# Patient Record
Sex: Male | Born: 1980 | Race: Black or African American | Hispanic: No | Marital: Married | Smoking: Never smoker
Health system: Southern US, Community
[De-identification: ages and names within clinical notes are randomized; demographics above are authoritative.]

## PROBLEM LIST (undated history)

## (undated) DIAGNOSIS — T7840XA Allergy, unspecified, initial encounter: Secondary | ICD-10-CM

## (undated) HISTORY — DX: Allergy, unspecified, initial encounter: T78.40XA

## (undated) HISTORY — PX: NECK SURGERY: SHX720

---

## 2006-09-23 ENCOUNTER — Emergency Department (HOSPITAL_COMMUNITY): Admission: EM | Admit: 2006-09-23 | Discharge: 2006-09-23 | Payer: Self-pay | Admitting: Emergency Medicine

## 2006-10-22 ENCOUNTER — Emergency Department (HOSPITAL_COMMUNITY): Admission: EM | Admit: 2006-10-22 | Discharge: 2006-10-22 | Payer: Self-pay | Admitting: Emergency Medicine

## 2008-01-15 ENCOUNTER — Emergency Department (HOSPITAL_COMMUNITY): Admission: EM | Admit: 2008-01-15 | Discharge: 2008-01-15 | Payer: Self-pay | Admitting: Emergency Medicine

## 2009-09-27 ENCOUNTER — Emergency Department (HOSPITAL_COMMUNITY)
Admission: EM | Admit: 2009-09-27 | Discharge: 2009-09-27 | Payer: Self-pay | Source: Home / Self Care | Admitting: Emergency Medicine

## 2010-10-19 ENCOUNTER — Emergency Department (HOSPITAL_COMMUNITY)
Admission: EM | Admit: 2010-10-19 | Discharge: 2010-10-19 | Payer: Self-pay | Source: Home / Self Care | Admitting: Emergency Medicine

## 2010-10-19 LAB — CBC
HCT: 42 % (ref 39.0–52.0)
Hemoglobin: 15.6 g/dL (ref 13.0–17.0)
Platelets: 170 10*3/uL (ref 150–400)
WBC: 10.3 10*3/uL (ref 4.0–10.5)

## 2010-10-19 LAB — BASIC METABOLIC PANEL
BUN: 10 mg/dL (ref 6–23)
CO2: 24 mEq/L (ref 19–32)
Calcium: 8.8 mg/dL (ref 8.4–10.5)
Creatinine, Ser: 0.93 mg/dL (ref 0.4–1.5)

## 2010-10-19 LAB — DIFFERENTIAL
Basophils Absolute: 0 10*3/uL (ref 0.0–0.1)
Eosinophils Relative: 0 % (ref 0–5)
Lymphocytes Relative: 8 % — ABNORMAL LOW (ref 12–46)
Monocytes Relative: 7 % (ref 3–12)

## 2010-12-10 LAB — CBC
MCHC: 34 g/dL (ref 30.0–36.0)
MCV: 88.5 fL (ref 78.0–100.0)
Platelets: 163 10*3/uL (ref 150–400)
WBC: 9.8 10*3/uL (ref 4.0–10.5)

## 2010-12-10 LAB — BASIC METABOLIC PANEL
BUN: 13 mg/dL (ref 6–23)
CO2: 22 mEq/L (ref 19–32)
Chloride: 102 mEq/L (ref 96–112)
Creatinine, Ser: 0.92 mg/dL (ref 0.4–1.5)
GFR calc Af Amer: >60 mL/min (ref >60)
Glucose, Bld: 128 mg/dL — ABNORMAL HIGH (ref 70–99)
Potassium: 3.9 mEq/L (ref 3.5–5.1)

## 2010-12-10 LAB — DIFFERENTIAL
Basophils Absolute: 0 10*3/uL (ref 0.0–0.1)
Basophils Relative: 0 % (ref 0–1)
Eosinophils Absolute: 0 10*3/uL (ref 0.0–0.7)
Eosinophils Relative: 0 % (ref 0–5)
Lymphs Abs: 0.4 10*3/uL — ABNORMAL LOW (ref 0.7–4.0)
Neutro Abs: 8.9 10*3/uL — ABNORMAL HIGH (ref 1.7–7.7)

## 2012-07-28 ENCOUNTER — Encounter (HOSPITAL_COMMUNITY): Payer: Self-pay | Admitting: Emergency Medicine

## 2012-07-28 DIAGNOSIS — M5380 Other specified dorsopathies, site unspecified: Secondary | ICD-10-CM | POA: Insufficient documentation

## 2012-07-28 NOTE — ED Notes (Signed)
PT. REPORTS UPPER / MID BACK PAIN ONSET THIS SAT. , STATES INJURED HIS BACK WHILE LIFTING FURNITURES .

## 2012-07-29 ENCOUNTER — Emergency Department (HOSPITAL_COMMUNITY)
Admission: EM | Admit: 2012-07-29 | Discharge: 2012-07-29 | Disposition: A | Discharge status: Home / Self Care | Payer: BC Managed Care – PPO | Attending: Emergency Medicine | Admitting: Emergency Medicine

## 2012-07-29 DIAGNOSIS — M62830 Muscle spasm of back: Secondary | ICD-10-CM

## 2012-07-29 MED ORDER — KETOROLAC TROMETHAMINE 30 MG/ML IJ SOLN
30.0000 mg | Freq: Once | INTRAMUSCULAR | Status: AC
  Administered 2012-07-29: 30 mg via INTRAMUSCULAR
  Filled 2012-07-29: qty 1

## 2012-07-29 MED ORDER — DIAZEPAM 5 MG PO TABS: 5.0000 mg | ORAL_TABLET | Freq: Four times a day (QID) | ORAL | Status: DC | PRN

## 2012-07-29 MED ORDER — IBUPROFEN 600 MG PO TABS: 600.0000 mg | ORAL_TABLET | Freq: Four times a day (QID) | ORAL | Status: DC | PRN

## 2012-07-29 MED ORDER — DIAZEPAM 5 MG PO TABS
5.0000 mg | ORAL_TABLET | Freq: Once | ORAL | Status: AC
  Administered 2012-07-29: 5 mg via ORAL
  Filled 2012-07-29: qty 1

## 2012-07-29 NOTE — ED Notes (Signed)
The patient is AOx4 and comfortable with his discharge instructions. 

## 2012-07-29 NOTE — ED Provider Notes (Signed)
Medical screening examination/treatment/procedure(s) were performed by non-physician practitioner and as supervising physician I was immediately available for consultation/collaboration.    Vida Roller, MD 07/29/12 [phone]

## 2012-07-29 NOTE — ED Provider Notes (Signed)
History     CSN: [id number]  Arrival date & time 07/28/12  2326   First MD Initiated Contact with Patient 07/29/12 [phone]      Chief Complaint  Patient presents with  . Back Pain    (Consider location/radiation/quality/duration/timing/severity/associated sxs/prior treatment) HPI Comments: Patient states, that after moving furniture on Saturday, he developed upper back pain across his shoulder blades and mid back.  Has been persistent.  He is been taking over-the-counter ibuprofen, with no relief.  Denies any previous injury of back pain, shortness of breath, nausea, dizziness, cardiac arrhythmias  Patient is a 31 y.o. male presenting with back pain. The history is provided by the patient.  Back Pain  This is a new problem. The current episode started 2 days ago. The problem occurs constantly. The problem has not changed since onset.The pain is associated with lifting heavy objects. The pain is present in the thoracic spine. The pain does not radiate. The pain is at a severity of 8/10. The pain is moderate. The symptoms are aggravated by certain positions. Pertinent negatives include no chest pain, no fever, no numbness and no weakness.    History reviewed. No pertinent past medical history.  History reviewed. No pertinent past surgical history.  No family history on file.  History  Substance Use Topics  . Smoking status: Never Smoker   . Smokeless tobacco: Not on file  . Alcohol Use: No      Review of Systems  Constitutional: Negative for fever and chills.  HENT: Negative for congestion.   Respiratory: Negative for shortness of breath.   Cardiovascular: Negative for chest pain.  Gastrointestinal: Negative for nausea.  Musculoskeletal: Positive for back pain.  Skin: Negative for rash and wound.  Neurological: Negative for dizziness, weakness and numbness.    Allergies  Review of patient's allergies indicates no known allergies.  Home Medications   Current Outpatient  Rx  Name  Route  Sig  Dispense  Refill  . ADVIL PO   Oral   Take 2 tablets by mouth every 6 (six) hours as needed. For back pain         . DIAZEPAM 5 MG PO TABS   Oral   Take 1 tablet (5 mg total) by mouth every 6 (six) hours as needed for anxiety.   20 tablet   0   . IBUPROFEN 600 MG PO TABS   Oral   Take 1 tablet (600 mg total) by mouth every 6 (six) hours as needed for pain.   30 tablet   0     BP 117/70  Pulse 64  Temp 97.8 F (36.6 C) (Oral)  Resp 16  SpO2 99%  Physical Exam  Constitutional: He is oriented to person, place, and time. He appears well-developed and well-nourished.  Eyes: Pupils are equal, round, and reactive to light.  Cardiovascular: Normal rate.   Pulmonary/Chest: Effort normal.  Musculoskeletal: Normal range of motion. He exhibits tenderness. He exhibits no edema.  Neurological: He is alert and oriented to person, place, and time.  Skin: Skin is warm. No rash noted. No erythema.    ED Course  Procedures (including critical care time)  Labs Reviewed - No data to display No results found.   1. Muscle spasm of back       MDM   Patient had a severe.  Muscle spasm.  We'll treat with Valium, and anti-inflammatories        Arman Filter, NP 07/29/12 [phone]

## 2012-09-10 ENCOUNTER — Encounter (HOSPITAL_COMMUNITY): Payer: Self-pay | Admitting: *Deleted

## 2012-09-10 ENCOUNTER — Emergency Department (HOSPITAL_COMMUNITY)
Admission: EM | Admit: 2012-09-10 | Discharge: 2012-09-10 | Disposition: A | Discharge status: Home / Self Care | Payer: BC Managed Care – PPO | Attending: Emergency Medicine | Admitting: Emergency Medicine

## 2012-09-10 DIAGNOSIS — B9789 Other viral agents as the cause of diseases classified elsewhere: Secondary | ICD-10-CM | POA: Insufficient documentation

## 2012-09-10 DIAGNOSIS — R05 Cough: Secondary | ICD-10-CM | POA: Insufficient documentation

## 2012-09-10 DIAGNOSIS — R6883 Chills (without fever): Secondary | ICD-10-CM | POA: Insufficient documentation

## 2012-09-10 DIAGNOSIS — R51 Headache: Secondary | ICD-10-CM | POA: Insufficient documentation

## 2012-09-10 DIAGNOSIS — B349 Viral infection, unspecified: Secondary | ICD-10-CM

## 2012-09-10 DIAGNOSIS — R52 Pain, unspecified: Secondary | ICD-10-CM | POA: Insufficient documentation

## 2012-09-10 DIAGNOSIS — R059 Cough, unspecified: Secondary | ICD-10-CM | POA: Insufficient documentation

## 2012-09-10 DIAGNOSIS — IMO0001 Reserved for inherently not codable concepts without codable children: Secondary | ICD-10-CM | POA: Insufficient documentation

## 2012-09-10 DIAGNOSIS — J029 Acute pharyngitis, unspecified: Secondary | ICD-10-CM | POA: Insufficient documentation

## 2012-09-10 MED ORDER — ONDANSETRON HCL 4 MG PO TABS: 4.0000 mg | ORAL_TABLET | Freq: Four times a day (QID) | ORAL | Status: DC

## 2012-09-10 MED ORDER — SODIUM CHLORIDE 0.9 % IV BOLUS (SEPSIS)
1000.0000 mL | Freq: Once | INTRAVENOUS | Status: AC
  Administered 2012-09-10: 1000 mL via INTRAVENOUS

## 2012-09-10 MED ORDER — KETOROLAC TROMETHAMINE 30 MG/ML IJ SOLN
30.0000 mg | Freq: Once | INTRAMUSCULAR | Status: AC
  Administered 2012-09-10: 30 mg via INTRAVENOUS
  Filled 2012-09-10: qty 1

## 2012-09-10 MED ORDER — ONDANSETRON HCL 4 MG/2ML IJ SOLN
4.0000 mg | Freq: Once | INTRAMUSCULAR | Status: AC
  Administered 2012-09-10: 4 mg via INTRAVENOUS
  Filled 2012-09-10: qty 2

## 2012-09-10 MED ORDER — ACETAMINOPHEN 325 MG PO TABS
650.0000 mg | ORAL_TABLET | Freq: Once | ORAL | Status: AC
  Administered 2012-09-10: 650 mg via ORAL
  Filled 2012-09-10: qty 2

## 2012-09-10 MED ORDER — MORPHINE SULFATE 4 MG/ML IJ SOLN
2.0000 mg | Freq: Once | INTRAMUSCULAR | Status: AC
  Administered 2012-09-10: 2 mg via INTRAVENOUS
  Filled 2012-09-10: qty 1

## 2012-09-10 NOTE — ED Provider Notes (Signed)
History     CSN: [id number]  Arrival date & time 09/10/12  0108   First MD Initiated Contact with Patient 09/10/12 0216      Chief Complaint  Patient presents with  . Emesis  . Generalized Body Aches    (Consider location/radiation/quality/duration/timing/severity/associated sxs/prior treatment) HPI Zachary Fischer is a 31 y.o. male who presents to the Emergency Department complaining of chills, cough, headache, sore throat, nausea and one episide of vomiting that began tonight. He also reports body aches and pains. Denies fever, shortness of breath. Has taken no medicines.   History reviewed. No pertinent past medical history.  History reviewed. No pertinent past surgical history.  History reviewed. No pertinent family history.  History  Substance Use Topics  . Smoking status: Never Smoker   . Smokeless tobacco: Not on file  . Alcohol Use: No      Review of Systems  Constitutional: Positive for chills. Negative for fever.       10 Systems reviewed and are negative for acute change except as noted in the HPI.  HENT: Negative for congestion.   Eyes: Negative for discharge and redness.  Respiratory: Negative for cough and shortness of breath.   Cardiovascular: Negative for chest pain.  Gastrointestinal: Positive for nausea and vomiting. Negative for abdominal pain.  Musculoskeletal: Positive for myalgias. Negative for back pain.  Skin: Negative for rash.  Neurological: Positive for headaches. Negative for syncope and numbness.  Psychiatric/Behavioral:       No behavior change.    Allergies  Review of patient's allergies indicates no known allergies.  Home Medications   Current Outpatient Rx  Name  Route  Sig  Dispense  Refill  . DIAZEPAM 5 MG PO TABS   Oral   Take 1 tablet (5 mg total) by mouth every 6 (six) hours as needed for anxiety.   20 tablet   0   . ADVIL PO   Oral   Take 2 tablets by mouth every 6 (six) hours as needed. For back pain          . IBUPROFEN 600 MG PO TABS   Oral   Take 1 tablet (600 mg total) by mouth every 6 (six) hours as needed for pain.   30 tablet   0     BP 90/40  Pulse 102  Temp 100.3 F (37.9 C) (Oral)  Resp 20  Ht 5\' 8"  (1.727 m)  Wt 150 lb (68.04 kg)  BMI 22.81 kg/m2  SpO2 99%  Physical Exam  Nursing note and vitals reviewed. Constitutional: He appears well-developed and well-nourished.       Awake, alert, nontoxic appearance.  HENT:  Head: Atraumatic.  Eyes: Right eye exhibits no discharge. Left eye exhibits no discharge.  Neck: Neck supple.  Cardiovascular: Normal heart sounds and intact distal pulses.   Pulmonary/Chest: Effort normal and breath sounds normal. He exhibits no tenderness.  Abdominal: Soft. Bowel sounds are normal. There is no tenderness. There is no rebound.  Musculoskeletal: He exhibits no tenderness.       Baseline ROM, no obvious new focal weakness.  Neurological:       Mental status and motor strength appears baseline for patient and situation.  Skin: No rash noted.  Psychiatric: He has a normal mood and affect.    ED Course  Procedures (including critical care time)    1. Viral illness       MDM  Patient presents with myalgias, headache, nausea, vomiting, chills. Given IVF,  antiinflammatory, antiemetic, analgesic with improvement. Able to take PO fluids and a snack. Pt feels improved after observation and/or treatment in ED.Pt stable in ED with no significant deterioration in condition.The patient appears reasonably screened and/or stabilized for discharge and I doubt any other medical condition or other [facility] requiring further screening, evaluation, or treatment in the ED at this time prior to discharge.  MDM Reviewed: nursing note and vitals           Nicoletta Dress. Colon Branch, MD 09/11/12 2337

## 2012-09-10 NOTE — ED Notes (Signed)
Gave patient ginger ale to drink as requested and ordered by MD.

## 2012-09-10 NOTE — ED Notes (Signed)
Pt reporting generalized body aches, cold chills, and vomiting beginning last night.  Symptoms progressing though day.  Pt has been treating at home with Nyquil and Ibuprofen.

## 2013-05-29 ENCOUNTER — Encounter: Payer: Self-pay | Admitting: Family Medicine

## 2013-05-29 ENCOUNTER — Ambulatory Visit (INDEPENDENT_AMBULATORY_CARE_PROVIDER_SITE_OTHER): Payer: BC Managed Care – PPO | Admitting: Family Medicine

## 2013-05-29 VITALS — BP 100/70 | HR 68 | Temp 97.0°F | Resp 18 | Ht 66.0 in | Wt 162.0 lb

## 2013-05-29 DIAGNOSIS — Z0000 Encounter for general adult medical examination without abnormal findings: Secondary | ICD-10-CM

## 2013-05-29 LAB — COMPREHENSIVE METABOLIC PANEL
ALT: 30 U/L (ref 0–53)
AST: 36 U/L (ref 0–37)
Albumin: 4.6 g/dL (ref 3.5–5.2)
Alkaline Phosphatase: 65 U/L (ref 39–117)
CO2: 26 mEq/L (ref 19–32)
Creat: 1 mg/dL (ref 0.50–1.35)
Glucose, Bld: 77 mg/dL (ref 70–99)
Total Bilirubin: 0.5 mg/dL (ref 0.3–1.2)
Total Protein: 7.1 g/dL (ref 6.0–8.3)

## 2013-05-29 LAB — CBC WITH DIFFERENTIAL/PLATELET
Basophils Absolute: 0 10*3/uL (ref 0.0–0.1)
Basophils Relative: 0 % (ref 0–1)
Eosinophils Relative: 3 % (ref 0–5)
Lymphs Abs: 1.3 10*3/uL (ref 0.7–4.0)
MCHC: 35.5 g/dL (ref 30.0–36.0)
MCV: 86 fL (ref 78.0–100.0)
RBC: 4.72 MIL/uL (ref 4.22–5.81)
RDW: 14 % (ref 11.5–15.5)
WBC: 5.1 10*3/uL (ref 4.0–10.5)

## 2013-05-29 LAB — LIPID PANEL
LDL Cholesterol: 95 mg/dL (ref 0–99)
Total CHOL/HDL Ratio: 3.4 Ratio
Triglycerides: 49 mg/dL (ref <150)
VLDL: 10 mg/dL (ref 0–40)

## 2013-05-29 NOTE — Progress Notes (Signed)
  Subjective:    Patient ID: Zachary Fischer, male    DOB: Jun 18, 1981, 32 y.o.   MRN: [id number]  HPI  Pt here to establish care and for complete for physical exam. No previous PCP. TDAP 2009 from [employer]  Works as Financial planner [employer] History reviewed Due for fasting labs Occasionally gets bilat knee pain, no injury, overuse in [employer], no swelling, uses OTC meds if needed  Review of Systems   GEN- denies fatigue, fever, weight loss,weakness, recent illness HEENT- denies eye drainage, change in vision, nasal discharge, CVS- denies chest pain, palpitations RESP- denies SOB, cough, wheeze ABD- denies N/V, change in stools, abd pain GU- denies dysuria, hematuria, dribbling, incontinence MSK- denies joint pain, muscle aches, injury Neuro- denies headache, dizziness, syncope, seizure activity      Objective:   Physical Exam  GEN- NAD, alert and oriented x3 HEENT- PERRL, EOMI, non injected sclera, pink conjunctiva, MMM, oropharynx clear, TM clear bilat, no effusion Neck- Supple, no LAD CVS- RRR, no murmur RESP-CTAB ABD-NABS,soft,NT,ND EXT- No edema MSK- FROM upper and lower ext, mild crepitus bilat knees, no effusion NEURO-CNII-XII in tact, no focal deficits Pulses- Radial, DP- 2+ Psych- normal affect and mood      Assessment & Plan:   CPE- Physical done, Fasting labs, Immunizations UTD,RTC as needed

## 2013-05-29 NOTE — Patient Instructions (Signed)
I recommend eye visit once a year I recommend dental visit every 6 months Goal is to  Exercise 30 minutes 5 days a week We will send a letter with lab results  F/U 1 year or as needed  

## 2014-06-11 ENCOUNTER — Other Ambulatory Visit (INDEPENDENT_AMBULATORY_CARE_PROVIDER_SITE_OTHER): Payer: BC Managed Care – PPO

## 2014-06-11 DIAGNOSIS — Z111 Encounter for screening for respiratory tuberculosis: Secondary | ICD-10-CM

## 2014-06-11 DIAGNOSIS — Z0000 Encounter for general adult medical examination without abnormal findings: Secondary | ICD-10-CM

## 2014-06-11 LAB — CBC WITH DIFFERENTIAL/PLATELET
BASOS ABS: 0 10*3/uL (ref 0.0–0.1)
BASOS PCT: 0 % (ref 0–1)
EOS ABS: 0.1 10*3/uL (ref 0.0–0.7)
EOS PCT: 3 % (ref 0–5)
HCT: 41.5 % (ref 39.0–52.0)
Hemoglobin: 14.5 g/dL (ref 13.0–17.0)
LYMPHS PCT: 27 % (ref 12–46)
Lymphs Abs: 1.2 10*3/uL (ref 0.7–4.0)
MCH: 29.8 pg (ref 26.0–34.0)
MCHC: 34.9 g/dL (ref 30.0–36.0)
MCV: 85.4 fL (ref 78.0–100.0)
MONO ABS: 0.5 10*3/uL (ref 0.1–1.0)
Monocytes Relative: 11 % (ref 3–12)
NEUTROS PCT: 59 % (ref 43–77)
Neutro Abs: 2.7 10*3/uL (ref 1.7–7.7)
Platelets: 166 10*3/uL (ref 150–400)
RBC: 4.86 MIL/uL (ref 4.22–5.81)
RDW: 13.8 % (ref 11.5–15.5)
WBC: 4.5 10*3/uL (ref 4.0–10.5)

## 2014-06-11 LAB — COMPLETE METABOLIC PANEL WITH GFR
ALT: 21 U/L (ref 0–53)
AST: 18 U/L (ref 0–37)
Albumin: 4.3 g/dL (ref 3.5–5.2)
Alkaline Phosphatase: 75 U/L (ref 39–117)
BILIRUBIN TOTAL: 0.4 mg/dL (ref 0.2–1.2)
BUN: 12 mg/dL (ref 6–23)
CALCIUM: 9.1 mg/dL (ref 8.4–10.5)
CHLORIDE: 102 meq/L (ref 96–112)
CO2: 28 meq/L (ref 19–32)
CREATININE: 1.13 mg/dL (ref 0.50–1.35)
GFR, Est Non African American: 86 mL/min
Glucose, Bld: 81 mg/dL (ref 70–99)
Potassium: 4.2 mEq/L (ref 3.5–5.3)
Sodium: 140 mEq/L (ref 135–145)
TOTAL PROTEIN: 6.7 g/dL (ref 6.0–8.3)

## 2014-06-11 LAB — LIPID PANEL
CHOL/HDL RATIO: 3.3 ratio
Cholesterol: 136 mg/dL (ref 0–200)
HDL: 41 mg/dL (ref >39)
LDL CALC: 81 mg/dL (ref 0–99)
TRIGLYCERIDES: 71 mg/dL (ref <150)
VLDL: 14 mg/dL (ref 0–40)

## 2014-06-11 NOTE — Progress Notes (Signed)
Patient in office to have labs obtained and for PPD testing.   PPD administered and patient tolerated procedure well.

## 2014-06-14 ENCOUNTER — Ambulatory Visit (INDEPENDENT_AMBULATORY_CARE_PROVIDER_SITE_OTHER): Payer: BC Managed Care – PPO | Admitting: Family Medicine

## 2014-06-14 ENCOUNTER — Encounter: Payer: Self-pay | Admitting: Family Medicine

## 2014-06-14 VITALS — BP 102/64 | HR 64 | Temp 98.0°F | Resp 12 | Ht 68.0 in | Wt 170.0 lb

## 2014-06-14 DIAGNOSIS — M25559 Pain in unspecified hip: Secondary | ICD-10-CM

## 2014-06-14 DIAGNOSIS — M25551 Pain in right hip: Secondary | ICD-10-CM

## 2014-06-14 DIAGNOSIS — M79609 Pain in unspecified limb: Secondary | ICD-10-CM

## 2014-06-14 DIAGNOSIS — M79642 Pain in left hand: Secondary | ICD-10-CM

## 2014-06-14 DIAGNOSIS — Z0000 Encounter for general adult medical examination without abnormal findings: Secondary | ICD-10-CM

## 2014-06-14 DIAGNOSIS — M79641 Pain in right hand: Secondary | ICD-10-CM

## 2014-06-14 DIAGNOSIS — Z23 Encounter for immunization: Secondary | ICD-10-CM

## 2014-06-14 LAB — TB SKIN TEST
Induration: 0 mm
TB Skin Test: NEGATIVE

## 2014-06-14 LAB — SEDIMENTATION RATE: Sed Rate: 1 mm/hr (ref 0–16)

## 2014-06-14 LAB — RHEUMATOID FACTOR: Rhuematoid fact SerPl-aCnc: <10 IU/mL (ref <=14)

## 2014-06-14 MED ORDER — MELOXICAM 7.5 MG PO TABS: 7.5000 mg | ORAL_TABLET | Freq: Every day | ORAL | Status: DC

## 2014-06-14 NOTE — Patient Instructions (Signed)
Get the xrays done I will call with lab results Take the meloxicam once a day  F/U pending results

## 2014-06-15 LAB — ANA: Anti Nuclear Antibody(ANA): NEGATIVE

## 2014-06-15 NOTE — Assessment & Plan Note (Signed)
CPE done, flu shot given, TDAP UTD

## 2014-06-15 NOTE — Progress Notes (Signed)
Patient ID: Zachary Fischer, male   DOB: 09-14-1981, 33 y.o.   MRN: [id number]   Subjective:    Patient ID: Zachary Fischer, male    DOB: 19-Sep-1981, 33 y.o.   MRN: [id number]  Patient presents for CPE, R hand knuckle pain and Hip Pain  patient here for complete physical exam. 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 He also complains of right hip pain states when he sits [place] he has increased pain in his hip or any time he tries to turn it. He's not had any particular injury to his hip denies any back pain. He denies any hernia.  Fasting labs reviewed  No family history of Rheumatoid Arthritis  Review Of Systems:  GEN- denies fatigue, fever, weight loss,weakness, recent illness HEENT- denies eye drainage, change in vision, nasal discharge, CVS- denies chest pain, palpitations RESP- denies SOB, cough, wheeze ABD- denies N/V, change in stools, abd pain GU- denies dysuria, hematuria, dribbling, incontinence MSK- + joint pain, muscle aches, injury Neuro- denies headache, dizziness, syncope, seizure activity       Objective:    BP 102/64  Pulse 64  Temp(Src) 98 F (36.7 C) (Oral)  Resp 12  Ht _0  (1.727 m)  Wt 170 lb (77.111 kg)  BMI 25.85 kg/m2 GEN- NAD, alert and oriented x3 HEENT- PERRL, EOMI, non injected sclera, pink conjunctiva, MMM, oropharynx clear, TM clear bilat no effusion Neck- Supple, From CVS- RRR, no murmur RESP-CTAB ABD-NABS,soft,NT,ND MSK- Right hand- mild swelling of MIP with small nodule noted on 3rd and 4th digits, able to make fist bilat, no deformity noted of left hand, grasp normal  Spine NT, FROM spine, Bilat Hip, neg hip rock, Pain wit IR of RIght hip, no siginificant leg length discrepance EXT- No edema Pulses- Radial, DP- 2+        Assessment & Plan:      Problem List  Items Addressed This Visit   Routine general medical examination at a health care facility - Primary     CPE done, flu shot given, TDAP UTD     Other Visit Diagnoses   Hip pain, acute, right        xray of hip, NSAIDS    Relevant Orders       DG Hip Complete Right    Bilateral hand pain        nodules noted at MIP, obtain ESR, RF, ANA, Xray of hands, may need rheumatology     Relevant Orders       DG Hand Complete Right       DG Hand Complete Left       Rheumatoid factor (Completed)       ANA (Completed)       Sedimentation Rate (Completed)    Need for prophylactic vaccination and inoculation against influenza           Note: This dictation was prepared with Dragon dictation along with smaller phrase technology. Any transcriptional errors that result from this process are unintentional.

## 2014-06-16 ENCOUNTER — Ambulatory Visit
Admission: RE | Admit: 2014-06-16 | Discharge: 2014-06-16 | Disposition: A | Discharge status: Home / Self Care | Payer: BC Managed Care – PPO | Source: Ambulatory Visit | Attending: Family Medicine | Admitting: Family Medicine

## 2014-06-16 DIAGNOSIS — M79642 Pain in left hand: Principal | ICD-10-CM

## 2014-06-16 DIAGNOSIS — M79641 Pain in right hand: Secondary | ICD-10-CM

## 2014-06-16 DIAGNOSIS — M25551 Pain in right hip: Secondary | ICD-10-CM

## 2015-09-07 ENCOUNTER — Encounter: Payer: BC Managed Care – PPO | Admitting: Family Medicine

## 2015-12-26 ENCOUNTER — Emergency Department (HOSPITAL_COMMUNITY)
Admission: EM | Admit: 2015-12-26 | Discharge: 2015-12-26 | Disposition: A | Discharge status: Home / Self Care | Payer: BC Managed Care – PPO | Attending: Emergency Medicine | Admitting: Emergency Medicine

## 2015-12-26 ENCOUNTER — Encounter (HOSPITAL_COMMUNITY): Payer: Self-pay | Admitting: Emergency Medicine

## 2015-12-26 DIAGNOSIS — D170 Benign lipomatous neoplasm of skin and subcutaneous tissue of head, face and neck: Secondary | ICD-10-CM | POA: Diagnosis not present

## 2015-12-26 DIAGNOSIS — D179 Benign lipomatous neoplasm, unspecified: Secondary | ICD-10-CM

## 2015-12-26 DIAGNOSIS — H9201 Otalgia, right ear: Secondary | ICD-10-CM

## 2015-12-26 MED ORDER — LIDOCAINE HCL (PF) 2 % IJ SOLN
2.0000 mL | Freq: Once | INTRAMUSCULAR | Status: AC
  Administered 2015-12-26: 2 mL

## 2015-12-26 MED ORDER — LIDOCAINE HCL (PF) 2 % IJ SOLN
INTRAMUSCULAR | Status: AC
  Administered 2015-12-26: 2 mL
  Filled 2015-12-26: qty 10

## 2015-12-26 NOTE — ED Notes (Signed)
Pt c/o left ear pain for a couple of weeks and a knot to the back of the neck for a while.

## 2015-12-26 NOTE — Discharge Instructions (Signed)
Lipoma A lipoma is a noncancerous (benign) tumor that is made up of fat cells. This is a very common type of soft-tissue growth. Lipomas are usually found under the skin (subcutaneous). They may occur in any tissue of the body that contains fat. Common areas for lipomas to appear include the back, shoulders, buttocks, and thighs. Lipomas grow slowly, and they are usually painless. Most lipomas do not cause problems and do not require treatment. CAUSES The cause of this condition is not known. RISK FACTORS This condition is more likely to develop in:  People who are 96-43 years old.  People who have a family history of lipomas. SYMPTOMS A lipoma usually appears as a small, round bump under the skin. It may feel soft or rubbery, but the firmness can vary. Most lipomas are not painful. However, a lipoma may become painful if it is located in an area where it pushes on nerves. DIAGNOSIS A lipoma can usually be diagnosed with a physical exam. You may also have tests to confirm the diagnosis and to rule out other conditions. Tests may include:  Imaging tests, such as a CT scan or MRI.  Removal of a tissue sample to be looked at under a microscope (biopsy). TREATMENT Treatment is not needed for small lipomas that are not causing problems. If a lipoma continues to get bigger or it causes problems, removal is often the best option. Lipomas can also be removed to improve appearance. Removal of a lipoma is usually done with a surgery in which the fatty cells and the surrounding capsule are removed. Most often, a medicine that numbs the area (local anesthetic) is used for this procedure. HOME CARE INSTRUCTIONS  Keep all follow-up visits as directed by your health care provider. This is important. SEEK MEDICAL CARE IF:  Your lipoma becomes larger or hard.  Your lipoma becomes painful, red, or increasingly swollen. These could be signs of infection or a more serious condition.   This information is  not intended to replace advice given to you by your health care provider. Make sure you discuss any questions you have with your health care provider.   Document Released: 08/31/2002 Document Revised: 01/25/2015 Document Reviewed: 09/06/2014 Elsevier Interactive Patient Education 2016 [place] An earache, also called otalgia, can be caused by many things. Pain from an earache can be sharp, dull, or burning. The pain may be temporary or constant. Earaches can be caused by problems with the ear, such as infection in either the middle ear or the ear canal, injury, impacted ear wax, middle ear pressure, or a foreign body in the ear. Ear pain can also result from problems in other areas. This is called referred pain. For example, pain can come from a sore throat, a tooth infection, or problems with the jaw or the joint between the jaw and the skull (temporomandibular joint, or TMJ). The cause of an earache is not always easy to identify. Watchful waiting may be appropriate for some earaches until a clear cause of the pain can be found. HOME CARE INSTRUCTIONS Watch your condition for any changes. The following actions may help to lessen any discomfort that you are feeling:  Take medicines only as directed by your health care provider. This includes ear drops.  Apply ice to your outer ear to help reduce pain.  Put ice in a plastic bag.  Place a towel between your skin and the bag.  Leave the ice on for 20 minutes, 2-3 times per day.  Do not put anything in your ear other than medicine that is prescribed by your health care provider.  Try resting in an upright position instead of lying down. This may help to reduce pressure in the middle ear and relieve pain.  Chew gum if it helps to relieve your ear pain.  Control any allergies that you have.  Keep all follow-up visits as directed by your health care provider. This is important. SEEK MEDICAL CARE IF:  Your pain does not  improve within 2 days.  You have a fever.  You have new or worsening symptoms. SEEK IMMEDIATE MEDICAL CARE IF:  You have a severe headache.  You have a stiff neck.  You have difficulty swallowing.  You have redness or swelling behind your ear.  You have drainage from your ear.  You have hearing loss.  You feel dizzy.   This information is not intended to replace advice given to you by your health care provider. Make sure you discuss any questions you have with your health care provider.   Document Released: 04/27/2004 Document Revised: 10/01/2014 Document Reviewed: 04/11/2014 Elsevier Interactive Patient Education [employer].   As discussed continue using your sudafed in addition to menthol cough drops or altoid mints or any strong mint that can help with nasal and ear congestion.

## 2015-12-28 NOTE — ED Provider Notes (Signed)
CSN: [id number]     Arrival date & time 12/26/15  2105 History   First MD Initiated Contact with Patient 12/26/15 2229     Chief Complaint  Patient presents with  . Otalgia     (Consider location/radiation/quality/duration/timing/severity/associated sxs/prior Treatment) Patient is a 35 y.o. male presenting with ear pain. The history is provided by the patient.  Otalgia Location:  Right Quality:  Aching and throbbing Severity:  Moderate Onset quality:  Gradual Duration:  2 weeks Timing:  Intermittent Progression:  Unchanged Chronicity:  New Context: not direct blow, not elevation change, not foreign body in ear and not loud noise   Relieved by:  None tried Worsened by:  Nothing tried Ineffective treatments:  None tried Associated symptoms: no abdominal pain, no congestion, no cough, no ear discharge, no fever, no headaches, no hearing loss, no neck pain, no rash, no rhinorrhea, no sore throat and no tinnitus    Additionally has a non tender "knot" on his posterior neck which has slowly become larger over the past 6 months.  There has been no drainage from this site. He has had no interventions, has found no alleviators for this problem.  He has not seen his pcp for this problem.  Past Medical History  Diagnosis Date  . Allergy    History reviewed. No pertinent past surgical history. Family History  Problem Relation Age of Onset  . Cancer Mother     stage 4 breast cancer  . Diabetes Mother   . Stroke Maternal Grandmother   . Heart disease Maternal Grandmother   . Kidney disease Maternal Grandmother   . Hypertension Maternal Grandmother   . Diabetes Maternal Grandmother   . Cancer Maternal Grandfather     colon  . Hypertension Paternal Grandmother   . Hypertension Paternal Grandfather   . Cancer Paternal Grandfather     colon cancer  . Healthy Brother   . Healthy Sister    Social History  Substance Use Topics  . Smoking status: Never Smoker   . Smokeless tobacco:  None  . Alcohol Use: No    Review of Systems  Constitutional: Negative for fever and chills.  HENT: Positive for ear pain. Negative for congestion, ear discharge, hearing loss, rhinorrhea, sinus pressure, sore throat, tinnitus, trouble swallowing and voice change.   Eyes: Negative for discharge.  Respiratory: Negative for cough, shortness of breath, wheezing and stridor.   Cardiovascular: Negative for chest pain.  Gastrointestinal: Negative for abdominal pain.  Genitourinary: Negative.   Musculoskeletal: Negative for neck pain.  Skin: Negative for rash.  Neurological: Negative for headaches.      Allergies  Review of patient's allergies indicates no known allergies.  Home Medications   Prior to Admission medications   Not on File   BP 120/79 mmHg  Pulse 65  Temp(Src) 98 F (36.7 C) (Oral)  Resp 20  Ht 5\' 8"  (1.727 m)  Wt 72.576 kg  BMI 24.33 kg/m2  SpO2 98% Physical Exam  Constitutional: He appears well-developed and well-nourished.  HENT:  Head: Normocephalic and atraumatic.  Right Ear: Tympanic membrane, external ear and ear canal normal. No drainage. No mastoid tenderness. Tympanic membrane is not injected, not erythematous, not retracted and not bulging. No middle ear effusion. No decreased hearing is noted.  Left Ear: Tympanic membrane, external ear and ear canal normal. No drainage. No mastoid tenderness. Tympanic membrane is not injected, not erythematous, not retracted and not bulging.  No middle ear effusion. No decreased hearing is noted.  Nose: Nose normal.  Mouth/Throat: Uvula is midline and oropharynx is clear and moist.  Normal ear exam.  Teeth healthy appearance without decay.  Eyes: Conjunctivae are normal.  Neck: Normal range of motion.  Cardiovascular: Normal rate, regular rhythm, normal heart sounds and intact distal pulses.   Pulmonary/Chest: Effort normal and breath sounds normal. He has no wheezes.  Abdominal: Soft. Bowel sounds are normal. There  is no tenderness.  Musculoskeletal: Normal range of motion.  Neurological: He is alert.  Skin: Skin is warm and dry.  2 cm raised nodule posterior neck within the hairline in the cutaneous layer. Soft, mobile.  No erythema. No drainage.  Psychiatric: He has a normal mood and affect.  Nursing note and vitals reviewed.   ED Course  .Marland KitchenIncision and Drainage Date/Time: 12/26/2015 11:20 PM Performed by: Evalee Jefferson Authorized by: Evalee Jefferson Consent: Verbal consent obtained. Risks and benefits: risks, benefits and alternatives were discussed Consent given by: patient Patient identity confirmed: verbally with patient Time out: Immediately prior to procedure a "time out" was called to verify the correct patient, procedure, equipment, support staff and site/side marked as required. Type: cyst Body area: neck (midline posterior neck/scalp) Anesthesia: local infiltration Local anesthetic: lidocaine 2% without epinephrine Anesthetic total: 2 ml Needle gauge: 18 Comments: Needle aspiration performed with no drainage.   (including critical care time) Labs Review Labs Reviewed - No data to display  Imaging Review No results found. I have personally reviewed and evaluated these images and lab results as part of my medical decision-making.   EKG Interpretation None      MDM   Final diagnoses:  Otalgia of right ear  Lipoma    Right earache with normal exam.  Pt does not have any neurological sx, no mastoid tenderness and has normal ear exam. Referred to Dr. Benjamine Mola for further eval if sx persist.  Suspect lesion on neck is lipoma, not the sebaceous cyst it was first felt to be.  He was advised prn f/u if it causes sx, could consider surgical resection.  Prn f/u anticipated.   The patient appears reasonably screened and/or stabilized for discharge and I doubt any other medical condition or other [facility] requiring further screening, evaluation, or treatment in the ED at this time prior to  discharge.     Evalee Jefferson, PA-C 12/28/15 [id number]  Merrily Pew, MD 12/29/15 1016

## 2016-02-08 ENCOUNTER — Emergency Department (HOSPITAL_COMMUNITY)
Admission: EM | Admit: 2016-02-08 | Discharge: 2016-02-08 | Disposition: A | Discharge status: Home / Self Care | Payer: BC Managed Care – PPO | Attending: Emergency Medicine | Admitting: Emergency Medicine

## 2016-02-08 ENCOUNTER — Emergency Department (HOSPITAL_COMMUNITY): Payer: BC Managed Care – PPO

## 2016-02-08 ENCOUNTER — Encounter (HOSPITAL_COMMUNITY): Payer: Self-pay

## 2016-02-08 DIAGNOSIS — R000 Tachycardia, unspecified: Secondary | ICD-10-CM | POA: Insufficient documentation

## 2016-02-08 DIAGNOSIS — R519 Headache, unspecified: Secondary | ICD-10-CM

## 2016-02-08 DIAGNOSIS — J40 Bronchitis, not specified as acute or chronic: Secondary | ICD-10-CM | POA: Diagnosis not present

## 2016-02-08 DIAGNOSIS — J209 Acute bronchitis, unspecified: Secondary | ICD-10-CM

## 2016-02-08 DIAGNOSIS — J9801 Acute bronchospasm: Secondary | ICD-10-CM | POA: Diagnosis not present

## 2016-02-08 DIAGNOSIS — R51 Headache: Secondary | ICD-10-CM | POA: Insufficient documentation

## 2016-02-08 DIAGNOSIS — J029 Acute pharyngitis, unspecified: Secondary | ICD-10-CM | POA: Diagnosis present

## 2016-02-08 DIAGNOSIS — L299 Pruritus, unspecified: Secondary | ICD-10-CM | POA: Insufficient documentation

## 2016-02-08 MED ORDER — AZITHROMYCIN 250 MG PO TABS: 250.0000 mg | ORAL_TABLET | Freq: Every day | ORAL | Status: DC

## 2016-02-08 MED ORDER — PREDNISONE 10 MG PO TABS: 20.0000 mg | ORAL_TABLET | Freq: Two times a day (BID) | ORAL | Status: DC

## 2016-02-08 MED ORDER — IPRATROPIUM-ALBUTEROL 0.5-2.5 (3) MG/3ML IN SOLN
3.0000 mL | Freq: Once | RESPIRATORY_TRACT | Status: AC
  Administered 2016-02-08: 3 mL via RESPIRATORY_TRACT
  Filled 2016-02-08: qty 3

## 2016-02-08 MED ORDER — GUAIFENESIN-CODEINE 100-10 MG/5ML PO SYRP: 5.0000 mL | ORAL_SOLUTION | Freq: Three times a day (TID) | ORAL | Status: DC | PRN

## 2016-02-08 MED ORDER — HYDROCOD POLST-CPM POLST ER 10-8 MG/5ML PO SUER
5.0000 mL | Freq: Once | ORAL | Status: AC
  Administered 2016-02-08: 5 mL via ORAL
  Filled 2016-02-08: qty 5

## 2016-02-08 MED ORDER — ALBUTEROL SULFATE HFA 108 (90 BASE) MCG/ACT IN AERS
2.0000 | INHALATION_SPRAY | RESPIRATORY_TRACT | Status: DC | PRN
  Filled 2016-02-08: qty 6.7

## 2016-02-08 NOTE — ED Provider Notes (Signed)
CSN: [id number]     Arrival date & time 02/08/16  1623 History   First MD Initiated Contact with Patient 02/08/16 1637     Chief Complaint  Patient presents with  . Sore Throat  . Facial Pain     (Consider location/radiation/quality/duration/timing/severity/associated sxs/prior Treatment) Patient is a 35 y.o. male presenting with pharyngitis. The history is provided by the patient.  Sore Throat This is a new problem. The current episode started in the past 7 days. The problem occurs constantly. The problem has been gradually worsening. Associated symptoms include chills, congestion, coughing, myalgias and a sore throat. Pertinent negatives include no abdominal pain, fever, headaches, nausea, rash or vomiting.   Zachary Fischer is a 35 y.o. male who presents to the ED with sore throat, congestion and cough that started 4 days ago. Patient reports having allergies but states that this is worse. He reports sinus pressure and burning in nose and throat. Cough is productive with white sputum. He denies fever, n/v/d or abdominal pain. He has taken Zyrtec and Sudafed Sinus and Cold, Advil Sinus and other OTC allergy mediations without relief.   Past Medical History  Diagnosis Date  . Allergy    History reviewed. No pertinent past surgical history. Family History  Problem Relation Age of Onset  . Cancer Mother     stage 4 breast cancer  . Diabetes Mother   . Stroke Maternal Grandmother   . Heart disease Maternal Grandmother   . Kidney disease Maternal Grandmother   . Hypertension Maternal Grandmother   . Diabetes Maternal Grandmother   . Cancer Maternal Grandfather     colon  . Hypertension Paternal Grandmother   . Hypertension Paternal Grandfather   . Cancer Paternal Grandfather     colon cancer  . Healthy Brother   . Healthy Sister    Social History  Substance Use Topics  . Smoking status: Never Smoker   . Smokeless tobacco: None  . Alcohol Use: No    Review of Systems   Constitutional: Positive for chills. Negative for fever.  HENT: Positive for congestion, sinus pressure and sore throat.   Eyes: Positive for redness and itching.  Respiratory: Positive for cough and shortness of breath. Negative for wheezing.   Gastrointestinal: Negative for nausea, vomiting, abdominal pain and diarrhea.  Genitourinary: Negative for dysuria, frequency and decreased urine volume.  Musculoskeletal: Positive for myalgias.  Skin: Negative for rash.  Neurological: Negative for syncope, light-headedness and headaches.  Psychiatric/Behavioral: Negative for confusion. The patient is not nervous/anxious.       Allergies  Review of patient's allergies indicates no known allergies.  Home Medications   Prior to Admission medications   Medication Sig Start Date End Date Taking? Authorizing Provider  azithromycin (ZITHROMAX) 250 MG tablet Take 1 tablet (250 mg total) by mouth daily. Take first 2 tablets together, then 1 every day until finished. 02/08/16   Jan [place], NP  guaiFENesin-codeine (ROBITUSSIN AC) 100-10 MG/5ML syrup Take 5 mLs by mouth 3 (three) times daily as needed for cough. 02/08/16   Nilaya Bouie Bunnie Pion, NP  predniSONE (DELTASONE) 10 MG tablet Take 2 tablets (20 mg total) by mouth 2 (two) times daily with a meal. 02/08/16   Skai Lickteig Bunnie Pion, NP   BP 124/74 mmHg  Pulse 101  Temp(Src) 98.7 F (37.1 C) (Oral)  Resp 18  Ht 5\' 8"  (1.727 m)  Wt 83.915 kg  BMI 28.14 kg/m2  SpO2 99% Physical Exam  Constitutional: He is oriented to  person, place, and time. He appears well-developed and well-nourished.  HENT:  Head: Normocephalic and atraumatic.  Right Ear: Tympanic membrane normal.  Left Ear: Tympanic membrane normal.  Nose: Right sinus exhibits maxillary sinus tenderness. Left sinus exhibits maxillary sinus tenderness.  Mouth/Throat: Uvula is midline and mucous membranes are normal. Posterior oropharyngeal erythema present.  Eyes: EOM are normal. Pupils are equal, round,  and reactive to light.  Neck: Normal range of motion. Neck supple.  Cardiovascular: Regular rhythm.  Tachycardia present.   Pulmonary/Chest: Effort normal. No respiratory distress. He has decreased breath sounds. He has wheezes.  Abdominal: Soft. Bowel sounds are normal. There is no tenderness.  Musculoskeletal: Normal range of motion.  Lymphadenopathy:    He has no cervical adenopathy.  Neurological: He is alert and oriented to person, place, and time. No cranial nerve deficit.  Skin: Skin is warm and dry.  Psychiatric: He has a normal mood and affect. His behavior is normal.  Nursing note and vitals reviewed.   ED Course  Procedures (including critical care time) DuoNeb, Tussionex, CXR  5:50 pm Re examined after duoneb and patient's lungs are clear. Patient states he does feel better  Albuterol inhaler with instructions at d/c  Labs Review Labs Reviewed - No data to display  Imaging Review Dg Chest 2 View  02/08/2016  CLINICAL DATA:  Sore throat with chest congestion EXAM: CHEST  2 VIEW COMPARISON:  January 15, 2008 FINDINGS: Lungs are clear. Heart size and pulmonary vascularity are normal. No adenopathy. No bone lesions. IMPRESSION: No edema or consolidation. Electronically Signed   By: Lowella Grip III M.D.   On: 02/08/2016 17:07    MDM  35 y.o. male with cough, congestion, sore throat and sinus pressure stable for d/c with improvement after Duoneb. Stable for d/c without respiratory distress and O2 SAT 99% on R/A. Discussed with the patient clinical and x-ray findings and plan of care and all questioned fully answered. He will f/u with his PCP or return here if any problems arise.  Rx Robitussin AC, prednisone, Z-pak  Final diagnoses:  Bronchitis with bronchospasm  Sinus headache       Ashley Murrain, NP 02/08/16 [place], DO 02/09/16 2033

## 2016-02-08 NOTE — ED Notes (Signed)
Pt reports head and chest congestion and sore throat since Sunday.  Reports sinus congestion.

## 2018-01-13 DIAGNOSIS — M542 Cervicalgia: Secondary | ICD-10-CM | POA: Diagnosis not present

## 2018-01-13 DIAGNOSIS — M9901 Segmental and somatic dysfunction of cervical region: Secondary | ICD-10-CM | POA: Diagnosis not present

## 2018-01-13 DIAGNOSIS — M5382 Other specified dorsopathies, cervical region: Secondary | ICD-10-CM | POA: Diagnosis not present

## 2018-01-14 DIAGNOSIS — M542 Cervicalgia: Secondary | ICD-10-CM | POA: Diagnosis not present

## 2018-01-15 DIAGNOSIS — M542 Cervicalgia: Secondary | ICD-10-CM | POA: Diagnosis not present

## 2018-01-15 DIAGNOSIS — M5382 Other specified dorsopathies, cervical region: Secondary | ICD-10-CM | POA: Diagnosis not present

## 2018-01-15 DIAGNOSIS — M9901 Segmental and somatic dysfunction of cervical region: Secondary | ICD-10-CM | POA: Diagnosis not present

## 2018-01-20 ENCOUNTER — Emergency Department (HOSPITAL_COMMUNITY)
Admission: EM | Admit: 2018-01-20 | Discharge: 2018-01-21 | Disposition: A | Discharge status: Home / Self Care | Payer: 59 | Attending: Emergency Medicine | Admitting: Emergency Medicine

## 2018-01-20 ENCOUNTER — Encounter (HOSPITAL_COMMUNITY): Payer: Self-pay | Admitting: Emergency Medicine

## 2018-01-20 ENCOUNTER — Other Ambulatory Visit: Payer: Self-pay

## 2018-01-20 DIAGNOSIS — R112 Nausea with vomiting, unspecified: Secondary | ICD-10-CM

## 2018-01-20 DIAGNOSIS — K2970 Gastritis, unspecified, without bleeding: Secondary | ICD-10-CM | POA: Diagnosis not present

## 2018-01-20 DIAGNOSIS — R197 Diarrhea, unspecified: Secondary | ICD-10-CM | POA: Insufficient documentation

## 2018-01-20 DIAGNOSIS — D696 Thrombocytopenia, unspecified: Secondary | ICD-10-CM | POA: Insufficient documentation

## 2018-01-20 DIAGNOSIS — Z79899 Other long term (current) drug therapy: Secondary | ICD-10-CM | POA: Insufficient documentation

## 2018-01-20 MED ORDER — ONDANSETRON 8 MG PO TBDP: 8.0000 mg | ORAL_TABLET | Freq: Once | ORAL | Status: DC

## 2018-01-20 NOTE — ED Notes (Signed)
Pt lying in the floor voluntarily when this nurse went to waiting room. Notified pt he could not lie in the floor. Pt was able to stand up and get into wheelchair. NAD

## 2018-01-20 NOTE — ED Provider Notes (Signed)
[facility] EMERGENCY DEPARTMENT Provider Note   CSN: [id number] Arrival date & time: 01/20/18  2052     History   Chief Complaint Chief Complaint  Patient presents with  . Emesis    HPI Zachary Fischer is a 37 y.o. male.  The history is provided by the patient.  He is usually in good health, but comes in with onset this evening of nausea, vomiting, diarrhea.  There were associated chills and sweats but no fever.  He has had some crampy abdominal pain and headache.  He rates his abdominal pain at 8/10, and headache at 5/10.  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  He has not done anything to treat his symptoms.  Past Medical History:  Diagnosis Date  . Allergy     Patient Active Problem List   Diagnosis Date Noted  . Routine general medical examination at a health care facility 05/29/2013    History reviewed. No pertinent surgical history.      Home Medications    Prior to Admission medications   Medication Sig Start Date End Date Taking? Authorizing Provider  azithromycin (ZITHROMAX) 250 MG tablet Take 1 tablet (250 mg total) by mouth daily. Take first 2 tablets together, then 1 every day until finished. 02/08/16   Ashley Murrain, NP  guaiFENesin-codeine [facility]) 100-10 MG/5ML syrup Take 5 mLs by mouth 3 (three) times daily as needed for cough. 02/08/16   Ashley Murrain, NP  predniSONE (DELTASONE) 10 MG tablet Take 2 tablets (20 mg total) by mouth 2 (two) times daily with a meal. 02/08/16   Ashley Murrain, NP    Family History Family History  Problem Relation Age of Onset  . Cancer Mother        stage 4 breast cancer  . Diabetes Mother   . Stroke Maternal Grandmother   . Heart disease Maternal Grandmother   . Kidney disease Maternal Grandmother   . Hypertension Maternal Grandmother   . Diabetes Maternal Grandmother   . Cancer Maternal Grandfather        colon  . Hypertension Paternal Grandmother   .  Hypertension Paternal Grandfather   . Cancer Paternal Grandfather        colon cancer  . Healthy Brother   . Healthy Sister     Social History Social History   Tobacco Use  . Smoking status: Never Smoker  . Smokeless tobacco: Never Used  Substance Use Topics  . Alcohol use: No  . Drug use: No     Allergies   Patient has no known allergies.   Review of Systems Review of Systems  All other systems reviewed and are negative.    Physical Exam Updated Vital Signs BP 134/75   Pulse (!) 101   Temp 98.8 F (37.1 C)   Resp 18   Ht 5\' 7"  (1.702 m)   Wt 81.6 kg (180 lb)   SpO2 100%   BMI 28.19 kg/m   Physical Exam  Nursing note and vitals reviewed.  37 year old male, resting comfortably and in no acute distress. Vital signs are significant for borderline elevated heart rate. Oxygen saturation is 100%, which is normal. Head is normocephalic and atraumatic. PERRLA, EOMI. Oropharynx is clear. Neck is nontender and supple without adenopathy or JVD. Back is nontender and there is no CVA tenderness. Lungs are clear without rales, wheezes, or rhonchi. Chest is nontender. Heart has regular rate and  rhythm without murmur. Abdomen is soft, flat, with mild tenderness diffusely.  There is no rebound or guarding.  There are no masses or hepatosplenomegaly and peristalsis is hypoactive. Extremities have no cyanosis or edema, full range of motion is present. Skin is warm and dry without rash. Neurologic: Mental status is normal, cranial nerves are intact, there are no motor or sensory deficits.  ED Treatments / Results  Labs (all labs ordered are listed, but only abnormal results are displayed) Labs Reviewed  CBC WITH DIFFERENTIAL/PLATELET - Abnormal; Notable for the following components:      Result Value   Platelets 138 (*)    Lymphs Abs 0.3 (*)    All other components within normal limits  BASIC METABOLIC PANEL    Procedures Procedures  Medications Ordered in  ED Medications  ondansetron (ZOFRAN-ODT) disintegrating tablet 8 mg (has no administration in time range)     Initial Impression / Assessment and Plan / ED Course  I have reviewed the triage vital signs and the nursing notes.  Pertinent labs & imaging results that were available during my care of the patient were reviewed by me and considered in my medical decision making (see chart for details).  Nausea, vomiting, diarrhea and pattern most suggestive of gastroenteritis.  This is most likely viral, although food poisoning is a possibility.  No red flags to suggest more serious illness.  He will be given IV fluids and IV ondansetron.  Screening labs obtained.  Old records are reviewed, and he has no relevant past visits.   Laboratory work-up shows mild thrombocytopenia which is not felt to be clinically significant.  He feels much better after above-noted treatment.  He is discharged with a take-home pack of ondansetron.  Advised to use loperamide if he has recurrence of diarrhea.  Follow-up with PCP as needed.  Given work release for 24 hours.  Final Clinical Impressions(s) / ED Diagnoses   Final diagnoses:  Nausea vomiting and diarrhea  Thrombocytopenia [facility])    ED Discharge Orders        Ordered    ondansetron (ZOFRAN) 4 MG tablet  Every 8 hours PRN     58/30/94 0768       Delora Fuel, MD 08/81/10 [phone]

## 2018-01-20 NOTE — ED Triage Notes (Signed)
Pt c/o vomiting, abd pain, and headache x 2 hours.

## 2018-01-21 LAB — CBC WITH DIFFERENTIAL/PLATELET
BASOS PCT: 0 %
Basophils Absolute: 0 10*3/uL (ref 0.0–0.1)
Eosinophils Absolute: 0 10*3/uL (ref 0.0–0.7)
Eosinophils Relative: 0 %
HCT: 42.6 % (ref 39.0–52.0)
Hemoglobin: 14.6 g/dL (ref 13.0–17.0)
LYMPHS PCT: 5 %
Lymphs Abs: 0.3 10*3/uL — ABNORMAL LOW (ref 0.7–4.0)
MCH: 30.5 pg (ref 26.0–34.0)
MCHC: 34.3 g/dL (ref 30.0–36.0)
MCV: 88.9 fL (ref 78.0–100.0)
Monocytes Absolute: 0.4 10*3/uL (ref 0.1–1.0)
Monocytes Relative: 6 %
Neutro Abs: 5.7 10*3/uL (ref 1.7–7.7)
Neutrophils Relative %: 89 %
PLATELETS: 138 10*3/uL — AB (ref 150–400)
RBC: 4.79 MIL/uL (ref 4.22–5.81)
RDW: 13 % (ref 11.5–15.5)
WBC: 6.3 10*3/uL (ref 4.0–10.5)

## 2018-01-21 LAB — BASIC METABOLIC PANEL
Anion gap: 14 (ref 5–15)
BUN: 16 mg/dL (ref 6–20)
CALCIUM: 9.3 mg/dL (ref 8.9–10.3)
CO2: 22 mmol/L (ref 22–32)
Chloride: 102 mmol/L (ref 101–111)
Creatinine, Ser: 0.8 mg/dL (ref 0.61–1.24)
GFR calc Af Amer: >60 mL/min (ref >60)
Glucose, Bld: 94 mg/dL (ref 65–99)
POTASSIUM: 3.6 mmol/L (ref 3.5–5.1)
Sodium: 138 mmol/L (ref 135–145)

## 2018-01-21 MED ORDER — ONDANSETRON HCL 4 MG PO TABS: 4.0000 mg | ORAL_TABLET | Freq: Three times a day (TID) | ORAL | 0 refills | Status: DC | PRN

## 2018-01-21 MED ORDER — SODIUM CHLORIDE 0.9 % IV BOLUS
1000.0000 mL | Freq: Once | INTRAVENOUS | Status: AC
  Administered 2018-01-21: 1000 mL via INTRAVENOUS

## 2018-01-21 MED ORDER — ONDANSETRON HCL 4 MG/2ML IJ SOLN
4.0000 mg | Freq: Once | INTRAMUSCULAR | Status: AC
  Administered 2018-01-21: 4 mg via INTRAVENOUS
  Filled 2018-01-21: qty 2

## 2018-01-21 NOTE — Discharge Instructions (Addendum)
Take loperamide (Imodium A-D) as needed for diarrhea. ?

## 2018-01-27 DIAGNOSIS — M5382 Other specified dorsopathies, cervical region: Secondary | ICD-10-CM | POA: Diagnosis not present

## 2018-01-27 DIAGNOSIS — M9901 Segmental and somatic dysfunction of cervical region: Secondary | ICD-10-CM | POA: Diagnosis not present

## 2018-01-27 DIAGNOSIS — M542 Cervicalgia: Secondary | ICD-10-CM | POA: Diagnosis not present

## 2018-01-30 DIAGNOSIS — M9901 Segmental and somatic dysfunction of cervical region: Secondary | ICD-10-CM | POA: Diagnosis not present

## 2018-01-30 DIAGNOSIS — M5382 Other specified dorsopathies, cervical region: Secondary | ICD-10-CM | POA: Diagnosis not present

## 2018-01-30 DIAGNOSIS — M542 Cervicalgia: Secondary | ICD-10-CM | POA: Diagnosis not present

## 2018-02-04 ENCOUNTER — Encounter: Payer: Self-pay | Admitting: *Deleted

## 2018-02-28 ENCOUNTER — Encounter: Payer: Self-pay | Admitting: Family Medicine

## 2018-02-28 ENCOUNTER — Ambulatory Visit (INDEPENDENT_AMBULATORY_CARE_PROVIDER_SITE_OTHER): Payer: 59 | Admitting: Family Medicine

## 2018-02-28 VITALS — BP 118/76 | HR 68 | Temp 97.9°F | Resp 14 | Ht 67.0 in | Wt 180.0 lb

## 2018-02-28 DIAGNOSIS — L732 Hidradenitis suppurativa: Secondary | ICD-10-CM

## 2018-02-28 DIAGNOSIS — L720 Epidermal cyst: Secondary | ICD-10-CM | POA: Diagnosis not present

## 2018-02-28 DIAGNOSIS — Z0000 Encounter for general adult medical examination without abnormal findings: Secondary | ICD-10-CM | POA: Diagnosis not present

## 2018-02-28 MED ORDER — DOXYCYCLINE HYCLATE 100 MG PO TABS
100.0000 mg | ORAL_TABLET | Freq: Two times a day (BID) | ORAL | 0 refills | Status: DC
Start: 2018-02-28 — End: 2018-03-11

## 2018-02-28 NOTE — Progress Notes (Signed)
Subjective:    Patient ID: Zachary Fischer, male    DOB: 1981-09-19, 37 y.o.   MRN: [id number]  HPI Patient is a very pleasant 37 year old African-American gentleman here today for complete physical exam.  He has 2 specific medical concerns.  #1 he has a large fluctuant subcutaneous mass on his posterior neck roughly the level of C4.  It is approximately 2 cm in diameter, freely mobile and fluctuant to touch.  It appears to be a sebaceous cyst.  He states is been there for years but is slowly enlarging.  He also has painful nodules in his left axilla.  There are numerous I would say 3-4 subcentimeter tender swollen subcutaneous nodules in the left axilla.  They do not appear to be lymph nodes.  They feel more superficial than that.  They are extremely tender to touch but they are not fluctuant.  It appears to be hidradenitis separative.  He states his been like that for a couple of weeks but is gradually getting worse.  He has no previous lesions in his axilla or in his pubic hair.  Family history significant for prostate cancer in his grandfather, breast cancer in his mother, diabetes and his mother and in his father. Past Medical History:  Diagnosis Date  . Allergy    No past surgical history on file. No current outpatient medications on file prior to visit.   No current facility-administered medications on file prior to visit.    No Known Allergies Social History   Socioeconomic History  . Marital status: Married    Spouse name: Not on file  . Number of children: Not on file  . Years of education: Not on file  . Highest education level: Not on file  Occupational History  . Not on file  Social Needs  . Financial resource strain: Not on file  . Food insecurity:    Worry: Not on file    Inability: Not on file  . Transportation needs:    Medical: Not on file    Non-medical: Not on file  Tobacco Use  . Smoking status: Never Smoker  . Smokeless tobacco: Never Used  Substance and  Sexual Activity  . Alcohol use: No  . Drug use: No  . Sexual activity: Not on file  Lifestyle  . Physical activity:    Days per week: Not on file    Minutes per session: Not on file  . Stress: Not on file  Relationships  . Social connections:    Talks on phone: Not on file    Gets together: Not on file    Attends religious service: Not on file    Active member of club or organization: Not on file    Attends meetings of clubs or organizations: Not on file    Relationship status: Not on file  . Intimate partner violence:    Fear of current or ex partner: Not on file    Emotionally abused: Not on file    Physically abused: Not on file    Forced sexual activity: Not on file  Other Topics Concern  . Not on file  Social History Narrative  . 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 Cancer Mother        stage 4 breast cancer  . Diabetes Mother   . Stroke Maternal Grandmother   .  Heart disease Maternal Grandmother   . Kidney disease Maternal Grandmother   . Hypertension Maternal Grandmother   . Diabetes Maternal Grandmother   . Cancer Maternal Grandfather        colon  . Hypertension Paternal Grandmother   . Hypertension Paternal Grandfather   . Cancer Paternal Grandfather        colon cancer  . Healthy Brother   . Healthy Sister       Review of Systems  All other systems reviewed and are negative.      Objective:   Physical Exam  Constitutional: He is oriented to person, place, and time. He appears well-developed and well-nourished. No distress.  HENT:  Head: Normocephalic and atraumatic.  Right Ear: External ear normal.  Left Ear: External ear normal.  Nose: Nose normal.  Mouth/Throat: Oropharynx is clear and moist. No oropharyngeal exudate.  Eyes: Pupils are equal, round, and reactive to light. Conjunctivae and EOM are normal. Right eye exhibits no  discharge. Left eye exhibits no discharge. No scleral icterus.  Neck: Normal range of motion. Neck supple. No JVD present. No tracheal deviation present. No thyromegaly present.  Cardiovascular: Normal rate, regular rhythm, normal heart sounds and intact distal pulses. Exam reveals no gallop and no friction rub.  No murmur heard. Pulmonary/Chest: Effort normal and breath sounds normal. No stridor. No respiratory distress. He has no wheezes. He has no rales. He exhibits no tenderness.  Abdominal: Soft. Bowel sounds are normal. He exhibits no distension and no mass. There is no tenderness. There is no rebound and no guarding. No hernia. Hernia confirmed negative in the right inguinal area and confirmed negative in the left inguinal area.  Genitourinary: Testes normal and penis normal. Right testis shows no mass and no tenderness. Left testis shows no mass and no tenderness.  Musculoskeletal: Normal range of motion. He exhibits no edema, tenderness or deformity.  Lymphadenopathy:    He has no cervical adenopathy. No inguinal adenopathy noted on the right or left side.  Neurological: He is alert and oriented to person, place, and time. He displays normal reflexes. No cranial nerve deficit. He exhibits normal muscle tone. Coordination normal.  Skin: Skin is warm. Capillary refill takes less than 2 seconds. No rash noted. He is not diaphoretic. No erythema. No pallor.   2 cm sebaceous cyst on the posterior neck, 4 subcutaneous tender nodules in the left axilla       Assessment & Plan:  Routine general medical examination at a health care facility - Plan: CBC with Differential/Platelet, COMPLETE METABOLIC PANEL WITH GFR, Lipid panel  Epidermal cyst - Plan: Ambulatory referral to General Surgery  Hidradenitis suppurativa  Physical exam is normal.  Check regular lab work including a CBC, CMP, and fasting lipid panel.  Given the size, I would consult general surgery for excision of the sebaceous cyst  on the posterior neck.  I will treat the painful nodules in the left axilla as hidradenitis suppurtiva with doxycycline 100 mg p.o. twice daily for 10 days, warm compresses 3-4 times a day, and ibuprofen 800 mg every 8 hours.

## 2018-03-01 LAB — CBC WITH DIFFERENTIAL/PLATELET
Basophils Absolute: 30 cells/uL (ref 0–200)
Basophils Relative: 0.5 %
EOS ABS: 102 {cells}/uL (ref 15–500)
Eosinophils Relative: 1.7 %
HCT: 41.5 % (ref 38.5–50.0)
Hemoglobin: 14.5 g/dL (ref 13.2–17.1)
Lymphs Abs: 1266 cells/uL (ref 850–3900)
MCH: 29.7 pg (ref 27.0–33.0)
MCHC: 34.9 g/dL (ref 32.0–36.0)
MCV: 85 fL (ref 80.0–100.0)
MONOS PCT: 9.9 %
MPV: 11.8 fL (ref 7.5–12.5)
Neutro Abs: 4008 cells/uL (ref 1500–7800)
Neutrophils Relative %: 66.8 %
Platelets: 221 10*3/uL (ref 140–400)
RBC: 4.88 10*6/uL (ref 4.20–5.80)
RDW: 12.6 % (ref 11.0–15.0)
Total Lymphocyte: 21.1 %
WBC mixed population: 594 cells/uL (ref 200–950)
WBC: 6 10*3/uL (ref 3.8–10.8)

## 2018-03-01 LAB — COMPLETE METABOLIC PANEL WITH GFR
AG Ratio: 1.7 (calc) (ref 1.0–2.5)
ALKALINE PHOSPHATASE (APISO): 99 U/L (ref 40–115)
ALT: 24 U/L (ref 9–46)
AST: 20 U/L (ref 10–40)
Albumin: 4.5 g/dL (ref 3.6–5.1)
BILIRUBIN TOTAL: 0.5 mg/dL (ref 0.2–1.2)
BUN: 12 mg/dL (ref 7–25)
CHLORIDE: 102 mmol/L (ref 98–110)
CO2: 29 mmol/L (ref 20–32)
Calcium: 9.5 mg/dL (ref 8.6–10.3)
Creat: 0.89 mg/dL (ref 0.60–1.35)
GFR, EST AFRICAN AMERICAN: 128 mL/min/{1.73_m2} (ref >=60)
GFR, Est Non African American: 111 mL/min/{1.73_m2} (ref >=60)
Globulin: 2.7 g/dL (calc) (ref 1.9–3.7)
Glucose, Bld: 79 mg/dL (ref 65–99)
Potassium: 4.5 mmol/L (ref 3.5–5.3)
Sodium: 139 mmol/L (ref 135–146)
TOTAL PROTEIN: 7.2 g/dL (ref 6.1–8.1)

## 2018-03-01 LAB — LIPID PANEL
Cholesterol: 131 mg/dL (ref <200)
HDL: 43 mg/dL (ref >40)
LDL Cholesterol (Calc): 74 mg/dL (calc)
Non-HDL Cholesterol (Calc): 88 mg/dL (calc) (ref <130)
Total CHOL/HDL Ratio: 3 (calc) (ref <5.0)
Triglycerides: 56 mg/dL (ref <150)

## 2018-03-03 ENCOUNTER — Encounter: Payer: Self-pay | Admitting: Family Medicine

## 2018-03-09 DIAGNOSIS — L02414 Cutaneous abscess of left upper limb: Secondary | ICD-10-CM | POA: Diagnosis not present

## 2018-03-09 DIAGNOSIS — L02412 Cutaneous abscess of left axilla: Secondary | ICD-10-CM | POA: Diagnosis not present

## 2018-03-09 DIAGNOSIS — L539 Erythematous condition, unspecified: Secondary | ICD-10-CM | POA: Diagnosis not present

## 2018-03-09 DIAGNOSIS — L03112 Cellulitis of left axilla: Secondary | ICD-10-CM | POA: Diagnosis not present

## 2018-03-09 DIAGNOSIS — M79602 Pain in left arm: Secondary | ICD-10-CM | POA: Diagnosis not present

## 2018-03-09 DIAGNOSIS — L03114 Cellulitis of left upper limb: Secondary | ICD-10-CM | POA: Diagnosis not present

## 2018-03-11 ENCOUNTER — Ambulatory Visit (INDEPENDENT_AMBULATORY_CARE_PROVIDER_SITE_OTHER): Payer: 59 | Admitting: Family Medicine

## 2018-03-11 ENCOUNTER — Encounter: Payer: Self-pay | Admitting: Family Medicine

## 2018-03-11 VITALS — BP 122/76 | HR 78 | Temp 98.1°F | Resp 12 | Ht 67.0 in | Wt 180.0 lb

## 2018-03-11 DIAGNOSIS — L089 Local infection of the skin and subcutaneous tissue, unspecified: Secondary | ICD-10-CM | POA: Diagnosis not present

## 2018-03-11 DIAGNOSIS — L723 Sebaceous cyst: Secondary | ICD-10-CM

## 2018-03-11 MED ORDER — TRAMADOL HCL 50 MG PO TABS: 50.0000 mg | ORAL_TABLET | Freq: Three times a day (TID) | ORAL | 0 refills | Status: DC | PRN

## 2018-03-12 NOTE — Progress Notes (Signed)
Subjective:    Patient ID: Zachary Fischer, male    DOB: Apr 20, 1981, 37 y.o.   MRN: [id number]  HPI Unfortunately, after the patient's last visit, the inflamed infected cyst in his left axilla worsened.  The pain worsened.  This prompted him to go to the emergency room.  The cyst was anesthetized and drained.  It was packed with approximately 4 inches of 1/4 inch iodoform gauze.  He was switched to Keflex and Bactrim.  This occurred approximately 48 hours ago.  He is here today to remove the packing in for recheck.  The I&D site is extremely sore.  However there is no spreading erythema.  The pain has improved aside from the open wound.  He denies any fevers or chills or signs of systemic illness.  All of the packing was removed in its entirety.  I pressed around the I&D site.  No further drainage was expressed.  There was no residual bleeding.  There was no sign of spreading cellulitis.  Patient has approximately 10 days of antibiotics left. Past Medical History:  Diagnosis Date  . Allergy    No past surgical history on file. Current Outpatient Medications on File Prior to Visit  Medication Sig Dispense Refill  . cephALEXin (KEFLEX) 500 MG capsule Take by mouth.    . sulfamethoxazole-trimethoprim (BACTRIM DS,SEPTRA DS) 800-160 MG tablet Take by mouth.     No current facility-administered medications on file prior to visit.    No Known Allergies Social History   Socioeconomic History  . Marital status: Married    Spouse name: Not on file  . Number of children: Not on file  . Years of education: Not on file  . Highest education level: Not on file  Occupational History  . Not on file  Social Needs  . Financial resource strain: Not on file  . Food insecurity:    Worry: Not on file    Inability: Not on file  . Transportation needs:    Medical: Not on file    Non-medical: Not on file  Tobacco Use  . Smoking status: Never Smoker  . Smokeless tobacco: Never Used  Substance and  Sexual Activity  . Alcohol use: No  . Drug use: No  . Sexual activity: Not on file  Lifestyle  . Physical activity:    Days per week: Not on file    Minutes per session: Not on file  . Stress: Not on file  Relationships  . Social connections:    Talks on phone: Not on file    Gets together: Not on file    Attends religious service: Not on file    Active member of club or organization: Not on file    Attends meetings of clubs or organizations: Not on file    Relationship status: Not on file  . Intimate partner violence:    Fear of current or ex partner: Not on file    Emotionally abused: Not on file    Physically abused: Not on file    Forced sexual activity: Not on file  Other Topics Concern  . Not on file  Social History Narrative  . Not on file      Review of Systems  All other systems reviewed and are negative.      Objective:   Physical Exam  Cardiovascular: Normal rate and regular rhythm.  Pulmonary/Chest: Effort normal and breath sounds normal.  Skin: Skin is warm. Laceration noted. No bruising and no ecchymosis noted.  No erythema.     Vitals reviewed.         Assessment & Plan:  Infected sebaceous cyst  Packing was removed.  Wound care was discussed.  I recommended daily dressing changes.  He is to apply copious amount of Neosporin to nonadherent gauze which I provided him today in the office then cover this with regular gauze and wrap with Covan.  I recommended keeping the area clean and dry and free of water.  I anticipate gradual healing by secondary intention over the next 7 to 14 days.  Recheck if worsening, if spreading erythema develops, or if wound is not healing as we discussed.  Complete antibiotics as prescribed.  I did give the patient a prescription for tramadol 50 mg every 6 hours as needed for pain

## 2018-03-20 DIAGNOSIS — L720 Epidermal cyst: Secondary | ICD-10-CM | POA: Diagnosis not present

## 2018-04-25 DIAGNOSIS — L720 Epidermal cyst: Secondary | ICD-10-CM | POA: Diagnosis not present

## 2018-04-25 DIAGNOSIS — L723 Sebaceous cyst: Secondary | ICD-10-CM | POA: Diagnosis not present

## 2018-06-09 ENCOUNTER — Other Ambulatory Visit: Payer: Self-pay

## 2018-06-09 ENCOUNTER — Encounter (HOSPITAL_COMMUNITY): Payer: Self-pay | Admitting: *Deleted

## 2018-06-09 ENCOUNTER — Ambulatory Visit (HOSPITAL_COMMUNITY)
Admission: EM | Admit: 2018-06-09 | Discharge: 2018-06-09 | Disposition: A | Discharge status: Home / Self Care | Payer: 59 | Source: Home / Self Care

## 2018-06-09 ENCOUNTER — Emergency Department (HOSPITAL_COMMUNITY)
Admission: EM | Admit: 2018-06-09 | Discharge: 2018-06-09 | Disposition: A | Discharge status: Home / Self Care | Payer: 59 | Attending: Emergency Medicine | Admitting: Emergency Medicine

## 2018-06-09 ENCOUNTER — Emergency Department (HOSPITAL_COMMUNITY): Payer: 59

## 2018-06-09 ENCOUNTER — Encounter (HOSPITAL_COMMUNITY): Payer: Self-pay

## 2018-06-09 DIAGNOSIS — R0602 Shortness of breath: Secondary | ICD-10-CM | POA: Diagnosis not present

## 2018-06-09 DIAGNOSIS — R079 Chest pain, unspecified: Secondary | ICD-10-CM | POA: Insufficient documentation

## 2018-06-09 LAB — BASIC METABOLIC PANEL
Anion gap: 9 (ref 5–15)
BUN: 6 mg/dL (ref 6–20)
CO2: 26 mmol/L (ref 22–32)
Calcium: 9.7 mg/dL (ref 8.9–10.3)
Chloride: 104 mmol/L (ref 98–111)
Creatinine, Ser: 0.77 mg/dL (ref 0.61–1.24)
GFR calc Af Amer: >60 mL/min (ref >60)
GFR calc non Af Amer: >60 mL/min (ref >60)
Glucose, Bld: 94 mg/dL (ref 70–99)
Potassium: 4 mmol/L (ref 3.5–5.1)
Sodium: 139 mmol/L (ref 135–145)

## 2018-06-09 LAB — CBC
HCT: 43.2 % (ref 39.0–52.0)
Hemoglobin: 14.3 g/dL (ref 13.0–17.0)
MCH: 29.7 pg (ref 26.0–34.0)
MCHC: 33.1 g/dL (ref 30.0–36.0)
MCV: 89.6 fL (ref 78.0–100.0)
Platelets: 195 10*3/uL (ref 150–400)
RBC: 4.82 MIL/uL (ref 4.22–5.81)
RDW: 13.3 % (ref 11.5–15.5)
WBC: 6.4 10*3/uL (ref 4.0–10.5)

## 2018-06-09 LAB — I-STAT TROPONIN, ED
Troponin i, poc: 0 ng/mL (ref 0.00–0.08)
Troponin i, poc: 0 ng/mL (ref 0.00–0.08)

## 2018-06-09 MED ORDER — ASPIRIN 81 MG PO CHEW
324.0000 mg | CHEWABLE_TABLET | Freq: Once | ORAL | Status: AC
  Administered 2018-06-09: 324 mg via ORAL
  Filled 2018-06-09: qty 4

## 2018-06-09 NOTE — ED Triage Notes (Signed)
Pt presents with central to left side chest pain and shortness of breath.

## 2018-06-09 NOTE — ED Triage Notes (Signed)
Pt in c/o chest pain that started today while at work, went to urgent care and sent over due to EKG abnormalities, reports some shortness of breath, episode of pain a few weeks ago that resolved after ASA, no distress noted

## 2018-06-09 NOTE — ED Notes (Signed)
E-signature not available, pt verbalized understanding of DC instructions. Pt declined wheelchair.

## 2018-06-09 NOTE — ED Provider Notes (Addendum)
MSE was initiated and I personally evaluated the patient and placed orders (if any) at  2:42 PM on June 09, 2018.  The patient appears stable so that the remainder of the MSE may be completed by another provider.  Patient placed in Quick Look pathway, seen and evaluated   Chief Complaint: Chest pain  HPI:   Patient is a 37 year old male with no significant past medical history presenting for left-sided chest pain.  Sent from UC with EKG changes. 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  Patient reports it is nonradiating and approximately 5 out of 10 severity now.  Patient denies any shortness of breath.  Family history of early MI in his grandmother in her 22s.  Patient is a never smoker.  No recent fever or chills, the patient denies URI history w/in last month but does report cough recently.  ROS: See HPI (one)  Physical Exam:   Gen: No distress  Neuro: Awake and Alert  Skin: Warm. No diaphoresis.    Focused Exam: Heart regular rate and rhythm.  Lungs clear to auscultation.  Radial pulses equal and 2+ bilaterally.   Initiation of care has begun. The patient has been counseled on the process, plan, and necessity for staying for the completion/evaluation, and the remainder of the medical screening examination    Tamala Julian 06/09/18 1447    Davonna Belling, MD 06/09/18 1555

## 2019-05-12 ENCOUNTER — Encounter: Payer: Self-pay | Admitting: Emergency Medicine

## 2019-05-12 ENCOUNTER — Other Ambulatory Visit: Payer: Self-pay

## 2019-05-12 ENCOUNTER — Observation Stay
Admission: EM | Admit: 2019-05-12 | Discharge: 2019-05-13 | Disposition: A | Discharge status: Home / Self Care | Payer: BC Managed Care – PPO | Attending: Internal Medicine | Admitting: Internal Medicine

## 2019-05-12 DIAGNOSIS — K222 Esophageal obstruction: Principal | ICD-10-CM

## 2019-05-12 DIAGNOSIS — Z20828 Contact with and (suspected) exposure to other viral communicable diseases: Secondary | ICD-10-CM | POA: Diagnosis not present

## 2019-05-12 DIAGNOSIS — R198 Other specified symptoms and signs involving the digestive system and abdomen: Secondary | ICD-10-CM

## 2019-05-12 DIAGNOSIS — F458 Other somatoform disorders: Secondary | ICD-10-CM | POA: Diagnosis not present

## 2019-05-12 DIAGNOSIS — Z0184 Encounter for antibody response examination: Secondary | ICD-10-CM | POA: Insufficient documentation

## 2019-05-12 DIAGNOSIS — K2970 Gastritis, unspecified, without bleeding: Secondary | ICD-10-CM | POA: Insufficient documentation

## 2019-05-12 DIAGNOSIS — K219 Gastro-esophageal reflux disease without esophagitis: Secondary | ICD-10-CM | POA: Insufficient documentation

## 2019-05-12 DIAGNOSIS — K2210 Ulcer of esophagus without bleeding: Secondary | ICD-10-CM | POA: Diagnosis not present

## 2019-05-12 DIAGNOSIS — R0989 Other specified symptoms and signs involving the circulatory and respiratory systems: Secondary | ICD-10-CM

## 2019-05-12 DIAGNOSIS — F419 Anxiety disorder, unspecified: Secondary | ICD-10-CM | POA: Insufficient documentation

## 2019-05-12 DIAGNOSIS — R09A2 Foreign body sensation, throat: Secondary | ICD-10-CM

## 2019-05-12 MED ORDER — GLUCAGON HCL RDNA (DIAGNOSTIC) 1 MG IJ SOLR
1.0000 mg | Freq: Once | INTRAMUSCULAR | Status: AC
  Administered 2019-05-12: 1 mg via INTRAVENOUS
  Filled 2019-05-12: qty 1

## 2019-05-12 NOTE — ED Triage Notes (Signed)
Pt arrived via POV with reports of post nasal drip, states over the past 4-5 days, pt states he has a constant tickle in the back of his throat, states when he coughs his gag reflex is initiated and makes him vomit. Pt states cough is dry.  Pt states he feels like a ball of mucus stuck in his throat.   Pt is able to drink fluids and eat.

## 2019-05-13 ENCOUNTER — Observation Stay: Payer: BC Managed Care – PPO | Admitting: Anesthesiology

## 2019-05-13 ENCOUNTER — Emergency Department: Payer: BC Managed Care – PPO

## 2019-05-13 ENCOUNTER — Encounter
Admission: EM | Disposition: A | Discharge status: Home / Self Care | Payer: Self-pay | Source: Home / Self Care | Attending: Emergency Medicine

## 2019-05-13 DIAGNOSIS — K222 Esophageal obstruction: Secondary | ICD-10-CM

## 2019-05-13 DIAGNOSIS — R1310 Dysphagia, unspecified: Secondary | ICD-10-CM

## 2019-05-13 HISTORY — PX: ESOPHAGOGASTRODUODENOSCOPY (EGD) WITH PROPOFOL: SHX5813

## 2019-05-13 LAB — CBC
HCT: 42.2 % (ref 39.0–52.0)
Hemoglobin: 14.4 g/dL (ref 13.0–17.0)
MCH: 29.6 pg (ref 26.0–34.0)
MCHC: 34.1 g/dL (ref 30.0–36.0)
MCV: 86.7 fL (ref 80.0–100.0)
Platelets: 176 10*3/uL (ref 150–400)
RBC: 4.87 MIL/uL (ref 4.22–5.81)
RDW: 13 % (ref 11.5–15.5)
WBC: 6.4 10*3/uL (ref 4.0–10.5)
nRBC: 0 % (ref 0.0–0.2)

## 2019-05-13 LAB — BASIC METABOLIC PANEL
Anion gap: 10 (ref 5–15)
BUN: 9 mg/dL (ref 6–20)
CO2: 27 mmol/L (ref 22–32)
Calcium: 9 mg/dL (ref 8.9–10.3)
Chloride: 100 mmol/L (ref 98–111)
Creatinine, Ser: 0.87 mg/dL (ref 0.61–1.24)
GFR calc Af Amer: >60 mL/min (ref >60)
GFR calc non Af Amer: >60 mL/min (ref >60)
Glucose, Bld: 88 mg/dL (ref 70–99)
Potassium: 3.5 mmol/L (ref 3.5–5.1)
Sodium: 137 mmol/L (ref 135–145)

## 2019-05-13 LAB — COMPREHENSIVE METABOLIC PANEL
ALT: 26 U/L (ref 0–44)
AST: 29 U/L (ref 15–41)
Albumin: 4.6 g/dL (ref 3.5–5.0)
Alkaline Phosphatase: 88 U/L (ref 38–126)
Anion gap: 10 (ref 5–15)
BUN: 9 mg/dL (ref 6–20)
CO2: 27 mmol/L (ref 22–32)
Calcium: 9.7 mg/dL (ref 8.9–10.3)
Chloride: 100 mmol/L (ref 98–111)
Creatinine, Ser: 0.82 mg/dL (ref 0.61–1.24)
GFR calc Af Amer: >60 mL/min (ref >60)
GFR calc non Af Amer: >60 mL/min (ref >60)
Glucose, Bld: 82 mg/dL (ref 70–99)
Potassium: 3.7 mmol/L (ref 3.5–5.1)
Sodium: 137 mmol/L (ref 135–145)
Total Bilirubin: 0.8 mg/dL (ref 0.3–1.2)
Total Protein: 7.7 g/dL (ref 6.5–8.1)

## 2019-05-13 LAB — CBC WITH DIFFERENTIAL/PLATELET
Abs Immature Granulocytes: 0.02 10*3/uL (ref 0.00–0.07)
Basophils Absolute: 0 10*3/uL (ref 0.0–0.1)
Basophils Relative: 1 %
Eosinophils Absolute: 0.1 10*3/uL (ref 0.0–0.5)
Eosinophils Relative: 2 %
HCT: 41.6 % (ref 39.0–52.0)
Hemoglobin: 14.3 g/dL (ref 13.0–17.0)
Immature Granulocytes: 0 %
Lymphocytes Relative: 31 %
Lymphs Abs: 2.1 10*3/uL (ref 0.7–4.0)
MCH: 29.9 pg (ref 26.0–34.0)
MCHC: 34.4 g/dL (ref 30.0–36.0)
MCV: 86.8 fL (ref 80.0–100.0)
Monocytes Absolute: 0.8 10*3/uL (ref 0.1–1.0)
Monocytes Relative: 11 %
Neutro Abs: 3.8 10*3/uL (ref 1.7–7.7)
Neutrophils Relative %: 55 %
Platelets: 179 10*3/uL (ref 150–400)
RBC: 4.79 MIL/uL (ref 4.22–5.81)
RDW: 13 % (ref 11.5–15.5)
WBC: 6.9 10*3/uL (ref 4.0–10.5)
nRBC: 0 % (ref 0.0–0.2)

## 2019-05-13 LAB — PROTIME-INR
INR: 1.1 (ref 0.8–1.2)
Prothrombin Time: 13.7 seconds (ref 11.4–15.2)

## 2019-05-13 LAB — SARS CORONAVIRUS 2 BY RT PCR (HOSPITAL ORDER, PERFORMED IN ~~LOC~~ HOSPITAL LAB): SARS Coronavirus 2: NEGATIVE

## 2019-05-13 LAB — TSH: TSH: 3.176 u[IU]/mL (ref 0.350–4.500)

## 2019-05-13 SURGERY — ESOPHAGOGASTRODUODENOSCOPY (EGD) WITH PROPOFOL
Anesthesia: General

## 2019-05-13 MED ORDER — LIDOCAINE HCL (CARDIAC) PF 100 MG/5ML IV SOSY
PREFILLED_SYRINGE | INTRAVENOUS | Status: DC | PRN
  Administered 2019-05-13: 100 mg via INTRAVENOUS

## 2019-05-13 MED ORDER — ONDANSETRON HCL 4 MG/2ML IJ SOLN: 4.0000 mg | Freq: Four times a day (QID) | INTRAMUSCULAR | Status: DC | PRN

## 2019-05-13 MED ORDER — ONDANSETRON HCL 4 MG PO TABS: 4.0000 mg | ORAL_TABLET | Freq: Four times a day (QID) | ORAL | Status: DC | PRN

## 2019-05-13 MED ORDER — LIDOCAINE HCL (PF) 2 % IJ SOLN
INTRAMUSCULAR | Status: AC
  Filled 2019-05-13: qty 10

## 2019-05-13 MED ORDER — IOHEXOL 300 MG/ML  SOLN
75.0000 mL | Freq: Once | INTRAMUSCULAR | Status: AC | PRN
  Administered 2019-05-13: 75 mL via INTRAVENOUS

## 2019-05-13 MED ORDER — PROPOFOL 10 MG/ML IV BOLUS
INTRAVENOUS | Status: DC | PRN
  Administered 2019-05-13: 50 mg via INTRAVENOUS
  Administered 2019-05-13: 30 mg via INTRAVENOUS
  Administered 2019-05-13: 20 mg via INTRAVENOUS

## 2019-05-13 MED ORDER — PROPOFOL 500 MG/50ML IV EMUL
INTRAVENOUS | Status: DC | PRN
  Administered 2019-05-13: 150 ug/kg/min via INTRAVENOUS

## 2019-05-13 MED ORDER — PANTOPRAZOLE SODIUM 40 MG IV SOLR
40.0000 mg | Freq: Two times a day (BID) | INTRAVENOUS | Status: DC
  Administered 2019-05-13: 40 mg via INTRAVENOUS
  Filled 2019-05-13: qty 40

## 2019-05-13 MED ORDER — PROPOFOL 10 MG/ML IV BOLUS
INTRAVENOUS | Status: AC
  Filled 2019-05-13: qty 20

## 2019-05-13 MED ORDER — SODIUM CHLORIDE 0.9 % IV SOLN
INTRAVENOUS | Status: DC
  Administered 2019-05-13: 06:00:00 via INTRAVENOUS

## 2019-05-13 MED ORDER — PANTOPRAZOLE SODIUM 40 MG PO TBEC: 40.0000 mg | DELAYED_RELEASE_TABLET | Freq: Every day | ORAL | 0 refills | Status: AC

## 2019-05-13 MED ORDER — LORAZEPAM 2 MG/ML IJ SOLN: 1.0000 mg | Freq: Once | INTRAMUSCULAR | Status: DC

## 2019-05-13 MED ORDER — SODIUM CHLORIDE 0.9 % IV SOLN: INTRAVENOUS | Status: DC

## 2019-05-13 MED ORDER — SODIUM CHLORIDE 0.9% FLUSH: 3.0000 mL | Freq: Two times a day (BID) | INTRAVENOUS | Status: DC

## 2019-05-13 NOTE — Anesthesia Postprocedure Evaluation (Signed)
Anesthesia Post Note  Patient: GRAINGER MCCARLEY  Procedure(s) Performed: ESOPHAGOGASTRODUODENOSCOPY (EGD) WITH PROPOFOL (N/A )  Patient location during evaluation: Endoscopy Anesthesia Type: General Level of consciousness: awake and alert and oriented Pain management: pain level controlled Vital Signs Assessment: post-procedure vital signs reviewed and stable Respiratory status: spontaneous breathing, nonlabored ventilation and respiratory function stable Cardiovascular status: blood pressure returned to baseline and stable Postop Assessment: no signs of nausea or vomiting Anesthetic complications: no     Last Vitals:  Vitals:   05/13/19 1249 05/13/19 1319  BP: 90/64 106/82  Pulse:  64  Resp:  14  Temp:  36.4 C  SpO2:  100%    Last Pain:  Vitals:   05/13/19 1319  TempSrc: Oral  PainSc:                  Maeve Debord

## 2019-05-13 NOTE — ED Notes (Signed)
Patient is resting comfortably. 

## 2019-05-13 NOTE — Transfer of Care (Signed)
Immediate Anesthesia Transfer of Care Note  Patient: Zachary Fischer  Procedure(s) Performed: ESOPHAGOGASTRODUODENOSCOPY (EGD) WITH PROPOFOL (N/A )  Patient Location: PACU  Anesthesia Type:General  Level of Consciousness: sedated  Airway & Oxygen Therapy: Patient Spontanous Breathing and Patient connected to nasal cannula oxygen  Post-op Assessment: Report given to RN, Post -op Vital signs reviewed and stable and Patient moving all extremities X 4  Post vital signs: Reviewed and stable  Last Vitals:  Vitals Value Taken Time  BP 88/55 05/13/19 1233  Temp 36.1 C 05/13/19 1229  Pulse 78 05/13/19 1233  Resp 10 05/13/19 1233  SpO2 99 % 05/13/19 1233  Vitals shown include unvalidated device data.  Last Pain:  Vitals:   05/13/19 1229  TempSrc: Tympanic  PainSc: Asleep         Complications: No apparent anesthesia complications

## 2019-05-13 NOTE — Consult Note (Signed)
Zachary Fischer , MD [address] [place], [place], [place], [address] [id number] [place], [place], [place], [address] Phone: [phone]  Fax: [phone]  Consultation  Referring Provider:  Dr Benjie Karvonen  Primary Care Physician:  Susy Frizzle, MD Primary Gastroenterologist:  None          Reason for Consultation:     Dysphagia   Date of Admission:  05/12/2019 Date of Consultation:  05/13/2019         HPI:   Zachary Fischer is a 38 y.o. male presented to the ER yesterday with a history of difficulty swallowing for the last 3 days for solids but not for liquids. CT scan of the neck and chest was normal.  This morning he states that he still has the issue with swallowing solids but he appears comfortable not in any pain or distress  Past Medical History:  Diagnosis Date  . Allergy     History reviewed. No pertinent surgical history.  Prior to Admission medications   Not on File    Family History  Problem Relation Age of Onset  . Cancer Mother        stage 4 breast cancer  . Diabetes Mother   . Stroke Maternal Grandmother   . Heart disease Maternal Grandmother   . Kidney disease Maternal Grandmother   . Hypertension Maternal Grandmother   . Diabetes Maternal Grandmother   . Cancer Maternal Grandfather        colon  . Hypertension Paternal Grandmother   . Hypertension Paternal Grandfather   . Cancer Paternal Grandfather        colon cancer  . Diabetes Father   . Healthy Brother   . Healthy Sister      Social History   Tobacco Use  . Smoking status: Never Smoker  . Smokeless tobacco: Never Used  Substance Use Topics  . Alcohol use: No  . Drug use: No    Allergies as of 05/12/2019  . (No Known Allergies)    Review of Systems:    All systems reviewed and negative except where noted in HPI.   Physical Exam:  Vital signs in last 24 hours: Temp:  [97.8 F (36.6 C)-98.5 F (36.9 C)] 97.8 F (36.6 C) (08/19 0615) Pulse Rate:  [52-82] 62 (08/19 0615)  Resp:  [16-18] 16 (08/19 0615) BP: (100-123)/(67-83) 106/67 (08/19 0615) SpO2:  [95 %-100 %] 100 % (08/19 0615) Weight:  [78.9 kg-79.4 kg] 78.9 kg (08/19 0615) Last BM Date: 05/12/19 General:   Pleasant, cooperative in NAD Head:  Normocephalic and atraumatic. Eyes:   No icterus.   Conjunctiva pink. PERRLA. Ears:  Normal auditory acuity. Neck:  Supple; no masses or thyroidomegaly Lungs: Respirations even and unlabored. Lungs clear to auscultation bilaterally.   No wheezes, crackles, or rhonchi.  Heart:  Regular rate and rhythm;  Without murmur, clicks, rubs or gallops Abdomen:  Soft, nondistended, nontender. Normal bowel sounds. No appreciable masses or hepatomegaly.  No rebound or guarding.  Neurologic:  Alert and oriented x3;  grossly normal neurologically. Skin:  Intact without significant lesions or rashes. Cervical Nodes:  No significant cervical adenopathy. Psych:  Alert and cooperative. Normal affect.  LAB RESULTS: Recent Labs    05/12/19 2346 05/13/19 0621  WBC 6.9 6.4  HGB 14.3 14.4  HCT 41.6 42.2  PLT 179 176   BMET Recent Labs    05/12/19 2346 05/13/19 0621  NA 137 137  K 3.7 3.5  CL 100 100  CO2 27 27  GLUCOSE 82 88  BUN 9 9  CREATININE 0.82 0.87  CALCIUM 9.7 9.0   LFT Recent Labs    05/12/19 2346  PROT 7.7  ALBUMIN 4.6  AST 29  ALT 26  ALKPHOS 88  BILITOT 0.8   PT/INR Recent Labs    05/13/19 0621  LABPROT 13.7  INR 1.1    STUDIES: Ct Soft Tissue Neck W Contrast  Result Date: 05/13/2019 CLINICAL DATA:  Initial evaluation for acute dysphagia, unexplained. EXAM: CT NECK AND CHEST WITHOUT CONTRAST COMPARISON:  None. FINDINGS: CT NECK FINDINGS Pharynx and larynx: Oral cavity within normal limits without discrete mass or loculated fluid collection. No acute abnormality about the dentition. No base of tongue lesion. Palatine tonsils are hypertrophied and enlarged bilaterally, left slightly greater than right. No discrete tonsillar or peritonsillar  abscess. Parapharyngeal fat maintained. Adenoidal soft tissues prominent as well. No retropharyngeal collection. Epiglottis normal. Vallecula clear. Remainder of the hypopharynx and supraglottic larynx within normal limits. Glottis closed and not well assessed, but grossly unremarkable. Subglottic airway clear. Salivary glands: Salivary glands including the parotid and submandibular glands within normal limits. Thyroid: Thyroid normal. Lymph nodes: No pathologically enlarged lymph nodes seen within the neck. Vascular: Normal intravascular enhancement seen throughout the neck. Limited intracranial: Unremarkable. Visualized orbits: Unremarkable. Mastoids and visualized paranasal sinuses: Visualized paranasal sinuses are largely clear. Visualize mastoids and middle ear cavities are well pneumatized and free of fluid. Skeleton: No acute osseous abnormality. No discrete lytic or blastic osseous lesions. Upper chest: Assessed on CT chest portion of this exam. Other: None. CT CHEST FINDINGS Cardiovascular: Intrathoracic aorta normal in caliber without aneurysm or other acute abnormality. Partially visualized great vessels within normal limits. Heart size normal. No pericardial effusion. Limited assessment of the pulmonary arterial tree grossly unremarkable. Mediastinum/Nodes: No pathologically enlarged mediastinal, hilar, or axillary lymph nodes. Minimal soft tissue density within the anterior mediastinum felt to be consistent with normal residual thymic tissue. Esophagus decompressed without acute abnormality. Lungs/Pleura: Tracheobronchial tree intact and patent. Lungs well inflated bilaterally. No focal infiltrates. No pulmonary edema or pleural effusion. No pneumothorax. No worrisome pulmonary nodule or mass. Subcentimeter nodular density along the anterior right major fissure most consistent with a small intra pulmonic lymph node. Upper Abdomen: Visualized upper chest demonstrates no acute abnormality.  Musculoskeletal: No acute osseous abnormality. No discrete lytic or blastic osseous lesions. IMPRESSION: CT NECK IMPRESSION: 1. Bilateral tonsillar and adenoidal hypertrophy. Finding is nonspecific, and could be reactive in nature due to an underlying infectious process. An inflammatory/allergic reaction, changes related to exposure/irritant, or reflux disease could also be considered. No discrete tonsillar or peritonsillar abscess. 2. Otherwise negative neck CT. No other acute inflammatory changes or findings to explain patient's symptoms identified. CT CHEST IMPRESSION: Normal CT of the chest. No acute abnormality or findings to explain patient's symptoms identified. Electronically Signed   By: Jeannine Boga M.D.   On: 05/13/2019 01:17   Ct Chest W Contrast  Result Date: 05/13/2019 CLINICAL DATA:  Initial evaluation for acute dysphagia, unexplained. EXAM: CT NECK AND CHEST WITHOUT CONTRAST COMPARISON:  None. FINDINGS: CT NECK FINDINGS Pharynx and larynx: Oral cavity within normal limits without discrete mass or loculated fluid collection. No acute abnormality about the dentition. No base of tongue lesion. Palatine tonsils are hypertrophied and enlarged bilaterally, left slightly greater than right. No discrete tonsillar or peritonsillar abscess. Parapharyngeal fat maintained. Adenoidal soft tissues prominent as well. No retropharyngeal collection. Epiglottis normal. Vallecula clear. Remainder of the hypopharynx and  supraglottic larynx within normal limits. Glottis closed and not well assessed, but grossly unremarkable. Subglottic airway clear. Salivary glands: Salivary glands including the parotid and submandibular glands within normal limits. Thyroid: Thyroid normal. Lymph nodes: No pathologically enlarged lymph nodes seen within the neck. Vascular: Normal intravascular enhancement seen throughout the neck. Limited intracranial: Unremarkable. Visualized orbits: Unremarkable. Mastoids and visualized  paranasal sinuses: Visualized paranasal sinuses are largely clear. Visualize mastoids and middle ear cavities are well pneumatized and free of fluid. Skeleton: No acute osseous abnormality. No discrete lytic or blastic osseous lesions. Upper chest: Assessed on CT chest portion of this exam. Other: None. CT CHEST FINDINGS Cardiovascular: Intrathoracic aorta normal in caliber without aneurysm or other acute abnormality. Partially visualized great vessels within normal limits. Heart size normal. No pericardial effusion. Limited assessment of the pulmonary arterial tree grossly unremarkable. Mediastinum/Nodes: No pathologically enlarged mediastinal, hilar, or axillary lymph nodes. Minimal soft tissue density within the anterior mediastinum felt to be consistent with normal residual thymic tissue. Esophagus decompressed without acute abnormality. Lungs/Pleura: Tracheobronchial tree intact and patent. Lungs well inflated bilaterally. No focal infiltrates. No pulmonary edema or pleural effusion. No pneumothorax. No worrisome pulmonary nodule or mass. Subcentimeter nodular density along the anterior right major fissure most consistent with a small intra pulmonic lymph node. Upper Abdomen: Visualized upper chest demonstrates no acute abnormality. Musculoskeletal: No acute osseous abnormality. No discrete lytic or blastic osseous lesions. IMPRESSION: CT NECK IMPRESSION: 1. Bilateral tonsillar and adenoidal hypertrophy. Finding is nonspecific, and could be reactive in nature due to an underlying infectious process. An inflammatory/allergic reaction, changes related to exposure/irritant, or reflux disease could also be considered. No discrete tonsillar or peritonsillar abscess. 2. Otherwise negative neck CT. No other acute inflammatory changes or findings to explain patient's symptoms identified. CT CHEST IMPRESSION: Normal CT of the chest. No acute abnormality or findings to explain patient's symptoms identified.  Electronically Signed   By: Jeannine Boga M.D.   On: 05/13/2019 01:17      Impression / Plan:   RAEGAN SIPP is a 38 y.o. y/o male presented to the ER today with dysphagia mainly for solids but not full liquids.  Describes a sensation of gagging.   Plan   1. EGD   I have discussed alternative options, risks & benefits,  which include, but are not limited to, bleeding, infection, perforation,respiratory complication & drug reaction.  The patient agrees with this plan & written consent will be obtained.     Thank you for involving me in the care of this patient.      LOS: 0 days   Zachary Bellows, MD  05/13/2019, 8:22 AM

## 2019-05-13 NOTE — Progress Notes (Signed)
MD ordered patient to be discharged home.  Discharge instructions were reviewed with the patient and he voiced understanding.  Follow-up appointment was made.  Prescriptions sent to the patients pharmacy.  IV was removed with catheter intact.  All patients questions were answered.  Patient drove himself to the Er last night.  I talked to him about needing to get someone to come pick him up and drive him home.  He was not able to get a ride and was insistent on leaving this afternoon.  He got up and walked around the nurses station and said he felt fine.  So he was taken out by the NT.

## 2019-05-13 NOTE — H&P (Signed)
[facility] [place] NAME: Zachary Fischer    MR#:  [id number]  DATE OF BIRTH:  May 14, 1981  DATE OF ADMISSION:  05/12/2019  PRIMARY CARE PHYSICIAN: Susy Frizzle, MD   REQUESTING/REFERRING PHYSICIAN: Rudene Re, MD  CHIEF COMPLAINT:   Chief Complaint  Patient presents with   Nasal Congestion    HISTORY OF PRESENT ILLNESS:  Zachary Fischer  is a 38 y.o. male presented to the emergency room complaining of a globulus sensation present over the last 4 days.  He reports he was eating and tolerating both food and liquids fine until abruptly he was eating 2 days ago and began experiencing difficulty swallowing and that he would get choked on food occasionally.  He may take a couple of bites and then get choked on the third bite resulting in vomiting all to gastric contents.  He continues to be able to manage small amounts of fluids without vomiting.  He had tried Ensure earlier in the day however was unable to tolerate this.  He denies hoarseness.  He denies sore throat however endorses postnasal drainage of thick mucus.  He denies fevers, chills, shortness of breath, chest pain, abdominal pain.  He has no prior history of dysphagia.  He does not have a history of GERD that he is aware of.  P.o. challenge test was performed in the emergency room and patient could not pass this.  Therefore gastroenterology with Dr. Bonna Gains was consulted by the ED physician and is planning endoscopy in the a.m.  CT of neck was performed with no evidence of obstruction seen.  He has been admitted to the hospitalist service for observation and pending endoscopy procedure.  PAST MEDICAL HISTORY:   Past Medical History:  Diagnosis Date   Allergy     PAST SURGICAL HISTORY:  History reviewed. No pertinent surgical history.  SOCIAL HISTORY:   Social History   Tobacco Use   Smoking status: Never Smoker   Smokeless tobacco: Never Used  Substance Use  Topics   Alcohol use: No    FAMILY HISTORY:   Family History  Problem Relation Age of Onset   Cancer Mother        stage 4 breast cancer   Diabetes Mother    Stroke Maternal Grandmother    Heart disease Maternal Grandmother    Kidney disease Maternal Grandmother    Hypertension Maternal Grandmother    Diabetes Maternal Grandmother    Cancer Maternal Grandfather        colon   Hypertension Paternal Grandmother    Hypertension Paternal Grandfather    Cancer Paternal Grandfather        colon cancer   Diabetes Father    Healthy Brother    Healthy Sister     DRUG ALLERGIES:  No Known Allergies  REVIEW OF SYSTEMS:   Review of Systems  Constitutional: Negative for chills, fever and malaise/fatigue.  HENT: Negative for congestion, sinus pain and sore throat.   Eyes: Negative for blurred vision and double vision.  Respiratory: Negative for cough, hemoptysis, shortness of breath and wheezing.   Cardiovascular: Negative for chest pain and palpitations.  Gastrointestinal: Positive for vomiting. Negative for abdominal pain, blood in stool, constipation, diarrhea, heartburn, melena and nausea.  Genitourinary: Negative for dysuria, flank pain, frequency and urgency.  Musculoskeletal: Negative for falls and myalgias.  Skin: Negative for itching and rash.  Neurological: Negative for dizziness, weakness and headaches.  Psychiatric/Behavioral: Negative for depression.  MEDICATIONS AT HOME:   Prior to Admission medications   Not on File      VITAL SIGNS:  Blood pressure 123/79, pulse 82, temperature 97.8 F (36.6 C), temperature source Oral, resp. rate 18, height 5\' 7"  (1.702 m), weight 79.4 kg, SpO2 100 %.  PHYSICAL EXAMINATION:  Physical Exam  GENERAL:  38 y.o.-year-old patient lying in the bed with no acute distress.  EYES: Pupils equal, round, reactive to light and accommodation. No scleral icterus. Extraocular muscles intact.  HEENT: Head atraumatic,  normocephalic. Oropharynx and nasopharynx clear.  NECK:  Supple, no jugular venous distention. No thyroid enlargement, no tenderness.  LUNGS: Normal breath sounds bilaterally, no wheezing, rales,rhonchi or crepitation. No use of accessory muscles of respiration.  CARDIOVASCULAR: Regular rate and rhythm, S1, S2 normal. No murmurs, rubs, or gallops.  ABDOMEN: Soft, nondistended, nontender. Bowel sounds present. No organomegaly or mass.  EXTREMITIES: No pedal edema, cyanosis, or clubbing.  NEUROLOGIC: Cranial nerves II through XII are intact. Muscle strength 5/5 in all extremities. Sensation intact. Gait not checked.  PSYCHIATRIC: The patient is alert and oriented x 3.  Normal affect and good eye contact. SKIN: No obvious rash, lesion, or ulcer.   LABORATORY PANEL:   CBC Recent Labs  Lab 05/12/19 2346  WBC 6.9  HGB 14.3  HCT 41.6  PLT 179   ------------------------------------------------------------------------------------------------------------------  Chemistries  Recent Labs  Lab 05/12/19 2346  NA 137  K 3.7  CL 100  CO2 27  GLUCOSE 82  BUN 9  CREATININE 0.82  CALCIUM 9.7  AST 29  ALT 26  ALKPHOS 88  BILITOT 0.8   ------------------------------------------------------------------------------------------------------------------  Cardiac Enzymes No results for input(s): TROPONINI in the last 168 hours. ------------------------------------------------------------------------------------------------------------------  RADIOLOGY:  Ct Soft Tissue Neck W Contrast  Result Date: 05/13/2019 CLINICAL DATA:  Initial evaluation for acute dysphagia, unexplained. EXAM: CT NECK AND CHEST WITHOUT CONTRAST COMPARISON:  None. FINDINGS: CT NECK FINDINGS Pharynx and larynx: Oral cavity within normal limits without discrete mass or loculated fluid collection. No acute abnormality about the dentition. No base of tongue lesion. Palatine tonsils are hypertrophied and enlarged bilaterally,  left slightly greater than right. No discrete tonsillar or peritonsillar abscess. Parapharyngeal fat maintained. Adenoidal soft tissues prominent as well. No retropharyngeal collection. Epiglottis normal. Vallecula clear. Remainder of the hypopharynx and supraglottic larynx within normal limits. Glottis closed and not well assessed, but grossly unremarkable. Subglottic airway clear. Salivary glands: Salivary glands including the parotid and submandibular glands within normal limits. Thyroid: Thyroid normal. Lymph nodes: No pathologically enlarged lymph nodes seen within the neck. Vascular: Normal intravascular enhancement seen throughout the neck. Limited intracranial: Unremarkable. Visualized orbits: Unremarkable. Mastoids and visualized paranasal sinuses: Visualized paranasal sinuses are largely clear. Visualize mastoids and middle ear cavities are well pneumatized and free of fluid. Skeleton: No acute osseous abnormality. No discrete lytic or blastic osseous lesions. Upper chest: Assessed on CT chest portion of this exam. Other: None. CT CHEST FINDINGS Cardiovascular: Intrathoracic aorta normal in caliber without aneurysm or other acute abnormality. Partially visualized great vessels within normal limits. Heart size normal. No pericardial effusion. Limited assessment of the pulmonary arterial tree grossly unremarkable. Mediastinum/Nodes: No pathologically enlarged mediastinal, hilar, or axillary lymph nodes. Minimal soft tissue density within the anterior mediastinum felt to be consistent with normal residual thymic tissue. Esophagus decompressed without acute abnormality. Lungs/Pleura: Tracheobronchial tree intact and patent. Lungs well inflated bilaterally. No focal infiltrates. No pulmonary edema or pleural effusion. No pneumothorax. No worrisome pulmonary nodule or mass. Subcentimeter nodular  density along the anterior right major fissure most consistent with a small intra pulmonic lymph node. Upper Abdomen:  Visualized upper chest demonstrates no acute abnormality. Musculoskeletal: No acute osseous abnormality. No discrete lytic or blastic osseous lesions. IMPRESSION: CT NECK IMPRESSION: 1. Bilateral tonsillar and adenoidal hypertrophy. Finding is nonspecific, and could be reactive in nature due to an underlying infectious process. An inflammatory/allergic reaction, changes related to exposure/irritant, or reflux disease could also be considered. No discrete tonsillar or peritonsillar abscess. 2. Otherwise negative neck CT. No other acute inflammatory changes or findings to explain patient's symptoms identified. CT CHEST IMPRESSION: Normal CT of the chest. No acute abnormality or findings to explain patient's symptoms identified. Electronically Signed   By: Jeannine Boga M.D.   On: 05/13/2019 01:17   Ct Chest W Contrast  Result Date: 05/13/2019 CLINICAL DATA:  Initial evaluation for acute dysphagia, unexplained. EXAM: CT NECK AND CHEST WITHOUT CONTRAST COMPARISON:  None. FINDINGS: CT NECK FINDINGS Pharynx and larynx: Oral cavity within normal limits without discrete mass or loculated fluid collection. No acute abnormality about the dentition. No base of tongue lesion. Palatine tonsils are hypertrophied and enlarged bilaterally, left slightly greater than right. No discrete tonsillar or peritonsillar abscess. Parapharyngeal fat maintained. Adenoidal soft tissues prominent as well. No retropharyngeal collection. Epiglottis normal. Vallecula clear. Remainder of the hypopharynx and supraglottic larynx within normal limits. Glottis closed and not well assessed, but grossly unremarkable. Subglottic airway clear. Salivary glands: Salivary glands including the parotid and submandibular glands within normal limits. Thyroid: Thyroid normal. Lymph nodes: No pathologically enlarged lymph nodes seen within the neck. Vascular: Normal intravascular enhancement seen throughout the neck. Limited intracranial: Unremarkable.  Visualized orbits: Unremarkable. Mastoids and visualized paranasal sinuses: Visualized paranasal sinuses are largely clear. Visualize mastoids and middle ear cavities are well pneumatized and free of fluid. Skeleton: No acute osseous abnormality. No discrete lytic or blastic osseous lesions. Upper chest: Assessed on CT chest portion of this exam. Other: None. CT CHEST FINDINGS Cardiovascular: Intrathoracic aorta normal in caliber without aneurysm or other acute abnormality. Partially visualized great vessels within normal limits. Heart size normal. No pericardial effusion. Limited assessment of the pulmonary arterial tree grossly unremarkable. Mediastinum/Nodes: No pathologically enlarged mediastinal, hilar, or axillary lymph nodes. Minimal soft tissue density within the anterior mediastinum felt to be consistent with normal residual thymic tissue. Esophagus decompressed without acute abnormality. Lungs/Pleura: Tracheobronchial tree intact and patent. Lungs well inflated bilaterally. No focal infiltrates. No pulmonary edema or pleural effusion. No pneumothorax. No worrisome pulmonary nodule or mass. Subcentimeter nodular density along the anterior right major fissure most consistent with a small intra pulmonic lymph node. Upper Abdomen: Visualized upper chest demonstrates no acute abnormality. Musculoskeletal: No acute osseous abnormality. No discrete lytic or blastic osseous lesions. IMPRESSION: CT NECK IMPRESSION: 1. Bilateral tonsillar and adenoidal hypertrophy. Finding is nonspecific, and could be reactive in nature due to an underlying infectious process. An inflammatory/allergic reaction, changes related to exposure/irritant, or reflux disease could also be considered. No discrete tonsillar or peritonsillar abscess. 2. Otherwise negative neck CT. No other acute inflammatory changes or findings to explain patient's symptoms identified. CT CHEST IMPRESSION: Normal CT of the chest. No acute abnormality or  findings to explain patient's symptoms identified. Electronically Signed   By: Jeannine Boga M.D.   On: 05/13/2019 01:17      IMPRESSION AND PLAN:   1.  Esophageal obstruction - N.p.o. status -Normal saline infusing to peripheral IV at 75 cc/h - Gastroenterology, Dr. Bonna Gains, consulted planning endoscopy  procedure this a.m.  2. GERD -PPI therapy initiated  3. Anxiety - We will treat with IV Ativan as needed  DVT prophylaxis with SCDs and PPI prophylaxis initiated    All the records are reviewed and case discussed with ED provider. The plan of care was discussed in details with the patient (and family). I answered all questions. The patient agreed to proceed with the above mentioned plan. Further management will depend upon hospital course.   CODE STATUS: Full code  TOTAL TIME TAKING CARE OF THIS PATIENT:45 minutes.    [place] on 05/13/2019 at 4:28 AM  Pager - [phone]  After 6pm go to [email or web address] - Proofreader  [facility]  Office  [phone]  CC: Primary care physician; Susy Frizzle, MD   Note: This dictation was prepared with Dragon dictation along with smaller phrase technology. Any transcriptional errors that result from this process are unintentional.

## 2019-05-13 NOTE — Discharge Summary (Addendum)
[facility] [place] NAME: Zachary Fischer    MR#:  [id number]  DATE OF BIRTH:  03-Jan-1981  DATE OF ADMISSION:  05/12/2019 ADMITTING PHYSICIAN: Otila Back, MD  DATE OF DISCHARGE: 05/13/2019  PRIMARY CARE PHYSICIAN: Susy Frizzle, MD    ADMISSION DIAGNOSIS:  Globus sensation [R09.89] Esophageal obstruction [K22.2]  DISCHARGE DIAGNOSIS:  Active Problems:   Esophageal obstruction   SECONDARY DIAGNOSIS:   Past Medical History:  Diagnosis Date  . Allergy     HOSPITAL COURSE:  *38 year old male who presented to the emergency room due to difficulty swallowing. 1.  Dysphagia for solids and liquids: Patient is status post EGD. EGD shows nonbleeding esophageal ulcer which was biopsied.  Normal mucosa was found in the entire esophagus.  He does have evidence of gastritis.  GI has recommended PPI and follow-up in 2 weeks.  He should avoid NSAIDs. DISCHARGE CONDITIONS AND DIET:   Stable regular diet  CONSULTS OBTAINED:    DRUG ALLERGIES:  No Known Allergies  DISCHARGE MEDICATIONS:   Allergies as of 05/13/2019   No Known Allergies     Medication List    TAKE these medications   pantoprazole 40 MG tablet Commonly known as: Protonix Take 1 tablet (40 mg total) by mouth daily.         Today   CHIEF COMPLAINT:  Underwent EGD this am    VITAL SIGNS:  Blood pressure (!) 103/59, pulse (!) 56, temperature (!) 97 F (36.1 C), temperature source Tympanic, resp. rate 17, height 5\' 7"  (1.702 m), weight 78.5 kg, SpO2 99 %.   REVIEW OF SYSTEMS:  Review of Systems  Constitutional: Negative.  Negative for chills, fever and malaise/fatigue.  HENT: Negative.  Negative for ear discharge, ear pain, hearing loss, nosebleeds and sore throat.   Eyes: Negative.  Negative for blurred vision and pain.  Respiratory: Negative.  Negative for cough, hemoptysis, shortness of breath and wheezing.   Cardiovascular: Negative.  Negative for chest  pain, palpitations and leg swelling.  Gastrointestinal: Negative.  Negative for abdominal pain, blood in stool, diarrhea, nausea and vomiting.  Genitourinary: Negative.  Negative for dysuria.  Musculoskeletal: Negative.  Negative for back pain.  Skin: Negative.   Neurological: Negative for dizziness, tremors, speech change, focal weakness, seizures and headaches.  Endo/Heme/Allergies: Negative.  Does not bruise/bleed easily.  Psychiatric/Behavioral: Negative.  Negative for depression, hallucinations and suicidal ideas.     PHYSICAL EXAMINATION:    DATA REVIEW:   CBC Recent Labs  Lab 05/13/19 0621  WBC 6.4  HGB 14.4  HCT 42.2  PLT 176    Chemistries  Recent Labs  Lab 05/12/19 2346 05/13/19 0621  NA 137 137  K 3.7 3.5  CL 100 100  CO2 27 27  GLUCOSE 82 88  BUN 9 9  CREATININE 0.82 0.87  CALCIUM 9.7 9.0  AST 29  --   ALT 26  --   ALKPHOS 88  --   BILITOT 0.8  --     Cardiac Enzymes No results for input(s): TROPONINI in the last 168 hours.  Microbiology Results  @[id number] @  RADIOLOGY:  Ct Soft Tissue Neck W Contrast  Result Date: 05/13/2019 CLINICAL DATA:  Initial evaluation for acute dysphagia, unexplained. EXAM: CT NECK AND CHEST WITHOUT CONTRAST COMPARISON:  None. FINDINGS: CT NECK FINDINGS Pharynx and larynx: Oral cavity within normal limits without discrete mass or loculated fluid collection. No acute abnormality about the dentition. No base of tongue lesion. Palatine  tonsils are hypertrophied and enlarged bilaterally, left slightly greater than right. No discrete tonsillar or peritonsillar abscess. Parapharyngeal fat maintained. Adenoidal soft tissues prominent as well. No retropharyngeal collection. Epiglottis normal. Vallecula clear. Remainder of the hypopharynx and supraglottic larynx within normal limits. Glottis closed and not well assessed, but grossly unremarkable. Subglottic airway clear. Salivary glands: Salivary glands including the parotid and  submandibular glands within normal limits. Thyroid: Thyroid normal. Lymph nodes: No pathologically enlarged lymph nodes seen within the neck. Vascular: Normal intravascular enhancement seen throughout the neck. Limited intracranial: Unremarkable. Visualized orbits: Unremarkable. Mastoids and visualized paranasal sinuses: Visualized paranasal sinuses are largely clear. Visualize mastoids and middle ear cavities are well pneumatized and free of fluid. Skeleton: No acute osseous abnormality. No discrete lytic or blastic osseous lesions. Upper chest: Assessed on CT chest portion of this exam. Other: None. CT CHEST FINDINGS Cardiovascular: Intrathoracic aorta normal in caliber without aneurysm or other acute abnormality. Partially visualized great vessels within normal limits. Heart size normal. No pericardial effusion. Limited assessment of the pulmonary arterial tree grossly unremarkable. Mediastinum/Nodes: No pathologically enlarged mediastinal, hilar, or axillary lymph nodes. Minimal soft tissue density within the anterior mediastinum felt to be consistent with normal residual thymic tissue. Esophagus decompressed without acute abnormality. Lungs/Pleura: Tracheobronchial tree intact and patent. Lungs well inflated bilaterally. No focal infiltrates. No pulmonary edema or pleural effusion. No pneumothorax. No worrisome pulmonary nodule or mass. Subcentimeter nodular density along the anterior right major fissure most consistent with a small intra pulmonic lymph node. Upper Abdomen: Visualized upper chest demonstrates no acute abnormality. Musculoskeletal: No acute osseous abnormality. No discrete lytic or blastic osseous lesions. IMPRESSION: CT NECK IMPRESSION: 1. Bilateral tonsillar and adenoidal hypertrophy. Finding is nonspecific, and could be reactive in nature due to an underlying infectious process. An inflammatory/allergic reaction, changes related to exposure/irritant, or reflux disease could also be  considered. No discrete tonsillar or peritonsillar abscess. 2. Otherwise negative neck CT. No other acute inflammatory changes or findings to explain patient's symptoms identified. CT CHEST IMPRESSION: Normal CT of the chest. No acute abnormality or findings to explain patient's symptoms identified. Electronically Signed   By: Jeannine Boga M.D.   On: 05/13/2019 01:17   Ct Chest W Contrast  Result Date: 05/13/2019 CLINICAL DATA:  Initial evaluation for acute dysphagia, unexplained. EXAM: CT NECK AND CHEST WITHOUT CONTRAST COMPARISON:  None. FINDINGS: CT NECK FINDINGS Pharynx and larynx: Oral cavity within normal limits without discrete mass or loculated fluid collection. No acute abnormality about the dentition. No base of tongue lesion. Palatine tonsils are hypertrophied and enlarged bilaterally, left slightly greater than right. No discrete tonsillar or peritonsillar abscess. Parapharyngeal fat maintained. Adenoidal soft tissues prominent as well. No retropharyngeal collection. Epiglottis normal. Vallecula clear. Remainder of the hypopharynx and supraglottic larynx within normal limits. Glottis closed and not well assessed, but grossly unremarkable. Subglottic airway clear. Salivary glands: Salivary glands including the parotid and submandibular glands within normal limits. Thyroid: Thyroid normal. Lymph nodes: No pathologically enlarged lymph nodes seen within the neck. Vascular: Normal intravascular enhancement seen throughout the neck. Limited intracranial: Unremarkable. Visualized orbits: Unremarkable. Mastoids and visualized paranasal sinuses: Visualized paranasal sinuses are largely clear. Visualize mastoids and middle ear cavities are well pneumatized and free of fluid. Skeleton: No acute osseous abnormality. No discrete lytic or blastic osseous lesions. Upper chest: Assessed on CT chest portion of this exam. Other: None. CT CHEST FINDINGS Cardiovascular: Intrathoracic aorta normal in caliber  without aneurysm or other acute abnormality. Partially visualized great vessels within  normal limits. Heart size normal. No pericardial effusion. Limited assessment of the pulmonary arterial tree grossly unremarkable. Mediastinum/Nodes: No pathologically enlarged mediastinal, hilar, or axillary lymph nodes. Minimal soft tissue density within the anterior mediastinum felt to be consistent with normal residual thymic tissue. Esophagus decompressed without acute abnormality. Lungs/Pleura: Tracheobronchial tree intact and patent. Lungs well inflated bilaterally. No focal infiltrates. No pulmonary edema or pleural effusion. No pneumothorax. No worrisome pulmonary nodule or mass. Subcentimeter nodular density along the anterior right major fissure most consistent with a small intra pulmonic lymph node. Upper Abdomen: Visualized upper chest demonstrates no acute abnormality. Musculoskeletal: No acute osseous abnormality. No discrete lytic or blastic osseous lesions. IMPRESSION: CT NECK IMPRESSION: 1. Bilateral tonsillar and adenoidal hypertrophy. Finding is nonspecific, and could be reactive in nature due to an underlying infectious process. An inflammatory/allergic reaction, changes related to exposure/irritant, or reflux disease could also be considered. No discrete tonsillar or peritonsillar abscess. 2. Otherwise negative neck CT. No other acute inflammatory changes or findings to explain patient's symptoms identified. CT CHEST IMPRESSION: Normal CT of the chest. No acute abnormality or findings to explain patient's symptoms identified. Electronically Signed   By: Jeannine Boga M.D.   On: 05/13/2019 01:17      Allergies as of 05/13/2019   No Known Allergies     Medication List    TAKE these medications   pantoprazole 40 MG tablet Commonly known as: Protonix Take 1 tablet (40 mg total) by mouth daily.          Management plans discussed with the patient and he is in agreement. Stable for  discharge home  Patient should follow up with dr Vicente Males  CODE STATUS:     Code Status Orders  (From admission, onward)         Start     Ordered   05/13/19 0428  Full code  Continuous     05/13/19 0427        Code Status History    This patient has a current code status but no historical code status.   Advance Care Planning Activity      TOTAL TIME TAKING CARE OF THIS PATIENT: 30 minutes.    Note: This dictation was prepared with Dragon dictation along with smaller phrase technology. Any transcriptional errors that result from this process are unintentional.  Bettey Costa M.D on 05/13/2019 at 12:31 PM  Between 7am to 6pm - Pager - [phone] After 6pm go to [email or web address] - password [email or web address] [facility]  Office  [phone]  CC: Primary care physician; Susy Frizzle, MD

## 2019-05-13 NOTE — Op Note (Signed)
[facility] Gastroenterology Patient Name: Zachary Fischer Procedure Date: 05/13/2019 12:11 PM MRN: [id number] Account #: [id number] Date of Birth: [birth year] Admit Type: Inpatient Age: 38 Room: [facility] ENDO ROOM 3 Gender: Male Note Status: Finalized Procedure:            Upper GI endoscopy Indications:          Dysphagia Providers:            Jonathon Bellows MD, MD Referring MD:         Cammie Mcgee. Pickard (Referring MD) Medicines:            Monitored Anesthesia Care Complications:        No immediate complications. Procedure:            Pre-Anesthesia Assessment:                       - Prior to the procedure, a History and Physical was                        performed, and patient medications, allergies and                        sensitivities were reviewed. The patient's tolerance of                        previous anesthesia was reviewed.                       - The risks and benefits of the procedure and the                        sedation options and risks were discussed with the                        patient. All questions were answered and informed                        consent was obtained.                       - ASA Grade Assessment: II - A patient with mild                        systemic disease.                       After obtaining informed consent, the endoscope was                        passed under direct vision. Throughout the procedure,                        the patient's blood pressure, pulse, and oxygen                        saturations were monitored continuously. The Endoscope                        was introduced through the mouth, and advanced to the  third part of duodenum. The upper GI endoscopy was                        accomplished with ease. The patient tolerated the                        procedure well. Findings:      The examined duodenum was normal.      Localized moderate inflammation characterized by  congestion (edema),       erosions, erythema and aphthous ulcerations was found in the gastric       antrum. Biopsies were taken with a cold forceps for histology.      The cardia and gastric fundus were normal on retroflexion.      One superficial esophageal ulcer with no bleeding and no stigmata of       recent bleeding was found at the gastroesophageal junction. The lesion       was 5 mm in largest dimension. Biopsies were taken with a cold forceps       for histology.      Normal mucosa was found in the entire esophagus. Biopsies were taken       with a cold forceps for histology. Impression:           - Normal examined duodenum.                       - Gastritis. Biopsied.                       - Non-bleeding esophageal ulcer. Biopsied.                       - Normal mucosa was found in the entire esophagus.                        Biopsied. Recommendation:       - Return patient to hospital ward for possible                        discharge same day.                       - Advance diet as tolerated.                       - Use Prilosec (omeprazole) 40 mg PO daily today.                       - Await pathology results.                       - Return to my office in 2 weeks. Procedure Code(s):    --- Professional ---                       [phone], Esophagogastroduodenoscopy, flexible, transoral;                        with biopsy, single or multiple Diagnosis Code(s):    --- Professional ---                       K29.70, Gastritis, unspecified, without bleeding  K22.10, Ulcer of esophagus without bleeding                       R13.10, Dysphagia, unspecified CPT copyright 2019 [employer] Association. All rights reserved. The codes documented in this report are preliminary and upon coder review may  be revised to meet current compliance requirements. Jonathon Bellows, MD Jonathon Bellows MD, MD 05/13/2019 12:25:42 PM This report has been signed electronically. Number  of Addenda: 0 Note Initiated On: 05/13/2019 12:11 PM Total Procedure Duration: 0 hours 4 minutes 48 seconds  Estimated Blood Loss: Estimated blood loss: none.      [facility]

## 2019-05-13 NOTE — ED Notes (Addendum)
Pt laying on his right side. MD asked pt to try graham crackers and gingerale. Pt states he vomited what he tried to take in. Pt continues to maintain secretions. Will notify MD.

## 2019-05-13 NOTE — ED Provider Notes (Signed)
[facility] Emergency Department Provider Note  ____________________________________________  Time seen: Approximately 12:37 AM  I have reviewed the triage vital signs and the nursing notes.   HISTORY  Chief Complaint Nasal Congestion    HPI Zachary Fischer is a 38 y.o. male presents to the emergency department with a globus sensation over the past 4 days.  Patient states that he has been unable to eat for the past 2 to 3 days.  He can manage a small amount of fluids.  Patient states that he went and bought Ensure today as he cannot eat and was only able to drink a small amount.  Patient states that he is particularly uncomfortable in a supine position and has to sit up most of the time.  Patient states that he has never experienced similar issues in the past.  He cannot identify a precipitating meal or ingestion in the recent past that seem to precipitate symptoms.  He denies chest pain, chest tightness, shortness of breath or abdominal pain.  He denies fever at home.  No alleviating measures have been attempted.        Past Medical History:  Diagnosis Date  . Allergy     Patient Active Problem List   Diagnosis Date Noted  . Routine general medical examination at a health care facility 05/29/2013    History reviewed. No pertinent surgical history.  Prior to Admission medications   Not on File    Allergies Patient has no known allergies.  Family History  Problem Relation Age of Onset  . Cancer Mother        stage 4 breast cancer  . Diabetes Mother   . Stroke Maternal Grandmother   . Heart disease Maternal Grandmother   . Kidney disease Maternal Grandmother   . Hypertension Maternal Grandmother   . Diabetes Maternal Grandmother   . Cancer Maternal Grandfather        colon  . Hypertension Paternal Grandmother   . Hypertension Paternal Grandfather   . Cancer Paternal Grandfather        colon cancer  . Diabetes Father   . Healthy Brother    . Healthy Sister     Social History Social History   Tobacco Use  . Smoking status: Never Smoker  . Smokeless tobacco: Never Used  Substance Use Topics  . Alcohol use: No  . Drug use: No     Review of Systems  Constitutional: No fever/chills Eyes: No visual changes. No discharge ENT: Patient has globus sensation.  Cardiovascular: no chest pain. Respiratory: no cough. No SOB. Gastrointestinal: No abdominal pain.  No nausea, no vomiting.  No diarrhea.  No constipation. Genitourinary: Negative for dysuria. No hematuria Musculoskeletal: Negative for musculoskeletal pain. Skin: Negative for rash, abrasions, lacerations, ecchymosis. Neurological: Negative for headaches, focal weakness or numbness.   ____________________________________________   PHYSICAL EXAM:  VITAL SIGNS: ED Triage Vitals  Enc Vitals Group     BP 05/12/19 2216 118/76     Pulse Rate 05/12/19 2216 66     Resp 05/12/19 2216 18     Temp 05/12/19 2216 98.5 F (36.9 C)     Temp Source 05/12/19 2216 Oral     SpO2 05/12/19 2216 95 %     Weight 05/12/19 2217 175 lb (79.4 kg)     Height 05/12/19 2217 5\' 7"  (1.702 m)     Head Circumference --      Peak Flow --      Pain Score 05/12/19  2217 0     Pain Loc --      Pain Edu? --      Excl. in [place]? --      Constitutional: Alert and oriented. Well appearing and in no acute distress.  Patient is able to speak in complete sentences and is maintaining his own secretions. Eyes: Conjunctivae are normal. PERRL. EOMI. Head: Atraumatic. ENT:      Ears: TMs are pearly.       Nose: No congestion/rhinnorhea.      Mouth/Throat: Mucous membranes are moist.  Posterior pharynx is nonerythematous. Neck: No stridor.  No cervical spine tenderness to palpation. Hematological/Lymphatic/Immunilogical: No cervical lymphadenopathy. Cardiovascular: Normal rate, regular rhythm. Normal S1 and S2.  Good peripheral circulation. Respiratory: Normal respiratory effort without tachypnea  or retractions. Lungs CTAB. Good air entry to the bases with no decreased or absent breath sounds. Gastrointestinal: Bowel sounds 4 quadrants. Soft and nontender to palpation. No guarding or rigidity. No palpable masses. No distention. No CVA tenderness. Musculoskeletal: Full range of motion to all extremities. No gross deformities appreciated. Neurologic:  Normal speech and language. No gross focal neurologic deficits are appreciated.  Skin:  Skin is warm, dry and intact. No rash noted. Psychiatric: Mood and affect are normal. Speech and behavior are normal. Patient exhibits appropriate insight and judgement.   ____________________________________________   LABS (all labs ordered are listed, but only abnormal results are displayed)  Labs Reviewed  CBC WITH DIFFERENTIAL/PLATELET  COMPREHENSIVE METABOLIC PANEL   ____________________________________________  EKG   ____________________________________________  RADIOLOGY   No results found.  ____________________________________________    PROCEDURES  Procedure(s) performed:    Procedures    Medications  glucagon (human recombinant) (GLUCAGEN) injection 1 mg (1 mg Intravenous Given 05/12/19 2357)  iohexol (OMNIPAQUE) 300 MG/ML solution 75 mL (75 mLs Intravenous Contrast Given 05/13/19 0031)     ____________________________________________   INITIAL IMPRESSION / ASSESSMENT AND PLAN / ED COURSE  Pertinent labs & imaging results that were available during my care of the patient were reviewed by me and considered in my medical decision making (see chart for details).  Review of the [place] CSRS was performed in accordance of the [place] prior to dispensing any controlled drugs.           Assessment and plan Globus sensation 38 year old male presents to the emergency department with globus sensation for the past 4 days.  Patient has been unable to eat during this time and states that he is only been able to maintain a  small amount of liquids.  Vital signs are reassuring on exam.  Patient appeared to be resting comfortably in no apparent distress.  He was not using emesis bag and was able to speak in complete sentences.  Differential diagnosis includes food impaction versus GERD.   I consulted GI specialist on call, Dr. Bonna Gains who recommended CT chest and CT soft tissue neck with IV glucagon.  Patient was transitioned to main side of the emergency department for further care and management.  Dr. Alfred Levins  assumed care.   ____________________________________________  FINAL CLINICAL IMPRESSION(S) / ED DIAGNOSES  Final diagnoses:  Globus sensation      NEW MEDICATIONS STARTED DURING THIS VISIT:  ED Discharge Orders    None          This chart was dictated using voice recognition software/Dragon. Despite best efforts to proofread, errors can occur which can change the meaning. Any change was purely unintentional.    Lannie Fields, PA-C 05/13/19 [phone]  Alfred Levins, [place], MD 05/13/19 0400

## 2019-05-13 NOTE — Anesthesia Preprocedure Evaluation (Addendum)
Anesthesia Evaluation  Patient identified by MRN, date of birth, ID band Patient awake    Reviewed: Allergy & Precautions, NPO status , Patient's Chart, lab work & pertinent test results  History of Anesthesia Complications Negative for: history of anesthetic complications  Airway Mallampati: II  TM Distance: >3 FB Neck ROM: Full    Dental no notable dental hx.    Pulmonary neg pulmonary ROS, neg sleep apnea, neg COPD,    breath sounds clear to auscultation- rhonchi (-) wheezing      Cardiovascular Exercise Tolerance: Good (-) hypertension(-) CAD and (-) Past MI  Rhythm:Regular Rate:Normal - Systolic murmurs and - Diastolic murmurs    Neuro/Psych negative neurological ROS  negative psych ROS   GI/Hepatic negative GI ROS, Neg liver ROS,   Endo/Other  negative endocrine ROSneg diabetes  Renal/GU negative Renal ROS     Musculoskeletal negative musculoskeletal ROS (+)   Abdominal (+) - obese,   Peds  Hematology negative hematology ROS (+)   Anesthesia Other Findings   Reproductive/Obstetrics                             Anesthesia Physical Anesthesia Plan  ASA: I  Anesthesia Plan: General   Post-op Pain Management:    Induction: Intravenous  PONV Risk Score and Plan: 1 and Propofol infusion  Airway Management Planned: Natural Airway  Additional Equipment:   Intra-op Plan:   Post-operative Plan:   Informed Consent: I have reviewed the patients History and Physical, chart, labs and discussed the procedure including the risks, benefits and alternatives for the proposed anesthesia with the patient or authorized representative who has indicated his/her understanding and acceptance.     Dental advisory given  Plan Discussed with: CRNA and Anesthesiologist  Anesthesia Plan Comments:        Anesthesia Quick Evaluation

## 2019-05-13 NOTE — ED Notes (Signed)
ED TO INPATIENT HANDOFF REPORT  ED Nurse Name and Phone #:  [place] Name/Age/Gender Zachary Fischer 38 y.o. male Room/Bed: ED04A/ED04A  Code Status   Code Status: Full Code  Home/SNF/Other Home Patient oriented to: self, place, time and situation Is this baseline? Yes   Triage Complete: Triage complete  Chief Complaint fb in throat  Triage Note Pt arrived via [place] with reports of post nasal drip, states over the past 4-5 days, pt states he has a constant tickle in the back of his throat, states when he coughs his gag reflex is initiated and makes him vomit. Pt states cough is dry.  Pt states he feels like a ball of mucus stuck in his throat.   Pt is able to drink fluids and eat.   Allergies No Known Allergies  Level of Care/Admitting Diagnosis ED Disposition    ED Disposition Condition [facility] Area: [place] [[id number]]  Level of Care: Med-Surg [16]  Covid Evaluation: Asymptomatic Screening Protocol (No Symptoms)  Diagnosis: Esophageal obstruction [992426]  Admitting Physician: Mayer Camel [[id number]]  Attending Physician: Mayer Camel [phone]  PT Class (Do Not Modify): Observation [104]  PT Acc Code (Do Not Modify): Observation [10022]       B Medical/Surgery History Past Medical History:  Diagnosis Date  . Allergy    History reviewed. No pertinent surgical history.   A IV Location/Drains/Wounds Patient Lines/Drains/Airways Status   Active Line/Drains/Airways    Name:   Placement date:   Placement time:   Site:   Days:   Peripheral IV 05/12/19 Right Antecubital   05/12/19    2352    Antecubital   1          Intake/Output Last 24 hours No intake or output data in the 24 hours ending 05/13/19 0553  Labs/Imaging Results for orders placed or performed during the hospital encounter of 05/12/19 (from the past 48 hour(s))  CBC with Differential     Status: None   Collection Time: 05/12/19 11:46 PM   Result Value Ref Range   WBC 6.9 4.0 - 10.5 K/uL   RBC 4.79 4.22 - 5.81 MIL/uL   Hemoglobin 14.3 13.0 - 17.0 g/dL   HCT 41.6 39.0 - 52.0 %   MCV 86.8 80.0 - 100.0 fL   MCH 29.9 26.0 - 34.0 pg   MCHC 34.4 30.0 - 36.0 g/dL   RDW 13.0 11.5 - 15.5 %   Platelets 179 150 - 400 K/uL   nRBC 0.0 0.0 - 0.2 %   Neutrophils Relative % 55 %   Neutro Abs 3.8 1.7 - 7.7 K/uL   Lymphocytes Relative 31 %   Lymphs Abs 2.1 0.7 - 4.0 K/uL   Monocytes Relative 11 %   Monocytes Absolute 0.8 0.1 - 1.0 K/uL   Eosinophils Relative 2 %   Eosinophils Absolute 0.1 0.0 - 0.5 K/uL   Basophils Relative 1 %   Basophils Absolute 0.0 0.0 - 0.1 K/uL   Immature Granulocytes 0 %   Abs Immature Granulocytes 0.02 0.00 - 0.07 K/uL    Comment: Performed at [facility], [address] [place], [place] [address]  Comprehensive metabolic panel     Status: None   Collection Time: 05/12/19 11:46 PM  Result Value Ref Range   Sodium 137 135 - 145 mmol/L   Potassium 3.7 3.5 - 5.1 mmol/L   Chloride 100 98 - 111 mmol/L   CO2 27  22 - 32 mmol/L   Glucose, Bld 82 70 - 99 mg/dL   BUN 9 6 - 20 mg/dL   Creatinine, Ser 0.82 0.61 - 1.24 mg/dL   Calcium 9.7 8.9 - 10.3 mg/dL   Total Protein 7.7 6.5 - 8.1 g/dL   Albumin 4.6 3.5 - 5.0 g/dL   AST 29 15 - 41 U/L   ALT 26 0 - 44 U/L   Alkaline Phosphatase 88 38 - 126 U/L   Total Bilirubin 0.8 0.3 - 1.2 mg/dL   GFR calc non Af Amer >60 >60 mL/min   GFR calc Af Amer >60 >60 mL/min   Anion gap 10 5 - 15    Comment: Performed at [facility], [address] [place], [place] [address]  SARS Coronavirus 2 [facility] order, Performed in [facility] hospital lab) Nasopharyngeal Nasopharyngeal Swab     Status: None   Collection Time: 05/13/19  4:16 AM   Specimen: Nasopharyngeal Swab  Result Value Ref Range   SARS Coronavirus 2 NEGATIVE NEGATIVE    Comment: (NOTE) If result is NEGATIVE SARS-CoV-2 target nucleic acids are NOT DETECTED. The SARS-CoV-2 RNA is generally  detectable in upper and lower  respiratory specimens during the acute phase of infection. The lowest  concentration of SARS-CoV-2 viral copies this assay can detect is 250  copies / mL. A negative result does not preclude SARS-CoV-2 infection  and should not be used as the sole basis for treatment or other  patient management decisions.  A negative result may occur with  improper specimen collection / handling, submission of specimen other  than nasopharyngeal swab, presence of viral mutation(s) within the  areas targeted by this assay, and inadequate number of viral copies  (<250 copies / mL). A negative result must be combined with clinical  observations, patient history, and epidemiological information. If result is POSITIVE SARS-CoV-2 target nucleic acids are DETECTED. The SARS-CoV-2 RNA is generally detectable in upper and lower  respiratory specimens dur ing the acute phase of infection.  Positive  results are indicative of active infection with SARS-CoV-2.  Clinical  correlation with patient history and other diagnostic information is  necessary to determine patient infection status.  Positive results do  not rule out bacterial infection or co-infection with other viruses. If result is PRESUMPTIVE POSTIVE SARS-CoV-2 nucleic acids MAY BE PRESENT.   A presumptive positive result was obtained on the submitted specimen  and confirmed on repeat testing.  While 2019 novel coronavirus  (SARS-CoV-2) nucleic acids may be present in the submitted sample  additional confirmatory testing may be necessary for epidemiological  and / or clinical management purposes  to differentiate between  SARS-CoV-2 and other Sarbecovirus currently known to infect humans.  If clinically indicated additional testing with an alternate test  methodology [phone]) is advised. The SARS-CoV-2 RNA is generally  detectable in upper and lower respiratory sp ecimens during the acute  phase of infection. The  expected result is Negative. Fact Sheet for Patients:  StrictlyIdeas.no Fact Sheet for Healthcare Providers: BankingDealers.co.za This test is not yet approved or cleared by the [place] FDA and has been authorized for detection and/or diagnosis of SARS-CoV-2 by FDA under an Emergency Use Authorization (EUA).  This EUA will remain in effect (meaning this test can be used) for the duration of the COVID-19 declaration under Section 564(b)(1) of the Act, 21 U.S.C. section 360bbb-3(b)(1), unless the authorization is terminated or revoked sooner. Performed at [facility], [address] [place],  [place] [address]    Ct Soft Tissue Neck W Contrast  Result Date: 05/13/2019 CLINICAL DATA:  Initial evaluation for acute dysphagia, unexplained. EXAM: CT NECK AND CHEST WITHOUT CONTRAST COMPARISON:  None. FINDINGS: CT NECK FINDINGS Pharynx and larynx: Oral cavity within normal limits without discrete mass or loculated fluid collection. No acute abnormality about the dentition. No base of tongue lesion. [place]at[place] tonsils are hypertrophied and enlarged bilaterally, left slightly greater than right. No discrete tonsillar or peritonsillar abscess. Parapharyngeal fat maintained. Adenoidal soft tissues prominent as well. No retropharyngeal collection. Epiglottis normal. Vallecula clear. Remainder of the hypopharynx and supraglottic larynx within normal limits. Glottis closed and not well assessed, but grossly unremarkable. Subglottic airway clear. Salivary glands: Salivary glands including the parotid and submandibular glands within normal limits. Thyroid: Thyroid normal. Lymph nodes: No pathologically enlarged lymph nodes seen within the neck. Vascular: Normal intravascular enhancement seen throughout the neck. Limited intracranial: Unremarkable. Visualized orbits: Unremarkable. Mastoids and visualized paranasal sinuses: Visualized paranasal sinuses are  largely clear. Visualize mastoids and middle ear cavities are well pneumatized and free of fluid. Skeleton: No acute osseous abnormality. No discrete lytic or blastic osseous lesions. Upper chest: Assessed on CT chest portion of this exam. Other: None. CT CHEST FINDINGS Cardiovascular: Intrathoracic aorta normal in caliber without aneurysm or other acute abnormality. Partially visualized great vessels within normal limits. Heart size normal. No pericardial effusion. Limited assessment of the pulmonary arterial tree grossly unremarkable. Mediastinum/Nodes: No pathologically enlarged mediastinal, hilar, or axillary lymph nodes. Minimal soft tissue density within the anterior mediastinum felt to be consistent with normal residual thymic tissue. Esophagus decompressed without acute abnormality. Lungs/Pleura: Tracheobronchial tree intact and patent. Lungs well inflated bilaterally. No focal infiltrates. No pulmonary edema or pleural effusion. No pneumothorax. No worrisome pulmonary nodule or mass. Subcentimeter nodular density along the anterior right major fissure most consistent with a small intra pulmonic lymph node. Upper Abdomen: Visualized upper chest demonstrates no acute abnormality. Musculoskeletal: No acute osseous abnormality. No discrete lytic or blastic osseous lesions. IMPRESSION: CT NECK IMPRESSION: 1. Bilateral tonsillar and adenoidal hypertrophy. Finding is nonspecific, and could be reactive in nature due to an underlying infectious process. An inflammatory/allergic reaction, changes related to exposure/irritant, or reflux disease could also be considered. No discrete tonsillar or peritonsillar abscess. 2. Otherwise negative neck CT. No other acute inflammatory changes or findings to explain patient's symptoms identified. CT CHEST IMPRESSION: Normal CT of the chest. No acute abnormality or findings to explain patient's symptoms identified. Electronically Signed   By: Jeannine Boga M.D.   On:  05/13/2019 01:17   Ct Chest W Contrast  Result Date: 05/13/2019 CLINICAL DATA:  Initial evaluation for acute dysphagia, unexplained. EXAM: CT NECK AND CHEST WITHOUT CONTRAST COMPARISON:  None. FINDINGS: CT NECK FINDINGS Pharynx and larynx: Oral cavity within normal limits without discrete mass or loculated fluid collection. No acute abnormality about the dentition. No base of tongue lesion. Palatine tonsils are hypertrophied and enlarged bilaterally, left slightly greater than right. No discrete tonsillar or peritonsillar abscess. Parapharyngeal fat maintained. Adenoidal soft tissues prominent as well. No retropharyngeal collection. Epiglottis normal. Vallecula clear. Remainder of the hypopharynx and supraglottic larynx within normal limits. Glottis closed and not well assessed, but grossly unremarkable. Subglottic airway clear. Salivary glands: Salivary glands including the parotid and submandibular glands within normal limits. Thyroid: Thyroid normal. Lymph nodes: No pathologically enlarged lymph nodes seen within the neck. Vascular: Normal intravascular enhancement seen throughout the neck. Limited intracranial: Unremarkable. Visualized orbits: Unremarkable. Mastoids and visualized paranasal  sinuses: Visualized paranasal sinuses are largely clear. Visualize mastoids and middle ear cavities are well pneumatized and free of fluid. Skeleton: No acute osseous abnormality. No discrete lytic or blastic osseous lesions. Upper chest: Assessed on CT chest portion of this exam. Other: None. CT CHEST FINDINGS Cardiovascular: Intrathoracic aorta normal in caliber without aneurysm or other acute abnormality. Partially visualized great vessels within normal limits. Heart size normal. No pericardial effusion. Limited assessment of the pulmonary arterial tree grossly unremarkable. Mediastinum/Nodes: No pathologically enlarged mediastinal, hilar, or axillary lymph nodes. Minimal soft tissue density within the anterior  mediastinum felt to be consistent with normal residual thymic tissue. Esophagus decompressed without acute abnormality. Lungs/Pleura: Tracheobronchial tree intact and patent. Lungs well inflated bilaterally. No focal infiltrates. No pulmonary edema or pleural effusion. No pneumothorax. No worrisome pulmonary nodule or mass. Subcentimeter nodular density along the anterior right major fissure most consistent with a small intra pulmonic lymph node. Upper Abdomen: Visualized upper chest demonstrates no acute abnormality. Musculoskeletal: No acute osseous abnormality. No discrete lytic or blastic osseous lesions. IMPRESSION: CT NECK IMPRESSION: 1. Bilateral tonsillar and adenoidal hypertrophy. Finding is nonspecific, and could be reactive in nature due to an underlying infectious process. An inflammatory/allergic reaction, changes related to exposure/irritant, or reflux disease could also be considered. No discrete tonsillar or peritonsillar abscess. 2. Otherwise negative neck CT. No other acute inflammatory changes or findings to explain patient's symptoms identified. CT CHEST IMPRESSION: Normal CT of the chest. No acute abnormality or findings to explain patient's symptoms identified. Electronically Signed   By: Jeannine Boga M.D.   On: 05/13/2019 01:17    Pending Labs Unresulted Labs (From admission, onward)    Start     Ordered   05/13/19 7494  Basic metabolic panel  Tomorrow morning,   STAT     05/13/19 0427   05/13/19 0500  CBC  Tomorrow morning,   STAT     05/13/19 0427   05/13/19 0500  Protime-INR  Tomorrow morning,   STAT     05/13/19 0427   05/13/19 0428  HIV antibody (Routine Testing)  Once,   STAT     05/13/19 0427   05/13/19 0428  TSH  Once,   STAT     05/13/19 0427          Vitals/Pain Today's Vitals   05/13/19 0130 05/13/19 0430 05/13/19 0500 05/13/19 0530  BP: 123/79 108/83 110/81 100/76  Pulse: 82 (!) 52 66 63  Resp:      Temp:      TempSrc:      SpO2: 100% 98% 98%  97%  Weight:      Height:      PainSc:        Isolation Precautions No active isolations  Medications Medications  sodium chloride flush (NS) 0.9 % injection 3 mL (has no administration in time range)  0.9 %  sodium chloride infusion (has no administration in time range)  ondansetron (ZOFRAN) tablet 4 mg (has no administration in time range)    Or  ondansetron (ZOFRAN) injection 4 mg (has no administration in time range)  LORazepam (ATIVAN) injection 1 mg (has no administration in time range)  pantoprazole (PROTONIX) injection 40 mg (has no administration in time range)  glucagon (human recombinant) (GLUCAGEN) injection 1 mg (1 mg Intravenous Given 05/12/19 2357)  iohexol (OMNIPAQUE) 300 MG/ML solution 75 mL (75 mLs Intravenous Contrast Given 05/13/19 0031)    Mobility walks Low fall risk   Focused Assessments n/a  R Recommendations: See Admitting Provider Note  Report given to:   Additional Notes: n/a

## 2019-05-13 NOTE — H&P (Signed)
Jonathon Bellows, MD [address], [place], [place], [place], [address] [address], [place], [place], [place], [address] Phone: [phone]  Fax: [phone]  Primary Care Physician:  Susy Frizzle, MD   Pre-Procedure History & Physical: HPI:  Zachary Fischer is a 38 y.o. male is here for an endoscopy    Past Medical History:  Diagnosis Date  . Allergy     Past Surgical History:  Procedure Laterality Date  . NECK SURGERY     08/19 lipoma     Prior to Admission medications   Not on File    Allergies as of 05/12/2019  . (No Known Allergies)    Family History  Problem Relation Age of Onset  . Cancer Mother        stage 4 breast cancer  . Diabetes Mother   . Stroke Maternal Grandmother   . Heart disease Maternal Grandmother   . Kidney disease Maternal Grandmother   . Hypertension Maternal Grandmother   . Diabetes Maternal Grandmother   . Cancer Maternal Grandfather        colon  . Hypertension Paternal Grandmother   . Hypertension Paternal Grandfather   . Cancer Paternal Grandfather        colon cancer  . Diabetes Father   . Healthy Brother   . Healthy Sister     Social History   Socioeconomic History  . Marital status: Married    Spouse name: Not on file  . Number of children: Not on file  . Years of education: Not on file  . Highest education level: Not on file  Occupational History  . Not on file  Social Needs  . Financial resource strain: Not on file  . Food insecurity    Worry: Not on file    Inability: Not on file  . Transportation needs    Medical: Not on file    Non-medical: Not on file  Tobacco Use  . Smoking status: Never Smoker  . Smokeless tobacco: Never Used  Substance and Sexual Activity  . Alcohol use: No  . Drug use: No  . Sexual activity: Not on file  Lifestyle  . Physical activity    Days per week: Not on file    Minutes per session: Not on file  . Stress: Not on file  Relationships  . Social Clinical research associate on phone: Not on file    Gets together: Not on file    Attends religious service: Not on file    Active member of club or organization: Not on file    Attends meetings of clubs or organizations: Not on file    Relationship status: Not on file  . Intimate partner violence    Fear of current or ex partner: Not on file    Emotionally abused: Not on file    Physically abused: Not on file    Forced sexual activity: Not on file  Other Topics Concern  . Not on file  Social History Narrative  . Not on file    Review of Systems: See HPI, otherwise negative ROS  Physical Exam: BP (!) 103/59   Pulse (!) 56   Temp (!) 96.9 F (36.1 C) (Tympanic)   Resp 18   Ht 5\' 7"  (1.702 m)   Wt 78.5 kg   SpO2 100%   BMI 27.10 kg/m  General:   Alert,  pleasant and cooperative in NAD Head:  Normocephalic and atraumatic. Neck:  Supple; no  masses or thyromegaly. Lungs:  Clear throughout to auscultation, normal respiratory effort.    Heart:  +S1, +S2, Regular rate and rhythm, No edema. Abdomen:  Soft, nontender and nondistended. Normal bowel sounds, without guarding, and without rebound.   Neurologic:  Alert and  oriented x4;  grossly normal neurologically.  Impression/Plan: Zachary Fischer is here for an endoscopy  to be performed for  evaluation of dysphagia    Risks, benefits, limitations, and alternatives regarding endoscopy have been reviewed with the patient.  Questions have been answered.  All parties agreeable.   Jonathon Bellows, MD  05/13/2019, 12:02 PM

## 2019-05-13 NOTE — ED Notes (Signed)
Pt to the er for feeling of fullness in the back of the throat. Pt states he is unable to eat for the last several days as when he attempts to eat. He will gag and vomit. Pt reports sinus drainage. Pt denies fever. Denies swallowing anything foreign or any food that could have become lodged. Pt is in no acute distress.

## 2019-05-13 NOTE — ED Notes (Signed)
Ativan currently on hold as pt is sleeping soundly and will wait to give prior to pt going to endoscopy.

## 2019-05-13 NOTE — Anesthesia Post-op Follow-up Note (Signed)
Anesthesia QCDR form completed.        

## 2019-05-14 ENCOUNTER — Encounter: Payer: Self-pay | Admitting: Gastroenterology

## 2019-05-14 LAB — SURGICAL PATHOLOGY

## 2019-05-14 LAB — HIV ANTIBODY (ROUTINE TESTING W REFLEX): HIV Screen 4th Generation wRfx: NONREACTIVE

## 2019-05-20 ENCOUNTER — Other Ambulatory Visit: Payer: Self-pay

## 2019-05-21 ENCOUNTER — Ambulatory Visit (INDEPENDENT_AMBULATORY_CARE_PROVIDER_SITE_OTHER): Payer: BC Managed Care – PPO | Admitting: Family Medicine

## 2019-05-21 ENCOUNTER — Encounter: Payer: Self-pay | Admitting: Family Medicine

## 2019-05-21 VITALS — BP 120/80 | HR 74 | Temp 98.6°F | Resp 16 | Ht 67.0 in | Wt 181.0 lb

## 2019-05-21 DIAGNOSIS — G4710 Hypersomnia, unspecified: Secondary | ICD-10-CM | POA: Diagnosis not present

## 2019-05-21 NOTE — Progress Notes (Signed)
Subjective:    Patient ID: Zachary Fischer, male    DOB: [birth year], 38 y.o.   MRN: [id number]  HPI Recently admitted to hospital.  I have copied the DC summary below for my reference:  DATE OF ADMISSION:  05/12/2019     DATE OF DISCHARGE: 05/13/2019   ADMISSION DIAGNOSIS:  Globus sensation [R09.89] Esophageal obstruction [K22.2]  DISCHARGE DIAGNOSIS:  Active Problems:   Esophageal obstruction   SECONDARY DIAGNOSIS:       Past Medical History:  Diagnosis Date  . Allergy     HOSPITAL COURSE:  *38 year old male who presented to the emergency room due to difficulty swallowing. 1.  Dysphagia for solids and liquids: Patient is status post EGD. EGD shows nonbleeding esophageal ulcer which was biopsied.  Normal mucosa was found in the entire esophagus.  He does have evidence of gastritis.  GI has recommended PPI and follow-up in 2 weeks.  He should avoid NSAIDs.  Biopsy revealed: DIAGNOSIS:  A. STOMACH, ANTRUM; COLD BIOPSY:  - GASTRIC ANTRAL AND OXYNTIC MUCOSA WITH MINIMAL CHRONIC INFLAMMATION  AND REACTIVE CHANGES.  - NEGATIVE FOR ACTIVE INFLAMMATION AND H. PYLORI.  - NEGATIVE FOR INTESTINAL METAPLASIA, DYSPLASIA, AND MALIGNANCY.   B. GEJ, ULCER; COLD BIOPSY:  - SQUAMOUS MUCOSA WITH MILD FEATURES SUGGESTIVE OF REFLUX ESOPHAGITIS.  - GASTRIC-TYPE MUCOSA WITH REACTIVE FOVEOLAR HYPERPLASIA AND FOCAL  SURFACE EROSION.  - NEGATIVE FOR INTESTINAL METAPLASIA, DYSPLASIA, AND MALIGNANCY.   C. ESOPHAGUS, RANDOM; COLD BIOPSY:  - UNREMARKABLE SQUAMOUS MUCOSA.  - NEGATIVE FOR INCREASED EOSINOPHILS (< 1 PER HIGH-POWER FIELD).  - NEGATIVE FOR INTESTINAL METAPLASIA, DYSPLASIA, AND MALIGNANCY.   CT scan of the neck revealed:  IMPRESSION: CT NECK IMPRESSION:  1. Bilateral tonsillar and adenoidal hypertrophy. Finding is nonspecific, and could be reactive in nature due to an underlying infectious process. An inflammatory/allergic reaction, changes related to  exposure/irritant, or reflux disease could also be considered. No discrete tonsillar or peritonsillar abscess. 2. Otherwise negative neck CT. No other acute inflammatory changes or findings to explain patient's symptoms identified.   Patient is here today for follow-up.  He states that he feels much better.  Initially, the symptoms began on Sunday.  He became choked while eating.  Afterwards, he was unable to swallow.  As soon as he would eat food, within a few bites he would become nauseated and throw up.  This went on for 3 days.  This prompted him to go the emergency room where he had his EGD which revealed minor esophageal ulcerations and finding consistent with reflux.  After the EGD he denies any further dysphasia.  He is eating his normal diet without any nausea or vomiting or reflux.  I question the patient about sleep apnea given the pronounced tonsils on his CT scan.  He does report hypersomnia.  He states that for the last several months, he feels very tired no matter how much he sleeps.  He will typically sleeps 7 to 8 hours but still feels extremely tired when he wakes up.  He also reports poor concentration and easy fatigability.  A CBC and TSH and CMP were normal in the emergency room.  On physical exam, patient has a very small airway due to the swelling of his tonsils and his uvula coupled with his tongue. Past Medical History:  Diagnosis Date  . Allergy    Past Surgical History:  Procedure Laterality Date  . ESOPHAGOGASTRODUODENOSCOPY (EGD) WITH PROPOFOL N/A 05/13/2019   Procedure: ESOPHAGOGASTRODUODENOSCOPY (EGD) WITH PROPOFOL;  Surgeon:  Jonathon Bellows, MD;  Location: [facility] ENDOSCOPY;  Service: Gastroenterology;  Laterality: N/A;  . NECK SURGERY     08/19 lipoma    Current Outpatient Medications on File Prior to Visit  Medication Sig Dispense Refill  . pantoprazole (PROTONIX) 40 MG tablet Take 1 tablet (40 mg total) by mouth daily. 30 tablet 0   No current facility-administered  medications on file prior to visit.    No Known Allergies Social History   Socioeconomic History  . Marital status: Married    Spouse name: Not on file  . Number of children: Not on file  . Years of education: Not on file  . Highest education level: Not on file  Occupational History  . Not on file  Social Needs  . Financial resource strain: Not on file  . Food insecurity    Worry: Not on file    Inability: Not on file  . Transportation needs    Medical: Not on file    Non-medical: Not on file  Tobacco Use  . Smoking status: Never Smoker  . Smokeless tobacco: Never Used  Substance and Sexual Activity  . Alcohol use: No  . Drug use: No  . Sexual activity: Not on file  Lifestyle  . Physical activity    Days per week: Not on file    Minutes per session: Not on file  . Stress: Not on file  Relationships  . Social Herbalist on phone: Not on file    Gets together: Not on file    Attends religious service: Not on file    Active member of club or organization: Not on file    Attends meetings of clubs or organizations: Not on file    Relationship status: Not on file  . Intimate partner violence    Fear of current or ex partner: Not on file    Emotionally abused: Not on file    Physically abused: Not on file    Forced sexual activity: Not on file  Other Topics Concern  . Not on file  Social History Narrative  . Not on file    Review of Systems  All other systems reviewed and are negative.      Objective:   Physical Exam Vitals signs reviewed.  Constitutional:      General: He is not in acute distress.    Appearance: Normal appearance. He is normal weight. He is not ill-appearing.  HENT:     Nose: Nose normal. No congestion or rhinorrhea.     Mouth/Throat:     Mouth: Mucous membranes are moist.     Pharynx: Oropharynx is clear. No oropharyngeal exudate or posterior oropharyngeal erythema.  Cardiovascular:     Rate and Rhythm: Normal rate and regular  rhythm.  Pulmonary:     Effort: Pulmonary effort is normal. No respiratory distress.     Breath sounds: Normal breath sounds. No stridor. No wheezing, rhonchi or rales.  Abdominal:     General: Abdomen is flat. Bowel sounds are normal.     Palpations: Abdomen is soft.  Neurological:     Mental Status: He is alert.           Assessment & Plan:  Hypersomnia - Plan: Ambulatory referral to Sleep Studies  I have recommended that he continue the Protonix for 6 to 8 weeks.  Then if he is asymptomatic he can discontinue Protonix.  Patient certainly has symptoms concerning for obstructive sleep apnea.  I will refer  the patient for a sleep study.  If his sleep study confirms sleep apnea, he may benefit with evaluation with ENT to discuss having his tonsils and adenoids removed as these seem to be contributing to airway obstruction.

## 2019-05-22 ENCOUNTER — Other Ambulatory Visit: Payer: Self-pay | Admitting: Family Medicine

## 2019-05-22 DIAGNOSIS — G4710 Hypersomnia, unspecified: Secondary | ICD-10-CM

## 2019-05-28 ENCOUNTER — Encounter: Payer: Self-pay | Admitting: Gastroenterology

## 2019-06-03 ENCOUNTER — Ambulatory Visit: Payer: BC Managed Care – PPO | Admitting: Gastroenterology

## 2019-06-03 ENCOUNTER — Encounter: Payer: Self-pay | Admitting: Gastroenterology

## 2019-06-25 ENCOUNTER — Encounter (HOSPITAL_BASED_OUTPATIENT_CLINIC_OR_DEPARTMENT_OTHER): Payer: BC Managed Care – PPO | Admitting: Internal Medicine

## 2019-07-06 ENCOUNTER — Ambulatory Visit (HOSPITAL_BASED_OUTPATIENT_CLINIC_OR_DEPARTMENT_OTHER): Payer: BC Managed Care – PPO | Attending: Family Medicine | Admitting: Internal Medicine

## 2019-07-06 ENCOUNTER — Other Ambulatory Visit: Payer: Self-pay

## 2019-07-06 VITALS — Temp 97.6°F | Ht 67.0 in | Wt 175.0 lb

## 2019-07-06 DIAGNOSIS — G4710 Hypersomnia, unspecified: Secondary | ICD-10-CM | POA: Diagnosis not present

## 2019-07-06 DIAGNOSIS — R0683 Snoring: Secondary | ICD-10-CM | POA: Diagnosis not present

## 2019-07-15 DIAGNOSIS — G4710 Hypersomnia, unspecified: Secondary | ICD-10-CM

## 2019-07-15 NOTE — Procedures (Signed)
   Patient Name: Zachary Fischer, [place] Date: 07/06/2019 Gender: Male D.O.B: 1981-06-07 Age (years): 37 Referring Provider: Cletus Gash T. Pickard Height (inches): 67 Interpreting Physician: Baird Lyons MD, ABSM Weight (lbs): 175 RPSGT: Zadie Rhine BMI: 27 MRN: [id number] Neck Size: 14.50  CLINICAL INFORMATION Sleep Study Type: NPSG Indication for sleep study: Daytime Fatigue, Snoring Epworth Sleepiness Score: 15  SLEEP STUDY TECHNIQUE As per the AASM Manual for the Scoring of Sleep and Associated Events v2.3 (April 2016) with a hypopnea requiring 4% desaturations.  The channels recorded and monitored were frontal, central and occipital EEG, electrooculogram (EOG), submentalis EMG (chin), nasal and oral airflow, thoracic and abdominal wall motion, anterior tibialis EMG, snore microphone, electrocardiogram, and pulse oximetry.  MEDICATIONS Medications self-administered by patient taken the night of the study : [place] The study was initiated at 9:52:54 PM and ended at 4:51:08 AM.  Sleep onset time was 9.4 minutes and the sleep efficiency was 96.4%%. The total sleep time was 403 minutes.  Stage REM latency was 60.5 minutes.  The patient spent 2.1%% of the night in stage N1 sleep, 69.7%% in stage N2 sleep, 2.2%% in stage N3 and 25.9% in REM.  Alpha intrusion was absent.  Supine sleep was 21.70%.  RESPIRATORY PARAMETERS The overall apnea/hypopnea index (AHI) was 3.6 per hour. There were 21 total apneas, including 18 obstructive, 3 central and 0 mixed apneas. There were 3 hypopneas and 18 RERAs.  The AHI during Stage REM sleep was 10.3 per hour.  AHI while supine was 11.7 per hour.  The mean oxygen saturation was 96.7%. The minimum SpO2 during sleep was 82.0%.  moderate snoring was noted during this study.  CARDIAC DATA The 2 lead EKG demonstrated sinus rhythm. The mean heart rate was 57.1 beats per minute. Other EKG findings include: None.  LEG  MOVEMENT DATA The total PLMS were 0 with a resulting PLMS index of 0.0. Associated arousal with leg movement index was 0.0 .  IMPRESSIONS - No significant obstructive sleep apnea occurred during this study (AHI = 3.6/h). - No significant central sleep apnea occurred during this study (CAI = 0.4/h). - Mild oxygen desaturation was noted during this study (Min O2 = 82.0%). Mean sat 96.7%. - The patient snored with moderate snoring volume. - No cardiac abnormalities were noted during this study. - Clinically significant periodic limb movements did not occur during sleep. No significant associated arousals.  DIAGNOSIS - Primary Snoring  RECOMMENDATIONS - Manage for symptoms and snoring, including weight control, sleep postion off back, and management for nasal congesstion if appropriate. - Be careful with alcohol, sedatives and other CNS depressants that may worsen sleep apnea and disrupt normal sleep architecture. - Sleep hygiene should be reviewed to assess factors that may improve sleep quality. - Weight management and regular exercise should be initiated or continued if appropriate.  [Electronically signed] 07/15/2019 02:48 PM  Baird Lyons MD, [place], American Board of Sleep Medicine   NPI: [id number]                          [place] [place] [facility]  ELECTRONICALLY SIGNED ON:  07/15/2019, 2:45 PM [place] PH: [phone]   FX: [phone] [place]

## 2019-08-21 ENCOUNTER — Emergency Department: Payer: BC Managed Care – PPO

## 2019-08-21 ENCOUNTER — Emergency Department
Admission: EM | Admit: 2019-08-21 | Discharge: 2019-08-22 | Disposition: A | Discharge status: Home / Self Care | Payer: BC Managed Care – PPO | Attending: Emergency Medicine | Admitting: Emergency Medicine

## 2019-08-21 ENCOUNTER — Other Ambulatory Visit: Payer: Self-pay

## 2019-08-21 DIAGNOSIS — R0789 Other chest pain: Secondary | ICD-10-CM | POA: Diagnosis not present

## 2019-08-21 DIAGNOSIS — R079 Chest pain, unspecified: Secondary | ICD-10-CM

## 2019-08-21 LAB — CBC
HCT: 44.1 % (ref 39.0–52.0)
Hemoglobin: 15 g/dL (ref 13.0–17.0)
MCH: 29.6 pg (ref 26.0–34.0)
MCHC: 34 g/dL (ref 30.0–36.0)
MCV: 87 fL (ref 80.0–100.0)
Platelets: 225 10*3/uL (ref 150–400)
RBC: 5.07 MIL/uL (ref 4.22–5.81)
RDW: 12.9 % (ref 11.5–15.5)
WBC: 6.3 10*3/uL (ref 4.0–10.5)
nRBC: 0 % (ref 0.0–0.2)

## 2019-08-21 LAB — BASIC METABOLIC PANEL
Anion gap: 10 (ref 5–15)
BUN: 11 mg/dL (ref 6–20)
CO2: 26 mmol/L (ref 22–32)
Calcium: 9.4 mg/dL (ref 8.9–10.3)
Chloride: 102 mmol/L (ref 98–111)
Creatinine, Ser: 0.96 mg/dL (ref 0.61–1.24)
GFR calc Af Amer: >60 mL/min (ref >60)
GFR calc non Af Amer: >60 mL/min (ref >60)
Glucose, Bld: 87 mg/dL (ref 70–99)
Potassium: 3.4 mmol/L — ABNORMAL LOW (ref 3.5–5.1)
Sodium: 138 mmol/L (ref 135–145)

## 2019-08-21 LAB — TROPONIN I (HIGH SENSITIVITY): Troponin I (High Sensitivity): 3 ng/L (ref <18)

## 2019-08-21 MED ORDER — SODIUM CHLORIDE 0.9% FLUSH: 3.0000 mL | Freq: Once | INTRAVENOUS | Status: DC

## 2019-08-21 NOTE — ED Triage Notes (Signed)
Pt presents via POV c/o sharp chest pain, left arm pain, and left hand numbness since 1900 per pt. Denies cardiac hx. Ambulatory to triage.

## 2019-08-22 LAB — TROPONIN I (HIGH SENSITIVITY): Troponin I (High Sensitivity): 2 ng/L (ref <18)

## 2019-08-22 LAB — FIBRIN DERIVATIVES D-DIMER (ARMC ONLY): Fibrin derivatives D-dimer (ARMC): 156.9 ng/mL (FEU) (ref 0.00–499.00)

## 2019-08-22 NOTE — ED Provider Notes (Signed)
[facility] Emergency Department Provider Note _________   First MD Initiated Contact with Patient 08/21/19 2358     (approximate)  I have reviewed the triage vital signs and the nursing notes.   HISTORY  Chief Complaint Chest Pain    HPI Zachary Fischer is a 38 y.o. male with below list of previous medical conditions presents emergency department secondary to acute onset of left chest pain with radiation to the left arm which began at 7:00 PM tonight.  Patient denies any personal history of CAD/MI/PE.  Patient denies any lower extremity pain or swelling.  Patient states that current pain score is 2 out of 10.  Patient denies any dyspnea.        Past Medical History:  Diagnosis Date  . Allergy     Patient Active Problem List   Diagnosis Date Noted  . Esophageal obstruction 05/13/2019  . Routine general medical examination at a health care facility 05/29/2013    Past Surgical History:  Procedure Laterality Date  . ESOPHAGOGASTRODUODENOSCOPY (EGD) WITH PROPOFOL N/A 05/13/2019   Procedure: ESOPHAGOGASTRODUODENOSCOPY (EGD) WITH PROPOFOL;  Surgeon: Jonathon Bellows, MD;  Location: [facility] ENDOSCOPY;  Service: Gastroenterology;  Laterality: N/A;  . NECK SURGERY     08/19 lipoma     Prior to Admission medications   Medication Sig Start Date End Date Taking? Authorizing Provider  pantoprazole (PROTONIX) 40 MG tablet Take 1 tablet (40 mg total) by mouth daily. 05/13/19   Bettey Costa, MD    Allergies Patient has no known allergies.  Family History  Problem Relation Age of Onset  . Cancer Mother        stage 4 breast cancer  . Diabetes Mother   . Stroke Maternal Grandmother   . Heart disease Maternal Grandmother   . Kidney disease Maternal Grandmother   . Hypertension Maternal Grandmother   . Diabetes Maternal Grandmother   . Cancer Maternal Grandfather        colon  . Hypertension Paternal Grandmother   . Hypertension Paternal Grandfather   .  Cancer Paternal Grandfather        colon cancer  . Diabetes Father   . Healthy Brother   . Healthy Sister     Social History Social History   Tobacco Use  . Smoking status: Never Smoker  . Smokeless tobacco: Never Used  Substance Use Topics  . Alcohol use: No  . Drug use: No    Review of Systems Constitutional: No fever/chills Eyes: No visual changes. ENT: No sore throat. Cardiovascular: Positive for chest pain. Respiratory: Denies shortness of breath. Gastrointestinal: No abdominal pain.  No nausea, no vomiting.  No diarrhea.  No constipation. Genitourinary: Negative for dysuria. Musculoskeletal: Negative for neck pain.  Negative for back pain. Integumentary: Negative for rash. Neurological: Negative for headaches, focal weakness or numbness.   ____________________________________________   PHYSICAL EXAM:  VITAL SIGNS: ED Triage Vitals  Enc Vitals Group     BP 08/21/19 2123 139/85     Pulse Rate 08/21/19 2123 74     Resp 08/21/19 2123 14     Temp 08/21/19 2123 98.4 F (36.9 C)     Temp src --      SpO2 08/21/19 2123 96 %     Weight 08/21/19 2124 80.7 kg (178 lb)     Height 08/21/19 2124 1.727 m (5\' 8" )     Head Circumference --      Peak Flow --      Pain  Score 08/21/19 2124 10     Pain Loc --      Pain Edu? --      Excl. in [place]? --     Constitutional: Alert and oriented.  Eyes: Conjunctivae are normal.  Mouth/Throat: Patient is wearing a mask. Neck: No stridor.  No meningeal signs.   Cardiovascular: Normal rate, regular rhythm. Good peripheral circulation. Grossly normal heart sounds. Respiratory: Normal respiratory effort.  No retractions. Gastrointestinal: Soft and nontender. No distention.  Musculoskeletal: No lower extremity tenderness nor edema. No gross deformities of extremities. Neurologic:  Normal speech and language. No gross focal neurologic deficits are appreciated.  Skin:  Skin is warm, dry and intact. Psychiatric: Mood and affect are  normal. Speech and behavior are normal.  ____________________________________________   LABS (all labs ordered are listed, but only abnormal results are displayed)  Labs Reviewed  BASIC METABOLIC PANEL - Abnormal; Notable for the following components:      Result Value   Potassium 3.4 (*)    All other components within normal limits  CBC  FIBRIN DERIVATIVES D-DIMER ([facility] ONLY)  TROPONIN I (HIGH SENSITIVITY)  TROPONIN I (HIGH SENSITIVITY)   ____________________________________________  EKG  ED ECG REPORT I, Mercedes N Jelina Paulsen, the attending physician, personally viewed and interpreted this ECG.   Date: 08/22/2019  EKG Time: 9:05 PM  Rate: 77  Rhythm: Normal sinus rhythm  Axis: Normal  Intervals: Normal  ST&T Change: None  ____________________________________________  RADIOLOGY I, Hiouchi N Geary Rufo, personally viewed and evaluated these images (plain radiographs) as part of my medical decision making, as well as reviewing the written report by the radiologist.  ED MD interpretation: No active cardiopulmonary disease noted on chest x-ray per radiologist.  Official radiology report(s): Dg Chest 2 View  Result Date: 08/21/2019 CLINICAL DATA:  Chest pain. EXAM: CHEST - 2 VIEW COMPARISON:  June 09, 2018 FINDINGS: The heart size and mediastinal contours are within normal limits. Both lungs are clear. The visualized skeletal structures are unremarkable. IMPRESSION: No active cardiopulmonary disease. Electronically Signed   By: Dorise Bullion III M.D   On: 08/21/2019 21:36      Procedures   ____________________________________________   INITIAL IMPRESSION / MDM / [place] / ED COURSE  As part of my medical decision making, I reviewed the following data within the electronic MEDICAL RECORD NUMBER  38 year old male presenting with above-stated history and physical exam secondary to left-sided chest pain.  Considered possibly CAD/MI/PE.  EKG revealed no evidence  of ischemia or infarction laboratory data including high-sensitivity troponin negative x2.  Patient low risk for pulmonary emboli and a such D-dimer was employed which was negative.  Patient will be referred to cardiology for further outpatient evaluation. ____________________________________________  FINAL CLINICAL IMPRESSION(S) / ED DIAGNOSES  Final diagnoses:  Nonspecific chest pain     MEDICATIONS GIVEN DURING THIS VISIT:  Medications  sodium chloride flush (NS) 0.9 % injection 3 mL (has no administration in time range)     ED Discharge Orders    None      *Please note:  FRANCISO DINGUS was evaluated in Emergency Department on 08/22/2019 for the symptoms described in the history of present illness. He was evaluated in the context of the global COVID-19 pandemic, which necessitated consideration that the patient might be at risk for infection with the SARS-CoV-2 virus that causes COVID-19. Institutional protocols and algorithms that pertain to the evaluation of patients at risk for COVID-19 are in a state of rapid change based  on information released by regulatory bodies including the CDC and federal and state organizations. These policies and algorithms were followed during the patient's care in the ED.  Some ED evaluations and interventions may be delayed as a result of limited staffing during the pandemic.*  Note:  This document was prepared using Dragon voice recognition software and may include unintentional dictation errors.   Gregor Hams, MD 08/22/19 [phone]

## 2019-10-02 ENCOUNTER — Encounter: Payer: Self-pay | Admitting: Family Medicine

## 2019-10-29 ENCOUNTER — Encounter: Payer: Self-pay | Admitting: Family Medicine

## 2020-01-19 ENCOUNTER — Emergency Department: Payer: BC Managed Care – PPO

## 2020-01-19 ENCOUNTER — Other Ambulatory Visit: Payer: Self-pay

## 2020-01-19 ENCOUNTER — Emergency Department
Admission: EM | Admit: 2020-01-19 | Discharge: 2020-01-19 | Disposition: A | Discharge status: Home / Self Care | Payer: BC Managed Care – PPO | Attending: Student in an Organized Health Care Education/Training Program | Admitting: Student in an Organized Health Care Education/Training Program

## 2020-01-19 DIAGNOSIS — R509 Fever, unspecified: Secondary | ICD-10-CM | POA: Diagnosis present

## 2020-01-19 DIAGNOSIS — J111 Influenza due to unidentified influenza virus with other respiratory manifestations: Secondary | ICD-10-CM

## 2020-01-19 DIAGNOSIS — U071 COVID-19: Secondary | ICD-10-CM

## 2020-01-19 DIAGNOSIS — Z79899 Other long term (current) drug therapy: Secondary | ICD-10-CM | POA: Diagnosis not present

## 2020-01-19 LAB — CBC
HCT: 38.7 % — ABNORMAL LOW (ref 39.0–52.0)
Hemoglobin: 13.1 g/dL (ref 13.0–17.0)
MCH: 29.8 pg (ref 26.0–34.0)
MCHC: 33.9 g/dL (ref 30.0–36.0)
MCV: 88.2 fL (ref 80.0–100.0)
Platelets: 159 10*3/uL (ref 150–400)
RBC: 4.39 MIL/uL (ref 4.22–5.81)
RDW: 12.8 % (ref 11.5–15.5)
WBC: 5.2 10*3/uL (ref 4.0–10.5)
nRBC: 0 % (ref 0.0–0.2)

## 2020-01-19 LAB — BASIC METABOLIC PANEL
Anion gap: 4 — ABNORMAL LOW (ref 5–15)
BUN: 8 mg/dL (ref 6–20)
CO2: 26 mmol/L (ref 22–32)
Calcium: 8.2 mg/dL — ABNORMAL LOW (ref 8.9–10.3)
Chloride: 106 mmol/L (ref 98–111)
Creatinine, Ser: 0.79 mg/dL (ref 0.61–1.24)
GFR calc Af Amer: >60 mL/min (ref >60)
GFR calc non Af Amer: >60 mL/min (ref >60)
Glucose, Bld: 121 mg/dL — ABNORMAL HIGH (ref 70–99)
Potassium: 3.5 mmol/L (ref 3.5–5.1)
Sodium: 136 mmol/L (ref 135–145)

## 2020-01-19 MED ORDER — IBUPROFEN 600 MG PO TABS
600.0000 mg | ORAL_TABLET | Freq: Once | ORAL | Status: AC
  Administered 2020-01-19: 19:00:00 600 mg via ORAL
  Filled 2020-01-19: qty 1

## 2020-01-19 MED ORDER — PROMETHAZINE HCL 12.5 MG PO TABS: 12.5000 mg | ORAL_TABLET | Freq: Four times a day (QID) | ORAL | 0 refills | Status: AC | PRN

## 2020-01-19 MED ORDER — ALBUTEROL SULFATE HFA 108 (90 BASE) MCG/ACT IN AERS
2.0000 | INHALATION_SPRAY | Freq: Once | RESPIRATORY_TRACT | Status: AC
  Administered 2020-01-19: 2 via RESPIRATORY_TRACT
  Filled 2020-01-19: qty 6.7

## 2020-01-19 MED ORDER — OXYCODONE HCL 5 MG PO TABS
5.0000 mg | ORAL_TABLET | ORAL | Status: DC | PRN
  Administered 2020-01-19: 21:00:00 5 mg via ORAL
  Filled 2020-01-19: qty 1

## 2020-01-19 MED ORDER — PROCHLORPERAZINE MALEATE 10 MG PO TABS
10.0000 mg | ORAL_TABLET | Freq: Once | ORAL | Status: AC
  Administered 2020-01-19: 10 mg via ORAL
  Filled 2020-01-19: qty 1

## 2020-01-19 MED ORDER — TRAMADOL HCL 50 MG PO TABS: 50.0000 mg | ORAL_TABLET | Freq: Four times a day (QID) | ORAL | 0 refills | Status: AC | PRN

## 2020-01-19 NOTE — ED Provider Notes (Signed)
[facility] Emergency Department Provider Note    First MD Initiated Contact with Patient 01/19/20 1930     (approximate)  I have reviewed the triage vital signs and the nursing notes.   HISTORY  Chief Complaint Fever    HPI Zachary Fischer is a 39 y.o. male presents the ER for 24 hours of fever chills headache generalized malaise and cough.  Patient was seen at [facility] past 24 hours for similar symptoms.  Had Covid testing as an outpatient which resulted this morning is positive.  Patient was unaware of these results came to the ER due to persistent symptoms.  He is still has taste.  Is previously healthy. Medications.    Past Medical History:  Diagnosis Date  . Allergy    Family History  Problem Relation Age of Onset  . Cancer Mother        stage 4 breast cancer  . Diabetes Mother   . Stroke Maternal Grandmother   . Heart disease Maternal Grandmother   . Kidney disease Maternal Grandmother   . Hypertension Maternal Grandmother   . Diabetes Maternal Grandmother   . Cancer Maternal Grandfather        colon  . Hypertension Paternal Grandmother   . Hypertension Paternal Grandfather   . Cancer Paternal Grandfather        colon cancer  . Diabetes Father   . Healthy Brother   . Healthy Sister    Past Surgical History:  Procedure Laterality Date  . ESOPHAGOGASTRODUODENOSCOPY (EGD) WITH PROPOFOL N/A 05/13/2019   Procedure: ESOPHAGOGASTRODUODENOSCOPY (EGD) WITH PROPOFOL;  Surgeon: Jonathon Bellows, MD;  [facility];  Service: Gastroenterology;  Laterality: N/A;  . NECK SURGERY     08/19 lipoma    Patient Active Problem List   Diagnosis Date Noted  . Esophageal obstruction 05/13/2019  . Routine general medical examination at a health care facility 05/29/2013      Prior to Admission medications   Medication Sig Start Date End Date Taking? Authorizing Provider  pantoprazole (PROTONIX) 40 MG tablet Take 1 tablet (40 mg total) by  mouth daily. 05/13/19   Bettey Costa, MD  promethazine (PHENERGAN) 12.5 MG tablet Take 1 tablet (12.5 mg total) by mouth every 6 (six) hours as needed. 01/19/20   Merlyn Lot, MD  traMADol (ULTRAM) 50 MG tablet Take 1 tablet (50 mg total) by mouth every 6 (six) hours as needed. 01/19/20 01/18/21  Merlyn Lot, MD    Allergies Patient has no known allergies.    Social History Social History   Tobacco Use  . Smoking status: Never Smoker  . Smokeless tobacco: Never Used  Substance Use Topics  . Alcohol use: No  . Drug use: No    Review of Systems Patient denies headaches, rhinorrhea, blurry vision, numbness, shortness of breath, chest pain, edema, cough, abdominal pain, nausea, vomiting, diarrhea, dysuria, fevers, rashes or hallucinations unless otherwise stated above in HPI. ____________________________________________   PHYSICAL EXAM:  VITAL SIGNS: Vitals:   01/19/20 2111 01/19/20 2129  BP:  125/76  Pulse:  74  Resp:  16  Temp: 99.1 F (37.3 C) 98.9 F (37.2 C)  SpO2:  95%    Constitutional: Alert and oriented.  Eyes: Conjunctivae are normal.  Head: Atraumatic. Nose: No congestion/rhinnorhea. Mouth/Throat: Mucous membranes are moist.   Neck: No stridor. Painless ROM.  Cardiovascular: Normal rate, regular rhythm. Grossly normal heart sounds.  Good peripheral circulation. Respiratory: Normal respiratory effort.  No retractions. Lungs CTAB. Gastrointestinal:  Soft and nontender. No distention. No abdominal bruits. No CVA tenderness. Genitourinary:  Musculoskeletal: No lower extremity tenderness nor edema.  No joint effusions. Neurologic:  Normal speech and language. No gross focal neurologic deficits are appreciated. No facial droop Skin:  Skin is warm, dry and intact. No rash noted. Psychiatric: Mood and affect are normal. Speech and behavior are normal.  ____________________________________________   LABS (all labs ordered are listed, but only abnormal  results are displayed)  Results for orders placed or performed during the hospital encounter of 01/19/20 (from the past 24 hour(s))  CBC     Status: Abnormal   Collection Time: 01/19/20  6:45 PM  Result Value Ref Range   WBC 5.2 4.0 - 10.5 K/uL   RBC 4.39 4.22 - 5.81 MIL/uL   Hemoglobin 13.1 13.0 - 17.0 g/dL   HCT 38.7 (L) 39.0 - 52.0 %   MCV 88.2 80.0 - 100.0 fL   MCH 29.8 26.0 - 34.0 pg   MCHC 33.9 30.0 - 36.0 g/dL   RDW 12.8 11.5 - 15.5 %   Platelets 159 150 - 400 K/uL   nRBC 0.0 0.0 - 0.2 %  Basic metabolic panel     Status: Abnormal   Collection Time: 01/19/20  6:45 PM  Result Value Ref Range   Sodium 136 135 - 145 mmol/L   Potassium 3.5 3.5 - 5.1 mmol/L   Chloride 106 98 - 111 mmol/L   CO2 26 22 - 32 mmol/L   Glucose, Bld 121 (H) 70 - 99 mg/dL   BUN 8 6 - 20 mg/dL   Creatinine, Ser 0.79 0.61 - 1.24 mg/dL   Calcium 8.2 (L) 8.9 - 10.3 mg/dL   GFR calc non Af Amer >60 >60 mL/min   GFR calc Af Amer >60 >60 mL/min   Anion gap 4 (L) 5 - 15   ____________________________________________ ____________________________________________  RADIOLOGY  I personally reviewed all radiographic images ordered to evaluate for the above acute complaints and reviewed radiology reports and findings.  These findings were personally discussed with the patient.  Please see medical record for radiology report.  ____________________________________________   PROCEDURES  Procedure(s) performed:  Procedures    Critical Care performed: no ____________________________________________   INITIAL IMPRESSION / ASSESSMENT AND PLAN / ED COURSE  Pertinent labs & imaging results that were available during my care of the patient were reviewed by me and considered in my medical decision making (see chart for details).   DDX: COVID-19, URI, electrolyte abnormality, dehydration   Zachary Fischer is a 39 y.o. who presents to the ED with symptoms as described above.  Patient nontoxic-appearing he is  febrile.  Has outpatient testing showing evidence of Covid infection.  He is x-ray does not show any evidence of infiltrates.  Electrolytes are normal.  Will give symptomatic management.   Patient reassessed.  Remains nontoxic and stable appearing.    His fever has defervesced.  No hypoxia.  We discussed signs and symptoms for which she should return to the ER.  We discussed symptomatic management at home.  Have discussed with the patient and available family all diagnostics and treatments performed thus far and all questions were answered to the best of my ability. The patient demonstrates understanding and agreement with plan.   The patient was evaluated in Emergency Department today for the symptoms described in the history of present illness. He/she was evaluated in the context of the global COVID-19 pandemic, which necessitated consideration that the patient might be at  risk for infection with the SARS-CoV-2 virus that causes COVID-19. Institutional protocols and algorithms that pertain to the evaluation of patients at risk for COVID-19 are in a state of rapid change based on information released by regulatory bodies including the CDC and federal and state organizations. These policies and algorithms were followed during the patient's care in the ED.  As part of my medical decision making, I reviewed the following data within the [place] notes reviewed and incorporated, Labs reviewed, notes from prior ED visits and [place] Controlled Substance Database   ____________________________________________   FINAL CLINICAL IMPRESSION(S) / ED DIAGNOSES  Final diagnoses:  COVID-19 virus infection  Influenza-like illness      NEW MEDICATIONS STARTED DURING THIS VISIT:  New Prescriptions   PROMETHAZINE (PHENERGAN) 12.5 MG TABLET    Take 1 tablet (12.5 mg total) by mouth every 6 (six) hours as needed.   TRAMADOL (ULTRAM) 50 MG TABLET    Take 1 tablet (50 mg total) by mouth  every 6 (six) hours as needed.     Note:  This document was prepared using Dragon voice recognition software and may include unintentional dictation errors.    Merlyn Lot, MD 01/19/20 2136

## 2020-01-19 NOTE — ED Notes (Signed)
Pt ambulated to the bathroom and tolerated well. NAD noted.

## 2020-01-19 NOTE — ED Notes (Signed)
Pt states that he has a headache, fevers, chest pain and just feels very weak. Pt a&o, no acute distress noted.

## 2020-01-19 NOTE — ED Triage Notes (Addendum)
Pt comes via [facility] with c/o chills, body aches and fever. Pt seen at [facility] and tested for COVID. Results not back  EMS reports temp 100.4, VSS, 18 g LAC, 500 bolus going  Pt states he started feeling bad this am. Pt states he has not been able to get his fever to break. Pt states he has taken medication with no relief. Pt states generalized aches all over.

## 2020-01-20 ENCOUNTER — Emergency Department
Admission: EM | Admit: 2020-01-20 | Discharge: 2020-01-20 | Disposition: A | Discharge status: Home / Self Care | Payer: BC Managed Care – PPO | Attending: Emergency Medicine | Admitting: Emergency Medicine

## 2020-01-20 DIAGNOSIS — R112 Nausea with vomiting, unspecified: Secondary | ICD-10-CM | POA: Diagnosis not present

## 2020-01-20 DIAGNOSIS — U071 COVID-19: Secondary | ICD-10-CM | POA: Diagnosis present

## 2020-01-20 MED ORDER — SODIUM CHLORIDE 0.9 % IV BOLUS
1000.0000 mL | Freq: Once | INTRAVENOUS | Status: AC
  Administered 2020-01-20: 1000 mL via INTRAVENOUS

## 2020-01-20 MED ORDER — ONDANSETRON 4 MG PO TBDP: 4.0000 mg | ORAL_TABLET | Freq: Three times a day (TID) | ORAL | 0 refills | Status: AC | PRN

## 2020-01-20 MED ORDER — ONDANSETRON 4 MG PO TBDP: 4.0000 mg | ORAL_TABLET | Freq: Once | ORAL | Status: AC

## 2020-01-20 MED ORDER — ONDANSETRON 4 MG PO TBDP
ORAL_TABLET | ORAL | Status: AC
  Administered 2020-01-20: 06:00:00 4 mg via ORAL
  Filled 2020-01-20: qty 1

## 2020-01-20 MED ORDER — ACETAMINOPHEN 325 MG PO TABS
650.0000 mg | ORAL_TABLET | Freq: Once | ORAL | Status: AC
  Administered 2020-01-20: 08:00:00 650 mg via ORAL
  Filled 2020-01-20: qty 2

## 2020-01-20 NOTE — ED Notes (Signed)
Pt given water for a few sips of PO challenge.

## 2020-01-20 NOTE — ED Triage Notes (Signed)
Pt seen yesterday in facility and dx with COVID. Pt discharged and reports since having gone home he has been vomiting every time he attempts to drink something. (pt estimates 5-6 times since arriving home).  EMS vitals:  113/65 70 98 % RA

## 2020-01-20 NOTE — ED Provider Notes (Signed)
[facility] Emergency Department Provider Note   ____________________________________________    I have reviewed the triage vital signs and the nursing notes.   HISTORY  Chief Complaint Emesis     HPI Zachary Fischer is a 39 y.o. male who presents with nausea and vomiting.  Patient was diagnosed with COVID-19 yesterday in our emergency department.  At that time he had body aches and fever primarily.  Developed significant nausea and vomiting overnight.  He reports he was trying to hydrate and "stay ahead of it ".  Did have abdominal pain which is resolved now.  No significant diarrhea.  No sick contacts.  Has not take anything for this.  Past Medical History:  Diagnosis Date  . Allergy     Patient Active Problem List   Diagnosis Date Noted  . Esophageal obstruction 05/13/2019  . Routine general medical examination at a health care facility 05/29/2013    Past Surgical History:  Procedure Laterality Date  . ESOPHAGOGASTRODUODENOSCOPY (EGD) WITH PROPOFOL N/A 05/13/2019   Procedure: ESOPHAGOGASTRODUODENOSCOPY (EGD) WITH PROPOFOL;  Surgeon: Jonathon Bellows, MD;  Location: [facility] ENDOSCOPY;  Service: Gastroenterology;  Laterality: N/A;  . NECK SURGERY     08/19 lipoma     Prior to Admission medications   Medication Sig Start Date End Date Taking? Authorizing Provider  ondansetron (ZOFRAN ODT) 4 MG disintegrating tablet Take 1 tablet (4 mg total) by mouth every 8 (eight) hours as needed. 01/20/20   Lavonia Drafts, MD  pantoprazole (PROTONIX) 40 MG tablet Take 1 tablet (40 mg total) by mouth daily. 05/13/19   Bettey Costa, MD  promethazine (PHENERGAN) 12.5 MG tablet Take 1 tablet (12.5 mg total) by mouth every 6 (six) hours as needed. 01/19/20   Merlyn Lot, MD  traMADol (ULTRAM) 50 MG tablet Take 1 tablet (50 mg total) by mouth every 6 (six) hours as needed. 01/19/20 01/18/21  Merlyn Lot, MD     Allergies Patient has no known allergies.  Family  History  Problem Relation Age of Onset  . Cancer Mother        stage 4 breast cancer  . Diabetes Mother   . Stroke Maternal Grandmother   . Heart disease Maternal Grandmother   . Kidney disease Maternal Grandmother   . Hypertension Maternal Grandmother   . Diabetes Maternal Grandmother   . Cancer Maternal Grandfather        colon  . Hypertension Paternal Grandmother   . Hypertension Paternal Grandfather   . Cancer Paternal Grandfather        colon cancer  . Diabetes Father   . Healthy Brother   . Healthy Sister     Social History Social History   Tobacco Use  . Smoking status: Never Smoker  . Smokeless tobacco: Never Used  Substance Use Topics  . Alcohol use: No  . Drug use: No    Review of Systems  Constitutional: Positive fever Eyes: No visual changes.  ENT: No sore throat. Cardiovascular: Denies chest pain. Respiratory: Denies shortness of breath. Gastrointestinal: As above Genitourinary: Normal urination Musculoskeletal: Body aches Skin: Negative for rash. Neurological: Positive headaches   ____________________________________________   PHYSICAL EXAM:  VITAL SIGNS: ED Triage Vitals  Enc Vitals Group     BP 01/20/20 0550 105/61     Pulse Rate 01/20/20 0550 97     Resp 01/20/20 0550 16     Temp 01/20/20 0550 (!) 102.3 F (39.1 C)     Temp Source 01/20/20 0550 Oral  SpO2 01/20/20 0550 100 %     Weight 01/20/20 0552 79.4 kg (175 lb 0.7 oz)     Height 01/20/20 0552 1.702 m (5\' 7" )     Head Circumference --      Peak Flow --      Pain Score 01/20/20 0552 10     Pain Loc --      Pain Edu? --      Excl. in [place]? --     Constitutional: Alert and oriented.   Nose: No congestion/rhinnorhea. Mouth/Throat: Mucous membranes are moist.    Cardiovascular: Normal rate, regular rhythm. Grossly normal heart sounds.  Good peripheral circulation. Respiratory: Normal respiratory effort.  No retractions. Lungs CTAB. Gastrointestinal: Soft and nontender. No  distention.  No CVA tenderness.  Musculoskeletal: Warm and well perfused Neurologic:  Normal speech and language. No gross focal neurologic deficits are appreciated.  Skin:  Skin is warm, dry and intact. No rash noted. Psychiatric: Mood and affect are normal. Speech and behavior are normal.  ____________________________________________   LABS (all labs ordered are listed, but only abnormal results are displayed)  Labs Reviewed - No data to display ____________________________________________  EKG  None ____________________________________________  RADIOLOGY  Chest x-ray reviewed by me and is unremarkable, no pneumonia ____________________________________________   PROCEDURES  Procedure(s) performed: No  Procedures   Critical Care performed: No ____________________________________________   INITIAL IMPRESSION / ASSESSMENT AND PLAN / ED COURSE  Pertinent labs & imaging results that were available during my care of the patient were reviewed by me and considered in my medical decision making (see chart for details).  Patient with known recent diagnosis of COVID-19 presents with fever, nausea and vomiting.  Symptoms are almost certainly related to COVID-19 virus.  Symptomatic care provided here with IV fluids and IV Zofran which helped significantly.  Lab work demonstrates normal white blood cell count.  Normal BUN to creatinine ratio.  Chest x-ray does not demonstrate any evidence of pneumonia and the patient has no shortness of breath.  Discussed with patient symptomatic treatment, will Rx Zofran    ____________________________________________   FINAL CLINICAL IMPRESSION(S) / ED DIAGNOSES  Final diagnoses:  COVID-19  Nausea and vomiting, intractability of vomiting not specified, unspecified vomiting type        Note:  This document was prepared using Dragon voice recognition software and may include unintentional dictation errors.   Lavonia Drafts,  MD 01/20/20 [phone]

## 2020-01-23 ENCOUNTER — Encounter: Payer: Self-pay | Admitting: Emergency Medicine

## 2020-01-23 ENCOUNTER — Other Ambulatory Visit: Payer: Self-pay

## 2020-01-23 DIAGNOSIS — U071 COVID-19: Secondary | ICD-10-CM | POA: Insufficient documentation

## 2020-01-23 DIAGNOSIS — R531 Weakness: Secondary | ICD-10-CM | POA: Diagnosis not present

## 2020-01-23 DIAGNOSIS — R55 Syncope and collapse: Secondary | ICD-10-CM | POA: Diagnosis not present

## 2020-01-23 LAB — BASIC METABOLIC PANEL
Anion gap: 8 (ref 5–15)
BUN: 17 mg/dL (ref 6–20)
CO2: 29 mmol/L (ref 22–32)
Calcium: 8.7 mg/dL — ABNORMAL LOW (ref 8.9–10.3)
Chloride: 99 mmol/L (ref 98–111)
Creatinine, Ser: 1.06 mg/dL (ref 0.61–1.24)
GFR calc Af Amer: >60 mL/min (ref >60)
GFR calc non Af Amer: >60 mL/min (ref >60)
Glucose, Bld: 90 mg/dL (ref 70–99)
Potassium: 3.5 mmol/L (ref 3.5–5.1)
Sodium: 136 mmol/L (ref 135–145)

## 2020-01-23 LAB — CBC
HCT: 44.1 % (ref 39.0–52.0)
Hemoglobin: 15 g/dL (ref 13.0–17.0)
MCH: 29.5 pg (ref 26.0–34.0)
MCHC: 34 g/dL (ref 30.0–36.0)
MCV: 86.6 fL (ref 80.0–100.0)
Platelets: 148 10*3/uL — ABNORMAL LOW (ref 150–400)
RBC: 5.09 MIL/uL (ref 4.22–5.81)
RDW: 12.4 % (ref 11.5–15.5)
WBC: 2.9 10*3/uL — ABNORMAL LOW (ref 4.0–10.5)
nRBC: 0 % (ref 0.0–0.2)

## 2020-01-23 NOTE — ED Notes (Signed)
Patient to waiting room via wheelchair by EMS. Patient diagnosed with COVID on 4/27 and reports continued weakness. EMS vitals - hr 78, bp 122/65, pulse oxi 97% on room air, temp 99.7 (oral), cbg 97.

## 2020-01-23 NOTE — ED Triage Notes (Signed)
Pt arrives via [facility] with c/o "feeling like I was going to pass out right before I called" EMS. Pt states that at this time he is able to keep down fluids. Pt is in NAD.

## 2020-01-24 ENCOUNTER — Emergency Department
Admission: EM | Admit: 2020-01-24 | Discharge: 2020-01-24 | Disposition: A | Discharge status: Home / Self Care | Payer: HRSA Program | Attending: Emergency Medicine | Admitting: Emergency Medicine

## 2020-01-24 DIAGNOSIS — U071 COVID-19: Secondary | ICD-10-CM

## 2020-01-24 DIAGNOSIS — R55 Syncope and collapse: Secondary | ICD-10-CM

## 2020-01-24 MED ORDER — ACETAMINOPHEN 500 MG PO TABS
ORAL_TABLET | ORAL | Status: AC
  Filled 2020-01-24: qty 2

## 2020-01-24 MED ORDER — ACETAMINOPHEN 500 MG PO TABS
1000.0000 mg | ORAL_TABLET | Freq: Once | ORAL | Status: AC
  Administered 2020-01-24: 1000 mg via ORAL

## 2020-01-24 NOTE — ED Provider Notes (Signed)
[facility] Emergency Department Provider Note  ____________________________________________   I have reviewed the triage vital signs and the nursing notes.   HISTORY  Chief Complaint Near Syncope   History limited by: Not Limited   HPI Zachary Fischer is a 39 y.o. male who presents to the emergency department today because of feeling of near syncope.  The patient was diagnosed with Covid roughly 5 days ago.  The patient states that he came in tonight because he had a feeling like he might pass out.  He states he has been weak since his diagnosis.  He has been trying to eat and drink as much as he can.  He has had a cough.   Records reviewed. Per medical record review patient has a history of recent diagnosis of covid.  Past Medical History:  Diagnosis Date  . Allergy     Patient Active Problem List   Diagnosis Date Noted  . Esophageal obstruction 05/13/2019  . Routine general medical examination at a health care facility 05/29/2013    Past Surgical History:  Procedure Laterality Date  . ESOPHAGOGASTRODUODENOSCOPY (EGD) WITH PROPOFOL N/A 05/13/2019   Procedure: ESOPHAGOGASTRODUODENOSCOPY (EGD) WITH PROPOFOL;  Surgeon: Jonathon Bellows, MD;  Location: [facility] ENDOSCOPY;  Service: Gastroenterology;  Laterality: N/A;  . NECK SURGERY     08/19 lipoma     Prior to Admission medications   Medication Sig Start Date End Date Taking? Authorizing Provider  ondansetron (ZOFRAN ODT) 4 MG disintegrating tablet Take 1 tablet (4 mg total) by mouth every 8 (eight) hours as needed. 01/20/20   Lavonia Drafts, MD  pantoprazole (PROTONIX) 40 MG tablet Take 1 tablet (40 mg total) by mouth daily. 05/13/19   Bettey Costa, MD  promethazine (PHENERGAN) 12.5 MG tablet Take 1 tablet (12.5 mg total) by mouth every 6 (six) hours as needed. 01/19/20   Merlyn Lot, MD  traMADol (ULTRAM) 50 MG tablet Take 1 tablet (50 mg total) by mouth every 6 (six) hours as needed. 01/19/20 01/18/21   Merlyn Lot, MD    Allergies Patient has no known allergies.  Family History  Problem Relation Age of Onset  . Cancer Mother        stage 4 breast cancer  . Diabetes Mother   . Stroke Maternal Grandmother   . Heart disease Maternal Grandmother   . Kidney disease Maternal Grandmother   . Hypertension Maternal Grandmother   . Diabetes Maternal Grandmother   . Cancer Maternal Grandfather        colon  . Hypertension Paternal Grandmother   . Hypertension Paternal Grandfather   . Cancer Paternal Grandfather        colon cancer  . Diabetes Father   . Healthy Brother   . Healthy Sister     Social History Social History   Tobacco Use  . Smoking status: Never Smoker  . Smokeless tobacco: Never Used  Substance Use Topics  . Alcohol use: No  . Drug use: No    Review of Systems Constitutional: Positive for fevers. Eyes: No visual changes. ENT: No sore throat. Cardiovascular: Denies chest pain. Respiratory: Positive for cough. Gastrointestinal: Positive for nausea and vomiting.  Genitourinary: Negative for dysuria. Musculoskeletal: Negative for back pain. Skin: Negative for rash. Neurological: Positive for near syncope  ____________________________________________   PHYSICAL EXAM:  VITAL SIGNS: ED Triage Vitals  Enc Vitals Group     BP 01/23/20 2217 103/62     Pulse Rate 01/23/20 2217 94     Resp  01/23/20 2217 18     Temp 01/23/20 2217 99.7 F (37.6 C)     Temp Source 01/23/20 2217 Oral     SpO2 01/23/20 2217 99 %     Weight 01/23/20 2215 170 lb (77.1 kg)     Height 01/23/20 2215 5\' 7"  (1.702 m)     Head Circumference --      Peak Flow --      Pain Score 01/23/20 2215 10   Constitutional: Alert and oriented.  Eyes: Conjunctivae are normal.  ENT      Head: Normocephalic and atraumatic.      Nose: No congestion/rhinnorhea.      Mouth/Throat: Mucous membranes are moist.      Neck: No stridor. Hematological/Lymphatic/Immunilogical: No cervical  lymphadenopathy. Cardiovascular: Normal rate, regular rhythm.  No murmurs, rubs, or gallops.  Respiratory: Normal respiratory effort without tachypnea nor retractions. Breath sounds are clear and equal bilaterally. No wheezes/rales/rhonchi. Gastrointestinal: Soft and non tender. No rebound. No guarding.  Genitourinary: Deferred Musculoskeletal: Normal range of motion in all extremities. No lower extremity edema. Neurologic:  Normal speech and language. No gross focal neurologic deficits are appreciated.  Skin:  Skin is warm, dry and intact. No rash noted. Psychiatric: Mood and affect are normal. Speech and behavior are normal. Patient exhibits appropriate insight and judgment.  ____________________________________________    LABS (pertinent positives/negatives)  BMP wnl except ca 8.7 CBC wbc 2.9, hgb 15.0, plt 148  ____________________________________________   EKG  I, Nance Pear, attending physician, personally viewed and interpreted this EKG  EKG Time: 2221 Rate: 86 Rhythm: normal sinus rhythm Axis: normal Intervals: qtc 402 QRS: narrow ST changes: no st elevation Impression: normal ekg  ____________________________________________    RADIOLOGY  None  ____________________________________________   PROCEDURES  Procedures  ____________________________________________   INITIAL IMPRESSION / ASSESSMENT AND PLAN / ED COURSE  Pertinent labs & imaging results that were available during my care of the patient were reviewed by me and considered in my medical decision making (see chart for details).   Patient presented to the emergency department today because of concerns for near syncopal episode.  Patient with recent diagnosis of Covid.  No concerning electrolyte abnormalities.  At this time do think likely patient had been feeling weak and had near-syncopal episode secondary to his Covid diagnosis.  Discussed this with the patient.  Will plan on discharging  home.  ____________________________________________   FINAL CLINICAL IMPRESSION(S) / ED DIAGNOSES  Final diagnoses:  COVID-19  Near syncope     Note: This dictation was prepared with Dragon dictation. Any transcriptional errors that result from this process are unintentional     Nance Pear, MD 01/24/20 [phone]

## 2020-01-24 NOTE — ED Notes (Signed)
Reviewed discharge instructions, follow-up care, and OTC pain relievers and antipyretics with patient. Patient verbalized understanding of all information reviewed. Patient stable, with no distress noted at this time.

## 2020-01-24 NOTE — Discharge Instructions (Addendum)
Please seek medical attention for any high fevers, chest pain, shortness of breath, change in behavior, persistent vomiting, bloody stool or any other new or concerning symptoms.  

## 2020-01-24 NOTE — ED Notes (Signed)
ED Provider at bedside. 

## 2021-10-22 ENCOUNTER — Encounter (HOSPITAL_COMMUNITY): Payer: Self-pay

## 2021-10-22 ENCOUNTER — Emergency Department (HOSPITAL_COMMUNITY)
Admission: EM | Admit: 2021-10-22 | Discharge: 2021-10-23 | Disposition: A | Discharge status: Home / Self Care | Payer: BC Managed Care – PPO | Attending: Emergency Medicine | Admitting: Emergency Medicine

## 2021-10-22 ENCOUNTER — Emergency Department (HOSPITAL_COMMUNITY): Payer: BC Managed Care – PPO

## 2021-10-22 ENCOUNTER — Other Ambulatory Visit: Payer: Self-pay

## 2021-10-22 DIAGNOSIS — R1110 Vomiting, unspecified: Secondary | ICD-10-CM | POA: Insufficient documentation

## 2021-10-22 DIAGNOSIS — R0989 Other specified symptoms and signs involving the circulatory and respiratory systems: Secondary | ICD-10-CM | POA: Diagnosis not present

## 2021-10-22 DIAGNOSIS — F458 Other somatoform disorders: Secondary | ICD-10-CM | POA: Insufficient documentation

## 2021-10-22 DIAGNOSIS — X58XXXA Exposure to other specified factors, initial encounter: Secondary | ICD-10-CM | POA: Diagnosis not present

## 2021-10-22 DIAGNOSIS — T17208A Unspecified foreign body in pharynx causing other injury, initial encounter: Secondary | ICD-10-CM | POA: Insufficient documentation

## 2021-10-22 MED ORDER — ALUM & MAG HYDROXIDE-SIMETH 200-200-20 MG/5ML PO SUSP
30.0000 mL | Freq: Once | ORAL | Status: AC
  Administered 2021-10-22: 30 mL via ORAL
  Filled 2021-10-22: qty 30

## 2021-10-22 MED ORDER — LIDOCAINE VISCOUS HCL 2 % MT SOLN
15.0000 mL | Freq: Once | OROMUCOSAL | Status: AC
  Administered 2021-10-22: 15 mL via ORAL
  Filled 2021-10-22: qty 15

## 2021-10-22 NOTE — ED Triage Notes (Signed)
Pt swallowed a piece of a plastic fork while eating dinner. Pt states that he can feel it in his throat.

## 2021-10-22 NOTE — ED Provider Notes (Signed)
[facility] DEPT Provider Note: Georgena Spurling, MD, FACEP  CSN: [id number] MRN: [id number] ARRIVAL: 10/22/21 at 2213 ROOM: [place]  foreign body in throat   HISTORY OF PRESENT ILLNESS  10/22/21 11:34 PM Zachary Fischer is a 41 y.o. male who accidentally swallowed the broken tine of a plastic fork while eating dinner several hours ago.  He now has a foreign body sensation in his lower throat.  It hurts to swallow but he is having no difficulty breathing.  He is able to swallow.   Past Medical History:  Diagnosis Date   Allergy     Past Surgical History:  Procedure Laterality Date   ESOPHAGOGASTRODUODENOSCOPY (EGD) WITH PROPOFOL N/A 05/13/2019   Procedure: ESOPHAGOGASTRODUODENOSCOPY (EGD) WITH PROPOFOL;  Surgeon: Jonathon Bellows, MD;  Location: [facility] ENDOSCOPY;  Service: Gastroenterology;  Laterality: N/A;   NECK SURGERY     08/19 lipoma     Family History  Problem Relation Age of Onset   Cancer Mother        stage 57 breast cancer   Diabetes Mother    Stroke Maternal Grandmother    Heart disease Maternal Grandmother    Kidney disease Maternal Grandmother    Hypertension Maternal Grandmother    Diabetes Maternal Grandmother    Cancer Maternal Grandfather        colon   Hypertension Paternal Grandmother    Hypertension Paternal Grandfather    Cancer Paternal Grandfather        colon cancer   Diabetes Father    Healthy Brother    Healthy Sister     Social History   Tobacco Use   Smoking status: Never   Smokeless tobacco: Never  Vaping Use   Vaping Use: Never used  Substance Use Topics   Alcohol use: No   Drug use: No    Prior to Admission medications   Medication Sig Start Date End Date Taking? Authorizing Provider  ondansetron (ZOFRAN ODT) 4 MG disintegrating tablet Take 1 tablet (4 mg total) by mouth every 8 (eight) hours as needed. 01/20/20   Lavonia Drafts, MD  pantoprazole (PROTONIX) 40 MG tablet Take 1 tablet (40 mg total) by mouth  daily. 05/13/19   Bettey Costa, MD  promethazine (PHENERGAN) 12.5 MG tablet Take 1 tablet (12.5 mg total) by mouth every 6 (six) hours as needed. 01/19/20   Merlyn Lot, MD    Allergies Patient has no known allergies.   REVIEW OF SYSTEMS  Negative except as noted here or in the History of Present Illness.   PHYSICAL EXAMINATION  Initial Vital Signs Blood pressure (!) 154/90, pulse 68, temperature 98.4 F (36.9 C), temperature source Oral, resp. rate 18, height 5\' 7"  (1.702 m), weight 81.6 kg, SpO2 94 %.  Examination General: Well-developed, well-nourished male in no acute distress; appearance consistent with age of record HENT: normocephalic; atraumatic; no foreign body seen in throat; no dysphonia; no stridor Eyes: Normal appearance Neck: supple Heart: regular rate and rhythm Lungs: clear to auscultation bilaterally Abdomen: soft; nondistended; nontender; bowel sounds present Extremities: No deformity; full range of motion; pulses normal Neurologic: Awake, alert and oriented; motor function intact in all extremities and symmetric; no facial droop Skin: Warm and dry Psychiatric: Normal mood and affect   RESULTS  Summary of this visit's results, reviewed and interpreted by myself:   EKG Interpretation  Date/Time:    Ventricular Rate:    PR Interval:    QRS Duration:   QT Interval:  QTC Calculation:   R Axis:     Text Interpretation:         Laboratory Studies: No results found for this or any previous visit (from the past 24 hour(s)). Imaging Studies: DG Neck Soft Tissue  Result Date: 10/23/2021 CLINICAL DATA:  Evaluate for foreign body, plastic fork. EXAM: NECK SOFT TISSUES - 1+ VIEW COMPARISON:  None. FINDINGS: There is no evidence of retropharyngeal soft tissue swelling or epiglottic enlargement. The cervical airway is unremarkable and no radio-opaque foreign body identified. IMPRESSION: Negative. Electronically Signed   By: Ronney Asters M.D.   On:  10/23/2021 00:06   DG Abdomen 1 View  Result Date: 10/23/2021 CLINICAL DATA:  Swallowed plastic fork tine EXAM: ABDOMEN - 1 VIEW COMPARISON:  None. FINDINGS: The bowel gas pattern is normal. No radio-opaque calculi or other significant radiographic abnormality are seen. No radiopaque foreign body is noted. IMPRESSION: No acute abnormality noted. Electronically Signed   By: Inez Catalina M.D.   On: 10/23/2021 00:06    ED COURSE and MDM  Nursing notes, initial and subsequent vitals signs, including pulse oximetry, reviewed and interpreted by myself.  Vitals:   10/22/21 2217  BP: (!) 154/90  Pulse: 68  Resp: 18  Temp: 98.4 F (36.9 C)  TempSrc: Oral  SpO2: 94%  Weight: 81.6 kg  Height: 5\' 7"  (1.702 m)   Medications  alum & mag hydroxide-simeth (MAALOX/MYLANTA) 200-200-20 MG/5ML suspension 30 mL (30 mLs Oral Given 10/22/21 2352)    And  lidocaine (XYLOCAINE) 2 % viscous mouth solution 15 mL (15 mLs Oral Given 10/22/21 2352)   12:32 AM Patient feeling better after oral Maalox and lidocaine.  No evidence of foreign body on radiographs but plastic does not always show up on radiographs.  The piece of plastic involved is less than half inch in length and I expect it abraded his throat and has passed into his stomach.  He was advised to drink copious fluids overnight and if he still has symptoms this morning he should return and we would consider gastroenterology consultation for endoscopy.  I do not believe that endoscopy is indicated at the present time as he is in no distress and is able to swallow.   PROCEDURES  Procedures   ED DIAGNOSES     ICD-10-CM   1. Foreign body sensation in throat  R09.89          Shanon Rosser, MD 10/23/21 [phone]

## 2021-10-23 ENCOUNTER — Other Ambulatory Visit: Payer: Self-pay

## 2021-10-23 ENCOUNTER — Emergency Department (HOSPITAL_COMMUNITY)
Admission: EM | Admit: 2021-10-23 | Discharge: 2021-10-23 | Disposition: A | Discharge status: Home / Self Care | Payer: BC Managed Care – PPO | Source: Home / Self Care | Attending: Student | Admitting: Student

## 2021-10-23 ENCOUNTER — Emergency Department (HOSPITAL_COMMUNITY): Payer: BC Managed Care – PPO

## 2021-10-23 ENCOUNTER — Encounter (HOSPITAL_COMMUNITY): Payer: Self-pay | Admitting: Emergency Medicine

## 2021-10-23 DIAGNOSIS — R1110 Vomiting, unspecified: Secondary | ICD-10-CM | POA: Insufficient documentation

## 2021-10-23 DIAGNOSIS — F458 Other somatoform disorders: Secondary | ICD-10-CM | POA: Insufficient documentation

## 2021-10-23 DIAGNOSIS — R0989 Other specified symptoms and signs involving the circulatory and respiratory systems: Secondary | ICD-10-CM

## 2021-10-23 LAB — I-STAT CHEM 8, ED
BUN: 7 mg/dL (ref 6–20)
Calcium, Ion: 1.21 mmol/L (ref 1.15–1.40)
Chloride: 101 mmol/L (ref 98–111)
Creatinine, Ser: 0.9 mg/dL (ref 0.61–1.24)
Glucose, Bld: 80 mg/dL (ref 70–99)
HCT: 44 % (ref 39.0–52.0)
Hemoglobin: 15 g/dL (ref 13.0–17.0)
Potassium: 3.9 mmol/L (ref 3.5–5.1)
Sodium: 139 mmol/L (ref 135–145)
TCO2: 29 mmol/L (ref 22–32)

## 2021-10-23 MED ORDER — LIDOCAINE VISCOUS HCL 2 % MT SOLN
15.0000 mL | Freq: Once | OROMUCOSAL | Status: AC
  Administered 2021-10-23: 15 mL via OROMUCOSAL
  Filled 2021-10-23: qty 15

## 2021-10-23 MED ORDER — IOHEXOL 300 MG/ML  SOLN
75.0000 mL | Freq: Once | INTRAMUSCULAR | Status: AC | PRN
  Administered 2021-10-23: 75 mL via INTRAVENOUS

## 2021-10-23 NOTE — ED Notes (Signed)
Patient given water and graham crackers

## 2021-10-23 NOTE — Discharge Instructions (Signed)
Please adhere to a liquid diet until you can be seen by gastroenterology

## 2021-10-23 NOTE — ED Provider Notes (Addendum)
[facility] DEPT Provider Note  CSN: [id number] Arrival date & time: 10/23/21 1626  Chief Complaint(s) No chief complaint on file.  HPI Zachary Fischer is a 41 y.o. male who presents emergency department for evaluation of performed the sensation in the esophagus.  Patient was seen last night after he swallowed a piece of a plastic fork.  His x-ray imaging was negative and symptoms controlled with viscous lidocaine he was ultimately discharged home with instructions to return to the emergency department if he is unable to tolerate p.o. or if his symptoms are getting worse.  Patient is been able to tolerate liquid but attempted to advance to solid foods today and he had 2 episodes of obstruction with subsequent vomiting.  He denies chest pain, shortness of breath, abdominal pain, nausea, headache, fever or other systemic symptoms.  HPI  Past Medical History Past Medical History:  Diagnosis Date   Allergy    Patient Active Problem List   Diagnosis Date Noted   Esophageal obstruction 05/13/2019   Routine general medical examination at a health care facility 05/29/2013   Home Medication(s) Prior to Admission medications   Medication Sig Start Date End Date Taking? Authorizing Provider  ondansetron (ZOFRAN ODT) 4 MG disintegrating tablet Take 1 tablet (4 mg total) by mouth every 8 (eight) hours as needed. 01/20/20   Lavonia Drafts, MD  pantoprazole (PROTONIX) 40 MG tablet Take 1 tablet (40 mg total) by mouth daily. 05/13/19   Bettey Costa, MD  promethazine (PHENERGAN) 12.5 MG tablet Take 1 tablet (12.5 mg total) by mouth every 6 (six) hours as needed. 01/19/20   Merlyn Lot, MD                                                                                                                                    Past Surgical History Past Surgical History:  Procedure Laterality Date   ESOPHAGOGASTRODUODENOSCOPY (EGD) WITH PROPOFOL N/A 05/13/2019   Procedure:  ESOPHAGOGASTRODUODENOSCOPY (EGD) WITH PROPOFOL;  Surgeon: Jonathon Bellows, MD;  Location: [facility] ENDOSCOPY;  Service: Gastroenterology;  Laterality: N/A;   NECK SURGERY     08/19 lipoma    Family History Family History  Problem Relation Age of Onset   Cancer Mother        stage 4 breast cancer   Diabetes Mother    Stroke Maternal Grandmother    Heart disease Maternal Grandmother    Kidney disease Maternal Grandmother    Hypertension Maternal Grandmother    Diabetes Maternal Grandmother    Cancer Maternal Grandfather        colon   Hypertension Paternal Grandmother    Hypertension Paternal Grandfather    Cancer Paternal Grandfather        colon cancer   Diabetes Father    Healthy Brother    Healthy Sister     Social History Social History   Tobacco Use   Smoking status: Never  Smokeless tobacco: Never  Vaping Use   Vaping Use: Never used  Substance Use Topics   Alcohol use: No   Drug use: No   Allergies Patient has no known allergies.  Review of Systems Review of Systems  Gastrointestinal:  Positive for vomiting.       Globus sensation   Physical Exam Vital Signs  I have reviewed the triage vital signs BP 118/77    Pulse 61    Temp 98.7 F (37.1 C) (Oral)    Resp 16    SpO2 98%   Physical Exam Vitals and nursing note reviewed.  Constitutional:      General: He is not in acute distress.    Appearance: He is well-developed.  HENT:     Head: Normocephalic and atraumatic.  Eyes:     Conjunctiva/sclera: Conjunctivae normal.  Cardiovascular:     Rate and Rhythm: Normal rate and regular rhythm.     Heart sounds: No murmur heard. Pulmonary:     Effort: Pulmonary effort is normal. No respiratory distress.     Breath sounds: Normal breath sounds.  Abdominal:     Palpations: Abdomen is soft.     Tenderness: There is no abdominal tenderness.  Musculoskeletal:        General: No swelling.     Cervical back: Neck supple.  Skin:    General: Skin is warm and dry.      Capillary Refill: Capillary refill takes less than 2 seconds.  Neurological:     Mental Status: He is alert.  Psychiatric:        Mood and Affect: Mood normal.    ED Results and Treatments Labs (all labs ordered are listed, but only abnormal results are displayed) Labs Reviewed  I-STAT CHEM 8, ED                                                                                                                          Radiology DG Neck Soft Tissue  Result Date: 10/23/2021 CLINICAL DATA:  Evaluate for foreign body, plastic fork. EXAM: NECK SOFT TISSUES - 1+ VIEW COMPARISON:  None. FINDINGS: There is no evidence of retropharyngeal soft tissue swelling or epiglottic enlargement. The cervical airway is unremarkable and no radio-opaque foreign body identified. IMPRESSION: Negative. Electronically Signed   By: Ronney Asters M.D.   On: 10/23/2021 00:06   DG Abdomen 1 View  Result Date: 10/23/2021 CLINICAL DATA:  Swallowed plastic fork tine EXAM: ABDOMEN - 1 VIEW COMPARISON:  None. FINDINGS: The bowel gas pattern is normal. No radio-opaque calculi or other significant radiographic abnormality are seen. No radiopaque foreign body is noted. IMPRESSION: No acute abnormality noted. Electronically Signed   By: Inez Catalina M.D.   On: 10/23/2021 00:06   CT Soft Tissue Neck W Contrast  Result Date: 10/23/2021 CLINICAL DATA:  Foreign body suspected, neck, neg xray EXAM: CT NECK WITH CONTRAST TECHNIQUE: Multidetector CT imaging of the neck was performed using the standard protocol  following the bolus administration of intravenous contrast. RADIATION DOSE REDUCTION: This exam was performed according to the departmental dose-optimization program which includes automated exposure control, adjustment of the mA and/or kV according to patient size and/or use of iterative reconstruction technique. CONTRAST:  61mL OMNIPAQUE IOHEXOL 300 MG/ML  SOLN COMPARISON:  None. FINDINGS: Pharynx and larynx: Normal. No mass or  swelling. Motion limited evaluation through the larynx. Salivary glands: No inflammation, mass, or stone. Thyroid: Normal. Lymph nodes: None enlarged or abnormal density. Vascular: Negative. Limited intracranial: Negative. Visualized orbits: Negative. Mastoids and visualized paranasal sinuses: Clear. Skeleton: No acute or aggressive process. Upper chest: Visualized lung apices are clear. IMPRESSION: No evidence of a foreign body or acute abnormality in the neck. Electronically Signed   By: Margaretha Sheffield M.D.   On: 10/23/2021 19:00    Pertinent labs & imaging results that were available during my care of the patient were reviewed by me and considered in my medical decision making (see MDM for details).  Medications Ordered in ED Medications  lidocaine (XYLOCAINE) 2 % viscous mouth solution 15 mL (15 mLs Mouth/Throat Given 10/23/21 1744)  iohexol (OMNIPAQUE) 300 MG/ML solution 75 mL (75 mLs Intravenous Contrast Given 10/23/21 1826)                                                                                                                                     Procedures Procedures  (including critical care time)  Medical Decision Making / ED Course   This patient presents to the ED for concern of foreign body sensation in esophagus, this involves an extensive number of treatment options, and is a complaint that carries with it a high risk of complications and morbidity.  The differential diagnosis includes retained foreign body, globus sensation, perforated esophagus, Boerhaave's  MDM: Patient seen emergency department for evaluation of foreign body sensation and difficulty swallowing.  Physical exam is unremarkable with no crepitus, cardiopulmonary exam unremarkable.  Low suspicion for Boerhaave's as patient is very well-appearing on exam.  I-STAT unremarkable.  CT soft tissue neck with no retained foreign body.  Patient is given viscous lidocaine and we attempted a solid p.o. challenge which  the patient failed.  He had persistent feelings of obstruction with associated vomiting.  He is able to tolerate liquid without difficulty.  Dr. Michail Sermon of [facility] gastroenterology was consulted who will help establish urgent outpatient follow-up for an EGD.  Patient is able to maintain his nutritional status with liquid diet at this time and is safe for discharge.  However, he was given strict return precautions including lack of ability to tolerate liquids at which he voiced understanding.  Patient then discharged with urgent outpatient GI follow-up.   Additional history obtained:  -External records from outside source obtained and reviewed including: Chart review including previous notes, labs, imaging, consultation notes   Lab Tests: -I ordered, reviewed, and interpreted labs.   The pertinent results  include:   Labs Reviewed  I-STAT CHEM 8, ED       Imaging Studies ordered: I ordered imaging studies including CT Neck I independently visualized and interpreted imaging. I agree with the radiologist interpretation   Medicines ordered and prescription drug management: Meds ordered this encounter  Medications   lidocaine (XYLOCAINE) 2 % viscous mouth solution 15 mL   iohexol (OMNIPAQUE) 300 MG/ML solution 75 mL    -I have reviewed the patients home medicines and have made adjustments as needed  Critical interventions none  Consultations Obtained: I requested consultation with the gastroenterologist,  and discussed lab and imaging findings as well as pertinent plan - they recommend: Outpatient EGD  Social Determinants of Health:  Factors impacting patients care include: Difficulty scheduling medical appointments due to daughter schedule   Reevaluation: After the interventions noted above, I reevaluated the patient and found that they have :stayed the same  Co morbidities that complicate the patient evaluation  Past Medical History:  Diagnosis Date   Allergy        Dispostion: I considered admission for this patient, and I offered admission for the patient, but he states he needs to go home and take care of his daughter.  As he is maintaining his nutritional status with liquid diet at this time he is safe for urgent outpatient follow-up in GI clinic     Final Clinical Impression(s) / ED Diagnoses Final diagnoses:  Globus sensation     @PCDICTATION @    Teressa Lower, MD 10/23/21 2024    Teressa Lower, MD 10/23/21 2024

## 2021-10-23 NOTE — ED Triage Notes (Signed)
Patient reports he was seen yesterday, had a piece of plastic fork break off and he swallowed it, states he has been able to drink water but not keep solid food down, still feels the FB sensation.

## 2022-11-02 IMAGING — CT CT NECK W/ CM
3 of 4 series · 13 of 33 positions shown, 16 images · IV contrast (agent unspecified)
Comparison: None.

CLINICAL DATA: Foreign body suspected, neck, neg xray

EXAM:
CT NECK WITH CONTRAST
TECHNIQUE: Multidetector CT imaging of the neck was performed using the
standard protocol following the bolus administration of intravenous
contrast.

[Series 4: axial · axial · 0.47mm/px · z∈[-319,-160]mm · 5 of 128 slices shown, 7 images]
[im 19/128  soft-tissue]
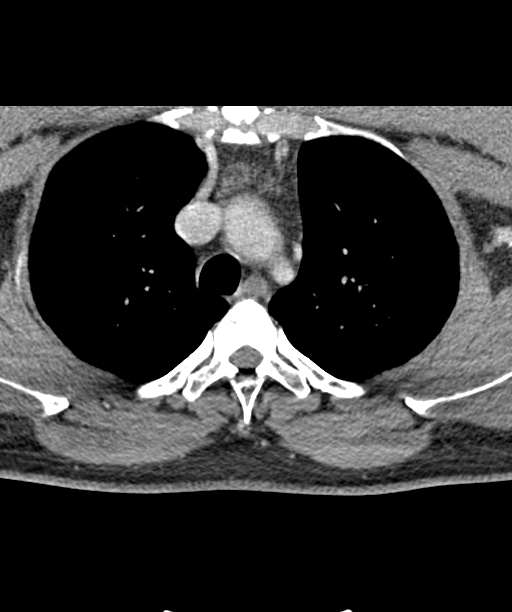
[im 19/128  bone]
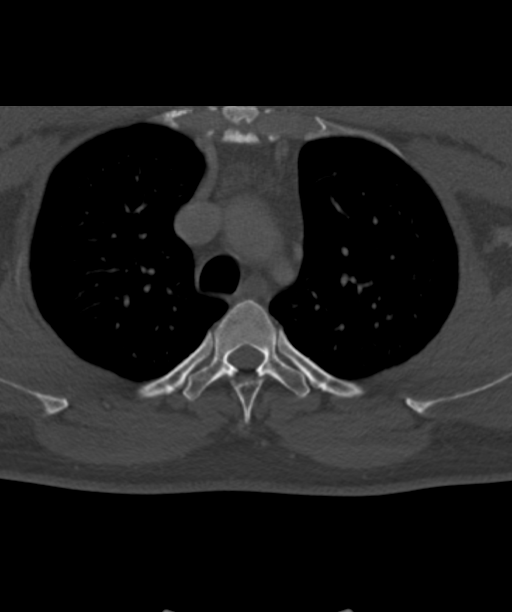
[im 37/128  bone]
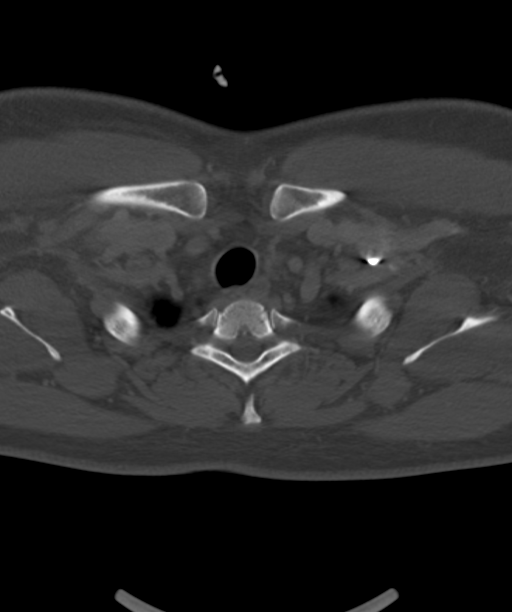
[im 73/128  bone]
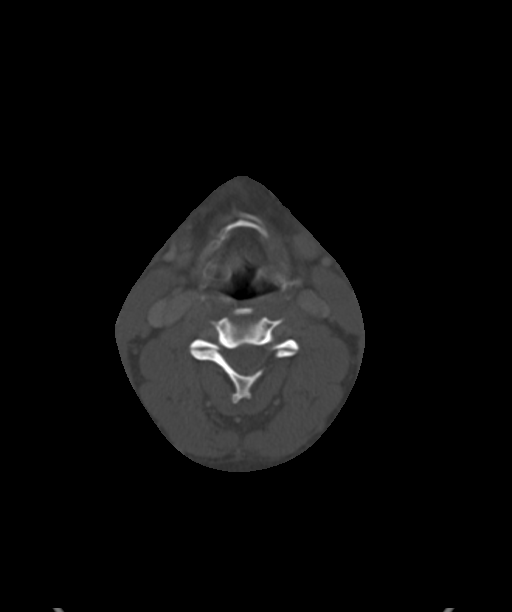
[im 91/128  bone]
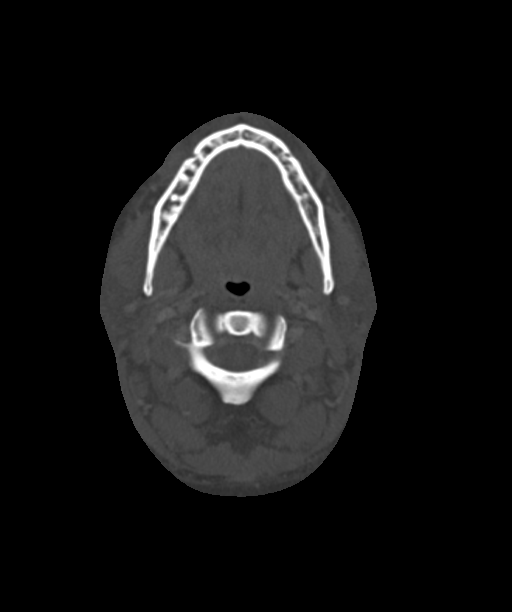
[im 109/128  soft-tissue]
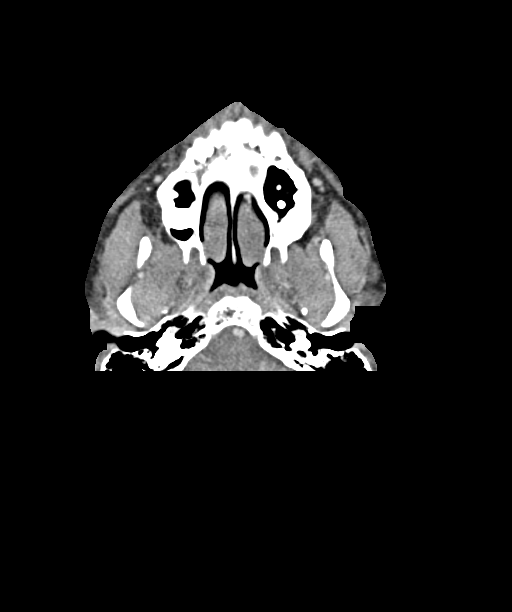
[im 109/128  bone]
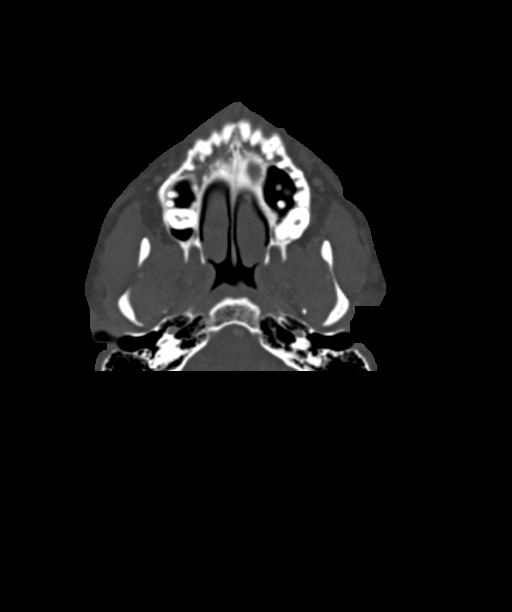

[Series 5: coronal · coronal · 0.46mm/px · 3 of 110 slices shown]
[im 36/110  bone]
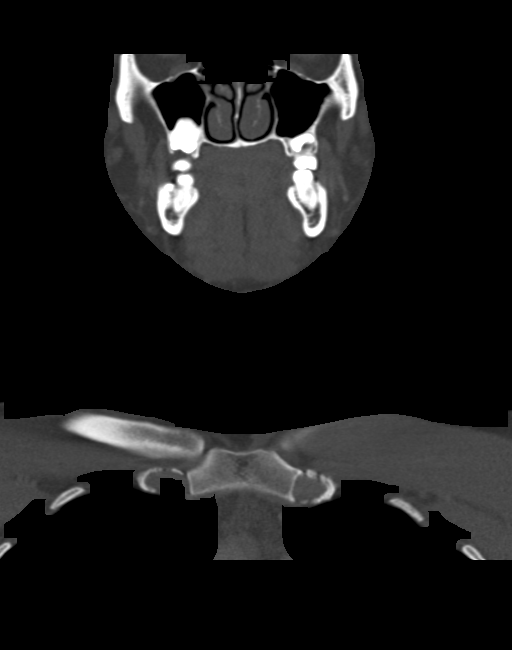
[im 49/110  bone]
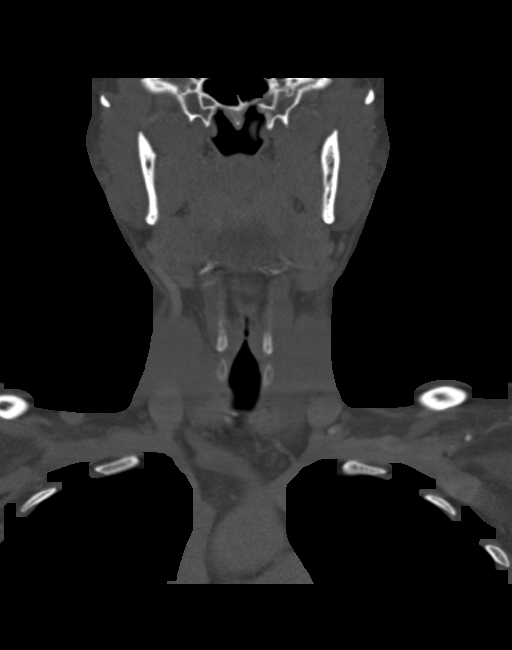
[im 61/110  bone]
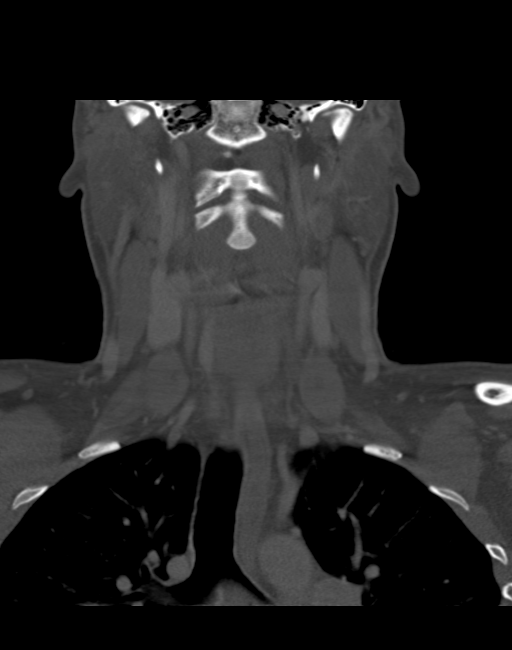

[Series 6: sagittal · sagittal · 0.48mm/px · 5 of 82 slices shown, 6 images]
[im 28/82  bone]
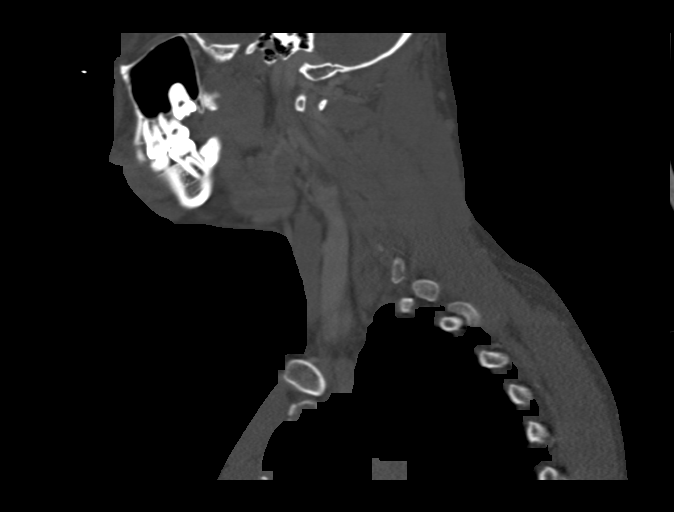
[im 34/82  bone]
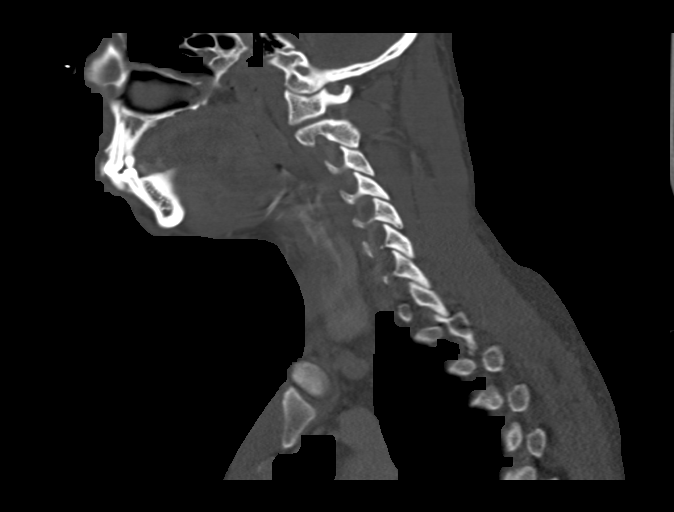
[im 41/82  soft-tissue]
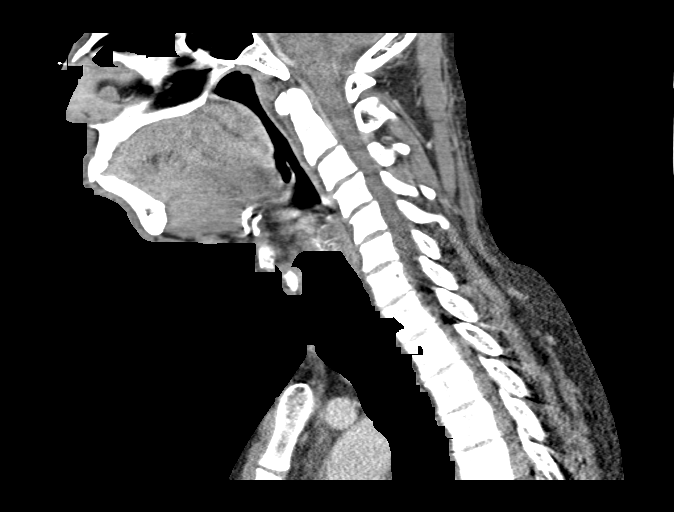
[im 41/82  bone]
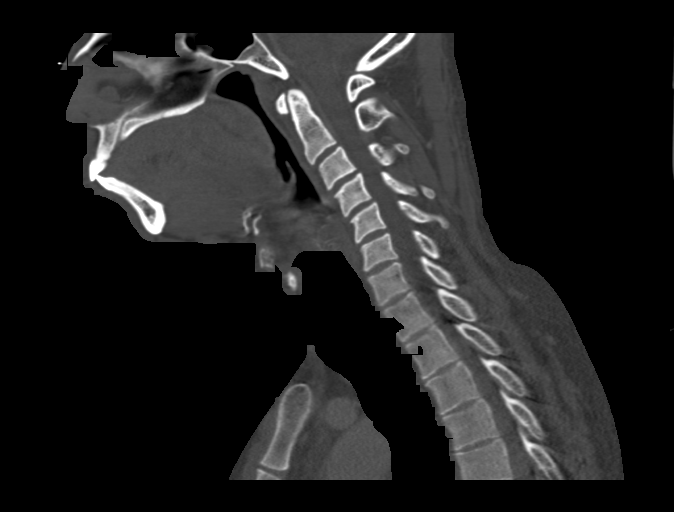
[im 48/82  bone]
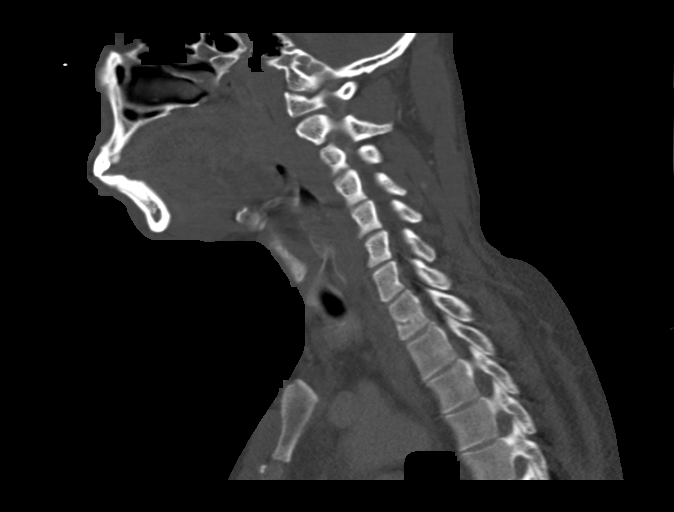
[im 55/82  bone]
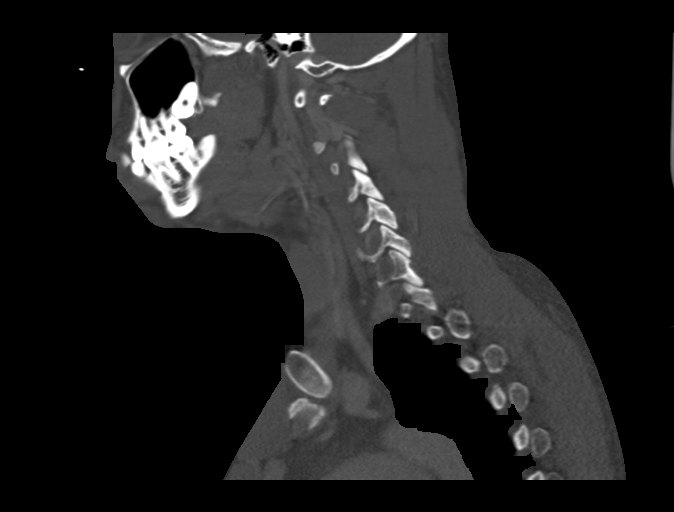

[13 of 33 positions shown; findings below may reference images not displayed]

RADIATION DOSE REDUCTION: This exam was performed according to the
departmental dose-optimization program which includes automated
exposure control, adjustment of the mA and/or kV according to
patient size and/or use of iterative reconstruction technique.

CONTRAST:  75mL OMNIPAQUE IOHEXOL 300 MG/ML  SOLN
FINDINGS: Pharynx and larynx: Normal. No mass or swelling. Motion limited
evaluation through the larynx.

Salivary glands: No inflammation, mass, or stone.

Thyroid: Normal.

Lymph nodes: None enlarged or abnormal density.

Vascular: Negative.

Limited intracranial: Negative.

Visualized orbits: Negative.

Mastoids and visualized paranasal sinuses: Clear.

Skeleton: No acute or aggressive process.

Upper chest: Visualized lung apices are clear.
IMPRESSION: No evidence of a foreign body or acute abnormality in the neck.

## 2022-11-02 IMAGING — CR DG ABDOMEN 1V
1 series · 1 of 1 positions shown · non-contrast
Comparison: None.

CLINICAL DATA: Swallowed plastic Nguyenvanha Knts

EXAM:
ABDOMEN - 1 VIEW

[x abdomen supine]
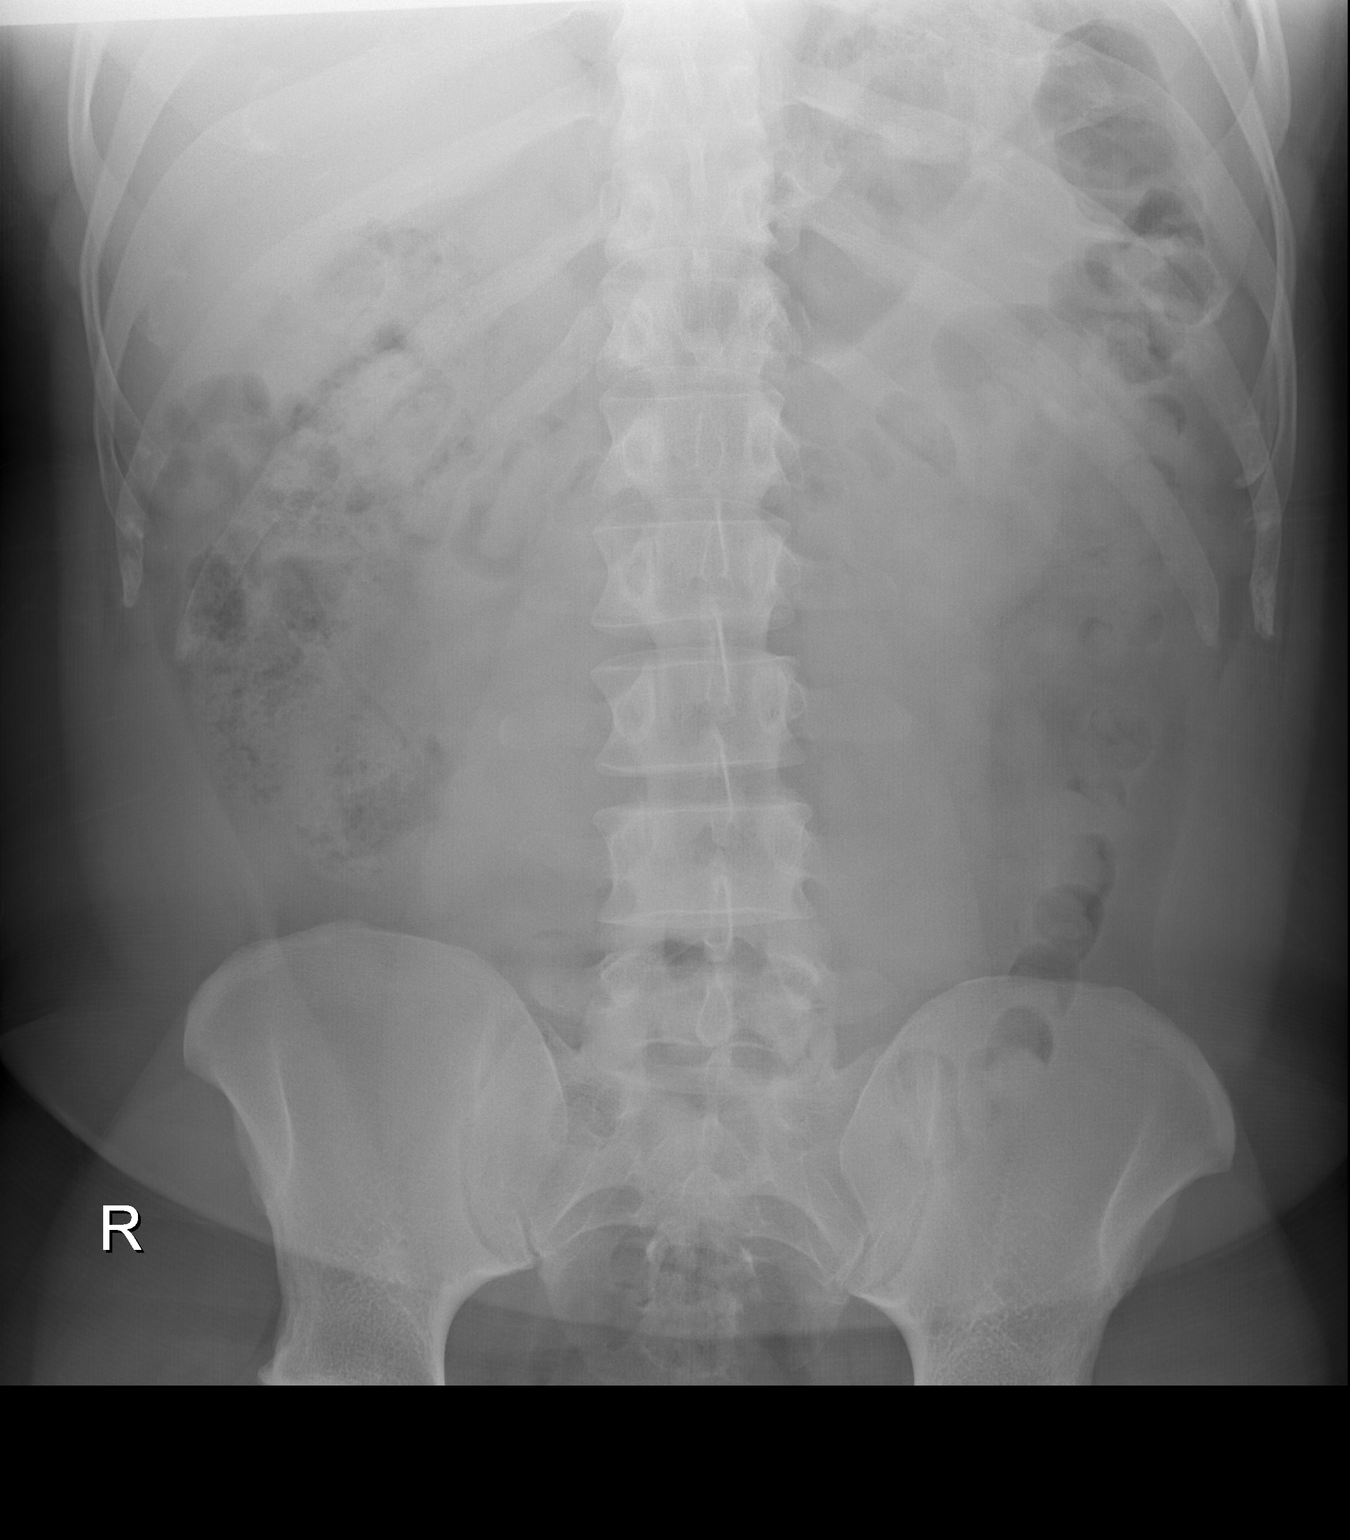

[1 of 1 positions shown; findings below may reference images not displayed]

FINDINGS: The bowel gas pattern is normal. No radio-opaque calculi or other
significant radiographic abnormality are seen. No radiopaque foreign
body is noted.
IMPRESSION: No acute abnormality noted.

## 2023-11-08 ENCOUNTER — Other Ambulatory Visit (INDEPENDENT_AMBULATORY_CARE_PROVIDER_SITE_OTHER)

## 2023-11-08 ENCOUNTER — Encounter: Payer: Self-pay | Admitting: Physician Assistant

## 2023-11-08 ENCOUNTER — Ambulatory Visit (INDEPENDENT_AMBULATORY_CARE_PROVIDER_SITE_OTHER): Payer: BC Managed Care – PPO | Admitting: Physician Assistant

## 2023-11-08 ENCOUNTER — Encounter: Payer: Self-pay | Admitting: Radiology

## 2023-11-08 DIAGNOSIS — M25561 Pain in right knee: Secondary | ICD-10-CM

## 2023-11-08 NOTE — Progress Notes (Signed)
Office Visit Note   Patient: Zachary Fischer           Date of Birth: November 29, 1980           MRN: [id number] Visit Date: 11/08/2023              Requested by: Donita Brooks, MD [address] [place],  [place] [address] PCP: Donita Brooks, MD   Assessment & Plan: Visit Diagnoses:  1. 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  He has had 2 years of difficulties with his right greater than left knee.  He has popping and catching but he just assumed this was secondary to the amount of marching he does in the band.  Last Sunday he was doing lateral movement exercises and felt a pop in his knee.  It was significant and painful enough that he went to an urgent care.  His x-rays do not show any significant arthritis.  He does have quite a few mechanical symptoms including catching popping locking and has reproducible pain with terminal flexion and extension.  Given this has been going on for 2 years and now is acute enough that he has to wear a brace and with mechanical symptoms I like for him to get an MRI.  I will review this I am concerned for meniscus tear.  If he does indeed have meniscus pathology will refer him to one of our surgeons  Follow-Up Instructions: Return for Will call after MRI.   Orders:  Orders Placed This Encounter  Procedures   XR Knee 1-2 Views Right   MR Knee Right w/o contrast   No orders of the defined types were placed in this encounter.     Procedures: No procedures performed   Clinical Data: No additional findings.   Subjective: Chief Complaint  Patient presents with   Right Knee - Pain    HPI patient is a pleasant 43 year old gentleman with a history of right knee pain.  He states this he gets aching popping and sharp pain.  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  He said he was doing drills felt a pop sharp pain was unable  to stand or walk.  No swelling.  Review of Systems  All other systems reviewed and are negative.    Objective: Vital Signs: There were no vitals taken for this visit.  Physical Exam Constitutional:      Appearance: Normal appearance.  Pulmonary:     Effort: Pulmonary effort is normal.  Skin:    General: Skin is warm and dry.  Neurological:     General: No focal deficit present.     Mental Status: He is alert and oriented to person, place, and time.  Psychiatric:        Mood and Affect: Mood normal.        Behavior: Behavior normal.     Ortho Exam Right knee no effusion no erythema compartments are soft and compressible he is neurovascularly intact.  He has a good endpoint on anterior draw.  He has pain both on the medial lateral joint line and a little bit over the patellofemoral joint no significant grinding.  He has good varus valgus stability.  Neurovascular intact.  He has increased pain with terminal flexion and extension Specialty Comments:  No specialty comments available.  Imaging: XR Knee 1-2 Views Right Result Date: 11/08/2023 Radiographs of the right knee were obtained today well-maintained alignment well-preserved joint spacing no acute changes    PMFS History: Patient Active Problem List   Diagnosis Date Noted   Pain in right knee 11/08/2023   Esophageal obstruction 05/13/2019   Routine general medical examination at a health care facility 05/29/2013   Past Medical History:  Diagnosis Date   Allergy     Family History  Problem Relation Age of Onset   Cancer Mother        stage 4 breast cancer   Diabetes Mother    Stroke Maternal Grandmother    Heart disease Maternal Grandmother    Kidney disease Maternal Grandmother    Hypertension Maternal Grandmother    Diabetes Maternal Grandmother    Cancer Maternal Grandfather        colon   Hypertension Paternal Grandmother    Hypertension Paternal Grandfather    Cancer Paternal Grandfather         colon cancer   Diabetes Father    Healthy Brother    Healthy Sister     Past Surgical History:  Procedure Laterality Date   ESOPHAGOGASTRODUODENOSCOPY (EGD) WITH PROPOFOL N/A 05/13/2019   Procedure: ESOPHAGOGASTRODUODENOSCOPY (EGD) WITH PROPOFOL;  Surgeon: Wyline Mood, MD;  Location: [facility] ENDOSCOPY;  Service: Gastroenterology;  Laterality: N/A;   NECK SURGERY     08/19 lipoma    Social History   Occupational History   Not on file  Tobacco Use   Smoking status: Never   Smokeless tobacco: Never  Vaping Use   Vaping status: Never Used  Substance and Sexual Activity   Alcohol use: No   Drug use: No   Sexual activity: Not on file

## 2023-11-18 ENCOUNTER — Ambulatory Visit
Admission: RE | Admit: 2023-11-18 | Discharge: 2023-11-18 | Disposition: A | Discharge status: Home / Self Care | Source: Ambulatory Visit | Attending: Physician Assistant | Admitting: Physician Assistant

## 2023-11-18 DIAGNOSIS — M25561 Pain in right knee: Secondary | ICD-10-CM

## 2023-11-20 ENCOUNTER — Emergency Department (HOSPITAL_COMMUNITY): Payer: BC Managed Care – PPO

## 2023-11-20 ENCOUNTER — Encounter (HOSPITAL_COMMUNITY): Payer: Self-pay | Admitting: *Deleted

## 2023-11-20 ENCOUNTER — Emergency Department (HOSPITAL_COMMUNITY)
Admission: EM | Admit: 2023-11-20 | Discharge: 2023-11-21 | Discharge status: Against Medical Advice | Payer: BC Managed Care – PPO | Attending: Emergency Medicine | Admitting: Emergency Medicine

## 2023-11-20 ENCOUNTER — Other Ambulatory Visit: Payer: Self-pay

## 2023-11-20 DIAGNOSIS — X501XXA Overexertion from prolonged static or awkward postures, initial encounter: Secondary | ICD-10-CM | POA: Insufficient documentation

## 2023-11-20 DIAGNOSIS — Y9389 Activity, other specified: Secondary | ICD-10-CM | POA: Diagnosis not present

## 2023-11-20 DIAGNOSIS — S6992XA Unspecified injury of left wrist, hand and finger(s), initial encounter: Secondary | ICD-10-CM | POA: Diagnosis present

## 2023-11-20 MED ORDER — OXYCODONE-ACETAMINOPHEN 5-325 MG PO TABS
1.0000 | ORAL_TABLET | Freq: Once | ORAL | Status: AC
  Administered 2023-11-20: 1 via ORAL
  Filled 2023-11-20: qty 1

## 2023-11-20 NOTE — ED Triage Notes (Signed)
 The pt was  falling out of his chair and  he bent his lt thumb backward around 2030 he is c/o pain in his entire lt thumb and hand and he has much difficulty moving it

## 2023-11-21 DIAGNOSIS — S6992XA Unspecified injury of left wrist, hand and finger(s), initial encounter: Secondary | ICD-10-CM | POA: Diagnosis not present

## 2023-11-21 NOTE — Progress Notes (Signed)
 Orthopedic Tech Progress Note Patient Details:  Zachary Fischer Aug 18, 1981 [id number]  Ortho Devices Type of Ortho Device: Thumb velcro splint Ortho Device/Splint Location: lue Ortho Device/Splint Interventions: Ordered, Application, Adjustment   Post Interventions Patient Tolerated: Well Instructions Provided: Care of device, Adjustment of device  Trinna Post 11/21/2023, 6:27 AM

## 2023-11-21 NOTE — ED Notes (Signed)
 Pt called for rooming, no response, no sight of pt in lobby.

## 2023-11-21 NOTE — Discharge Instructions (Addendum)
 Follow-up with your orthopedist. Can ice the hand, tylenol/motrin for pain. Return here for new concerns.

## 2023-11-21 NOTE — ED Provider Notes (Signed)
 [place] EMERGENCY DEPARTMENT AT [facility] Provider Note   CSN: [id number] Arrival date & time: 11/20/23  2021     History  Chief Complaint  Patient presents with   thumb injury    Zachary Fischer is a 43 y.o. male.  The history is provided by the patient and medical records.   43 y.o. M here with left thumb injury.  He was trying to get up from the sofa and lost his balance and fell backwards, thumb wedged in between the seat and armrest.  Thumb hyperextended backwards and he felt a "pop".  States immediate onset of pain.  He is right hand dominant.  No intervention tried PTA.  Home Medications Prior to Admission medications   Medication Sig Start Date End Date Taking? Authorizing Provider  ondansetron (ZOFRAN ODT) 4 MG disintegrating tablet Take 1 tablet (4 mg total) by mouth every 8 (eight) hours as needed. 01/20/20   Jene Every, MD  pantoprazole (PROTONIX) 40 MG tablet Take 1 tablet (40 mg total) by mouth daily. 05/13/19   Adrian Saran, MD  promethazine (PHENERGAN) 12.5 MG tablet Take 1 tablet (12.5 mg total) by mouth every 6 (six) hours as needed. 01/19/20   Willy Eddy, MD      Allergies    Patient has no known allergies.    Review of Systems   Review of Systems  Musculoskeletal:  Positive for arthralgias.  All other systems reviewed and are negative.   Physical Exam Updated Vital Signs BP 123/87 (BP Location: Right Arm)   Pulse 66   Temp 98 F (36.7 C) (Oral)   Resp 18   Ht 5\' 7"  (1.702 m)   Wt 81.6 kg   SpO2 97%   BMI 28.18 kg/m   Physical Exam Vitals and nursing note reviewed.  Constitutional:      Appearance: He is well-developed.  HENT:     Head: Normocephalic and atraumatic.  Eyes:     Conjunctiva/sclera: Conjunctivae normal.     Pupils: Pupils are equal, round, and reactive to light.  Cardiovascular:     Rate and Rhythm: Normal rate and regular rhythm.     Heart sounds: Normal heart sounds.  Pulmonary:     Effort:  Pulmonary effort is normal.     Breath sounds: Normal breath sounds.  Abdominal:     General: Bowel sounds are normal.     Palpations: Abdomen is soft.  Musculoskeletal:        General: Normal range of motion.     Cervical back: Normal range of motion.     Comments: Left thumb is normal in appearance without swelling or acute bony deformity, pain elicited with any attempted movement, radial pulse intact, normal distal sensation, normal cap refill  Skin:    General: Skin is warm and dry.  Neurological:     Mental Status: He is alert and oriented to person, place, and time.     ED Results / Procedures / Treatments   Labs (all labs ordered are listed, but only abnormal results are displayed) Labs Reviewed - No data to display  EKG None  Radiology DG Finger Thumb Left Result Date: 11/21/2023 CLINICAL DATA:  Injury EXAM: LEFT THUMB 2+V COMPARISON:  None Available. FINDINGS: There is no evidence of fracture or dislocation. There is no evidence of arthropathy or other focal bone abnormality. Soft tissues are unremarkable. IMPRESSION: Negative. Electronically Signed   By: Darliss Cheney M.D.   On: 11/21/2023 00:08  Procedures Procedures    Medications Ordered in ED Medications  oxyCODONE-acetaminophen (PERCOCET/ROXICET) 5-325 MG per tablet 1 tablet (1 tablet Oral Given 11/20/23 2333)    ED Course/ Medical Decision Making/ A&P                                 Medical Decision Making Amount and/or Complexity of Data Reviewed Radiology: ordered and independent interpretation performed.  Risk Prescription drug management.   43 year old male here with left thumb injury when thumb got wedged between couch cushion and armrest.  Sounds like he hyperextended the thumb.  There is no acute deformity noted on exam.  He has pain with any attempted movement.  Hand remains neurovascularly intact.  X-rays negative for any acute bony findings.  Will place in thumb spica, encouraged ice and  anti-inflammatories.  He already has orthopedist that he can follow-up with.  Return here for new concerns.  Final Clinical Impression(s) / ED Diagnoses Final diagnoses:  Thumb injury, left, initial encounter    Rx / DC Orders ED Discharge Orders     None         Garlon Hatchet, PA-C 11/21/23 0552    Sabas Sous, MD 11/21/23 [phone]

## 2023-11-26 ENCOUNTER — Ambulatory Visit (INDEPENDENT_AMBULATORY_CARE_PROVIDER_SITE_OTHER): Payer: BC Managed Care – PPO | Admitting: Physician Assistant

## 2023-11-26 ENCOUNTER — Encounter: Payer: Self-pay | Admitting: Physician Assistant

## 2023-11-26 DIAGNOSIS — S63602A Unspecified sprain of left thumb, initial encounter: Secondary | ICD-10-CM | POA: Insufficient documentation

## 2023-11-26 NOTE — Progress Notes (Signed)
 Office Visit Note   Patient: Zachary Fischer           Date of Birth: Apr 03, 1981           MRN: [id number] Visit Date: 11/26/2023              Requested by: Donita Brooks, MD 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  He comes in today to review MRI of his right knee.  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  He had no particular injury.  He says the knee is feeling a little bit better Also comes in today he is right-hand dominant last week he sustained a fall onto a couch.  His thumb went between 2 pillows and ended up abducting over the top of the dorsal surface of his hand.  He denies any pop sensation.  He did go to an urgent care was given a thumb spica splint.  Complains of pain over the radial side of the thumb.  Assessment & Plan: Visit Diagnoses: I reviewed his MRI with Dr. Ophelia Charter.  MRI demonstrates no significant cartilage or ligament damage.  He has possible scarring or localized nodular synovitis in the patella femoral fat without any erosion.  This is in the area where he is has problems.  Can treat this symptomatically at some time could try a steroid injection Also left thumb sprain.  Question injury to the radial collateral ligament.  He does have pain along the the ligament but is able to resist with abduction and abduction.  Does have pain with opposition of thumb to short finger.  Will keep him immobilized for couple weeks have him come back at that time and reexamine him.  Plan: Will follow-up in 2 weeks for reevaluation  Follow-Up Instructions: No follow-ups on file.   Ortho Exam  Patient is alert, oriented, no adenopathy, well-dressed, normal affect, normal respiratory  effort. Examination of his left thumb minimal swelling he is and immobilized in his thumb spica splint pulses intact has pain with abduction with a little bit of weakness is able to oppose all his fingers but painful when opposing the thumb to the small finger.  Can flex and extend the thumb.  No tenderness on the ulnar side.  Imaging: No results found. No images are attached to the encounter.  Labs: Lab Results  Component Value Date   ESRSEDRATE 1 06/14/2014     Lab Results  Component Value Date   ALBUMIN 4.6 05/12/2019   ALBUMIN 4.3 06/11/2014   ALBUMIN 4.6 05/29/2013    No results found for: "MG" No results found for: "VD25OH"  No results found for: "PREALBUMIN"    Latest Ref Rng & Units 10/23/2021    6:01 PM 01/23/2020   10:26 PM 01/19/2020    6:45 PM  CBC EXTENDED  WBC 4.0 - 10.5 K/uL  2.9  5.2   RBC 4.22 - 5.81 MIL/uL  5.09  4.39   Hemoglobin 13.0 - 17.0 g/dL 62.1  30.8  65.7   HCT 39.0 - 52.0 % 44.0  44.1  38.7   Platelets 150 - 400 K/uL  148  159  There is no height or weight on file to calculate BMI.  Orders:  No orders of the defined types were placed in this encounter.  No orders of the defined types were placed in this encounter.    Procedures: No procedures performed  Clinical Data: No additional findings.  ROS:  All other systems negative, except as noted in the HPI. Review of Systems  Objective: Vital Signs: There were no vitals taken for this visit.  Specialty Comments:  No specialty comments available.  PMFS History: Patient Active Problem List   Diagnosis Date Noted   Pain in right knee 11/08/2023   Esophageal obstruction 05/13/2019   Routine general medical examination at a health care facility 05/29/2013   Past Medical History:  Diagnosis Date   Allergy     Family History  Problem Relation Age of Onset   Cancer Mother        stage 4 breast cancer   Diabetes Mother    Stroke Maternal Grandmother    Heart disease  Maternal Grandmother    Kidney disease Maternal Grandmother    Hypertension Maternal Grandmother    Diabetes Maternal Grandmother    Cancer Maternal Grandfather        colon   Hypertension Paternal Grandmother    Hypertension Paternal Grandfather    Cancer Paternal Grandfather        colon cancer   Diabetes Father    Healthy Brother    Healthy Sister     Past Surgical History:  Procedure Laterality Date   ESOPHAGOGASTRODUODENOSCOPY (EGD) WITH PROPOFOL N/A 05/13/2019   Procedure: ESOPHAGOGASTRODUODENOSCOPY (EGD) WITH PROPOFOL;  Surgeon: Wyline Mood, MD;  Location: [facility] ENDOSCOPY;  Service: Gastroenterology;  Laterality: N/A;   NECK SURGERY     08/19 lipoma    Social History   Occupational History   Not on file  Tobacco Use   Smoking status: Never   Smokeless tobacco: Never  Vaping Use   Vaping status: Never Used  Substance and Sexual Activity   Alcohol use: No   Drug use: No   Sexual activity: Not on file

## 2023-11-28 ENCOUNTER — Encounter: Payer: Self-pay | Admitting: Radiology

## 2023-11-28 ENCOUNTER — Telehealth: Payer: Self-pay | Admitting: Physician Assistant

## 2023-11-28 NOTE — Telephone Encounter (Signed)
 Pt called requesting a call back from Chales Abrahams. Pt did not leave reason for call back. Pt phone number is [phone].

## 2023-11-28 NOTE — Telephone Encounter (Signed)
Note has been sent through MyChart

## 2023-11-29 NOTE — Telephone Encounter (Signed)
 Patient states mychart letter  is okay and does not need to be emailed

## 2023-12-10 ENCOUNTER — Encounter: Payer: Self-pay | Admitting: Physician Assistant

## 2023-12-10 ENCOUNTER — Ambulatory Visit (INDEPENDENT_AMBULATORY_CARE_PROVIDER_SITE_OTHER): Admitting: Physician Assistant

## 2023-12-10 DIAGNOSIS — S63602A Unspecified sprain of left thumb, initial encounter: Secondary | ICD-10-CM

## 2023-12-10 DIAGNOSIS — S63609A Unspecified sprain of unspecified thumb, initial encounter: Secondary | ICD-10-CM | POA: Insufficient documentation

## 2023-12-10 NOTE — Progress Notes (Signed)
 Office Visit Note   Patient: Zachary Fischer           Date of Birth: [birth year]           MRN: [id number] Visit Date: 12/10/2023              Requested by: Donita Brooks, MD [address] 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  He is now about 4 weeks status post falling and having his left thum adduction over the dorsum of his hand.  Since then he has had very limited use of his left thumb.  We did immobilize him in a thumb spica but he is gotten no better.  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  He has weak to no abductor strength.  Worry about a ligamentous tear.  Because of this of asked to get an MRI  Assessment & Plan: Visit Diagnoses: Radial ligament tear left thumb  Plan: I believe his exam is actually deteriorated he now has very little strength in the thumb.  Because of this I have ordered an MRI in an emergent fashion.  I will review it with Dr. Fara Boros.  Would make referral  Follow-Up Instructions: After MRI  Ortho Exam  Patient is alert, oriented, no adenopathy, well-dressed, normal affect, normal respiratory effort. Examination of his left thumb radial pulses intact no ecchymosis mild soft tissue swelling focally tender over the radial side of the thumb.  Has no resistance strength to abduction has difficulty opposing thumb to other fingers  Imaging: No results found. No images are attached to the encounter.  Labs: Lab Results  Component Value Date   ESRSEDRATE 1 06/14/2014     Lab Results  Component Value Date   ALBUMIN 4.6 05/12/2019   ALBUMIN 4.3 06/11/2014   ALBUMIN 4.6 05/29/2013    No results found for: "MG" No results found for: "VD25OH"  No results found for: "PREALBUMIN"     Latest Ref Rng & Units 10/23/2021    6:01 PM 01/23/2020   10:26 PM 01/19/2020    6:45 PM  CBC EXTENDED  WBC 4.0 - 10.5 K/uL  2.9  5.2   RBC 4.22 - 5.81 MIL/uL  5.09  4.39   Hemoglobin 13.0 - 17.0 g/dL 46.9  62.9  52.8   HCT 39.0 - 52.0 % 44.0  44.1  38.7   Platelets 150 - 400 K/uL  148  159      There is no height or weight on file to calculate BMI.  Orders:  No orders of the defined types were placed in this encounter.  No orders of the defined types were placed in this encounter.    Procedures: No procedures performed  Clinical Data: No additional findings.  ROS:  All other systems negative, except as noted in the HPI. Review of Systems  Objective: Vital Signs: There were no vitals taken for this visit.  Specialty Comments:  No specialty comments available.  PMFS History: Patient Active Problem List   Diagnosis Date Noted   Complete tear of ligament of thumb 12/10/2023   Left thumb sprain 11/26/2023   Pain in right knee 11/08/2023  Esophageal obstruction 05/13/2019   Routine general medical examination at a health care facility 05/29/2013   Past Medical History:  Diagnosis Date   Allergy     Family History  Problem Relation Age of Onset   Cancer Mother        stage 4 breast cancer   Diabetes Mother    Stroke Maternal Grandmother    Heart disease Maternal Grandmother    Kidney disease Maternal Grandmother    Hypertension Maternal Grandmother    Diabetes Maternal Grandmother    Cancer Maternal Grandfather        colon   Hypertension Paternal Grandmother    Hypertension Paternal Grandfather    Cancer Paternal Grandfather        colon cancer   Diabetes Father    Healthy Brother    Healthy Sister     Past Surgical History:  Procedure Laterality Date   ESOPHAGOGASTRODUODENOSCOPY (EGD) WITH PROPOFOL N/A 05/13/2019   Procedure: ESOPHAGOGASTRODUODENOSCOPY (EGD) WITH PROPOFOL;  Surgeon: Wyline Mood, MD;  Location: [facility] ENDOSCOPY;  Service:  Gastroenterology;  Laterality: N/A;   NECK SURGERY     08/19 lipoma    Social History   Occupational History   Not on file  Tobacco Use   Smoking status: Never   Smokeless tobacco: Never  Vaping Use   Vaping status: Never Used  Substance and Sexual Activity   Alcohol use: No   Drug use: No   Sexual activity: Not on file

## 2023-12-26 ENCOUNTER — Other Ambulatory Visit

## 2023-12-30 ENCOUNTER — Encounter: Payer: Self-pay | Admitting: Physician Assistant

## 2023-12-31 ENCOUNTER — Ambulatory Visit
Admission: RE | Admit: 2023-12-31 | Discharge: 2023-12-31 | Disposition: A | Discharge status: Home / Self Care | Source: Ambulatory Visit | Attending: Physician Assistant | Admitting: Physician Assistant

## 2023-12-31 DIAGNOSIS — S63609A Unspecified sprain of unspecified thumb, initial encounter: Secondary | ICD-10-CM

## 2024-01-27 ENCOUNTER — Telehealth: Payer: Self-pay

## 2024-01-27 NOTE — Telephone Encounter (Signed)
 Tried to call patient. No answer. LMOM that Zachary Fischer wants him to see Dr.Xu for his knee pain. He has already had an MRI. Ask him to call back to schedule.

## 2024-02-20 ENCOUNTER — Telehealth: Payer: Self-pay | Admitting: Orthopaedic Surgery

## 2024-02-20 NOTE — Telephone Encounter (Signed)
 Called patient back. Scheduled to see Dr.Xu

## 2024-02-20 NOTE — Telephone Encounter (Signed)
 Patient called and said he was returning your call about scheduling. CB#[phone]

## 2024-03-03 ENCOUNTER — Ambulatory Visit (INDEPENDENT_AMBULATORY_CARE_PROVIDER_SITE_OTHER): Admitting: Orthopaedic Surgery

## 2024-03-03 ENCOUNTER — Encounter: Payer: Self-pay | Admitting: Orthopaedic Surgery

## 2024-03-03 DIAGNOSIS — M25562 Pain in left knee: Secondary | ICD-10-CM

## 2024-03-03 DIAGNOSIS — M25561 Pain in right knee: Secondary | ICD-10-CM | POA: Diagnosis not present

## 2024-03-03 DIAGNOSIS — G8929 Other chronic pain: Secondary | ICD-10-CM | POA: Diagnosis not present

## 2024-03-03 MED ORDER — METHYLPREDNISOLONE ACETATE 40 MG/ML IJ SUSP
40.0000 mg | INTRAMUSCULAR | Status: AC | PRN
  Administered 2024-03-03: 40 mg via INTRA_ARTICULAR

## 2024-03-03 MED ORDER — LIDOCAINE HCL 1 % IJ SOLN
2.0000 mL | INTRAMUSCULAR | Status: AC | PRN
  Administered 2024-03-03: 2 mL

## 2024-03-03 MED ORDER — BUPIVACAINE HCL 0.5 % IJ SOLN
2.0000 mL | INTRAMUSCULAR | Status: AC | PRN
  Administered 2024-03-03: 2 mL via INTRA_ARTICULAR

## 2024-03-03 NOTE — Progress Notes (Signed)
 Office Visit Note   Patient: Zachary Fischer           Date of Birth: 01-10-1981           MRN: [id number] Visit Date: 03/03/2024              Requested by: Austine Lefort, MD [address] [place],  [place] [address] PCP: Austine Lefort, MD   Assessment & Plan: Visit Diagnoses:  1. Chronic pain of both knees     Plan: History of Present Illness Zachary Fischer is a 43 year old male who presents with bilateral knee pain. He was referred by Norma Beckers for evaluation of knee pain.  He experiences bilateral knee pain throughout both knees, worsened by standing and walking, with pain levels reaching 7 to 8 out of 10. The pain occurs a couple of times a week and is managed with Advil  and Tylenol . The knees produce loud, painful popping sounds. In February, the pain became severe during a run, preventing completion. An MRI of the right knee and imaging of the left knee have been performed. There is no swelling in the knees.  Physical Exam MUSCULOSKELETAL: Knee range of motion normal bilaterally. Knee strength normal bilaterally. No knee effusion bilaterally. Patellar tracking normal bilaterally. Knee ligaments stable bilaterally.  Results RADIOLOGY Right knee MRI: Synovitis in the patellofemoral compartment  Assessment and Plan Bilateral knee pain with synovitis Chronic bilateral knee pain. MRI shows no meniscal tear or structural damage. Good range of motion, strength, and stability observed. - Administer bilateral cortisone injections in knees. - Refer to physical therapy for knee function improvement and pain management. - Reassess in 4-6 weeks to evaluate treatment response.  Follow-Up Instructions: No follow-ups on file.   Orders:  Orders Placed This Encounter  Procedures   Ambulatory referral to Physical Therapy   No orders of the defined types were placed in this encounter.     Procedures: Large Joint Inj: bilateral knee on 03/03/2024 8:10 PM Indications:  pain Details: 22 G needle  Arthrogram: No  Medications (Right): 2 mL lidocaine  1 %; 2 mL bupivacaine 0.5 %; 40 mg methylPREDNISolone acetate 40 MG/ML Medications (Left): 2 mL lidocaine  1 %; 2 mL bupivacaine 0.5 %; 40 mg methylPREDNISolone acetate 40 MG/ML Outcome: tolerated well, no immediate complications Patient was prepped and draped in the usual sterile fashion.       Clinical Data: No additional findings.   Subjective: Chief Complaint  Patient presents with   Right Knee - Pain   Left Knee - Pain    HPI  Review of Systems  Constitutional: Negative.   HENT: Negative.    Eyes: Negative.   Respiratory: Negative.    Cardiovascular: Negative.   Gastrointestinal: Negative.   Endocrine: Negative.   Genitourinary: Negative.   Skin: Negative.   Allergic/Immunologic: Negative.   Neurological: Negative.   Hematological: Negative.   Psychiatric/Behavioral: Negative.    All other systems reviewed and are negative.    Objective: Vital Signs: There were no vitals taken for this visit.  Physical Exam Vitals and nursing note reviewed.  Constitutional:      Appearance: He is well-developed.  HENT:     Head: Normocephalic and atraumatic.  Eyes:     Pupils: Pupils are equal, round, and reactive to light.  Pulmonary:     Effort: Pulmonary effort is normal.  Abdominal:     Palpations: Abdomen is soft.  Musculoskeletal:  General: Normal range of motion.     Cervical back: Neck supple.  Skin:    General: Skin is warm.  Neurological:     Mental Status: He is alert and oriented to person, place, and time.  Psychiatric:        Behavior: Behavior normal.        Thought Content: Thought content normal.        Judgment: Judgment normal.     Ortho Exam  Specialty Comments:  No specialty comments available.  Imaging: No results found.   PMFS History: Patient Active Problem List   Diagnosis Date Noted   Complete tear of ligament of thumb 12/10/2023    Left thumb sprain 11/26/2023   Pain in right knee 11/08/2023   Esophageal obstruction 05/13/2019   Routine general medical examination at a health care facility 05/29/2013   Past Medical History:  Diagnosis Date   Allergy     Family History  Problem Relation Age of Onset   Cancer Mother        stage 4 breast cancer   Diabetes Mother    Stroke Maternal Grandmother    Heart disease Maternal Grandmother    Kidney disease Maternal Grandmother    Hypertension Maternal Grandmother    Diabetes Maternal Grandmother    Cancer Maternal Grandfather        colon   Hypertension Paternal Grandmother    Hypertension Paternal Grandfather    Cancer Paternal Grandfather        colon cancer   Diabetes Father    Healthy Brother    Healthy Sister     Past Surgical History:  Procedure Laterality Date   ESOPHAGOGASTRODUODENOSCOPY (EGD) WITH PROPOFOL  N/A 05/13/2019   Procedure: ESOPHAGOGASTRODUODENOSCOPY (EGD) WITH PROPOFOL ;  Surgeon: Luke Salaam, MD;  Location: [facility] ENDOSCOPY;  Service: Gastroenterology;  Laterality: N/A;   NECK SURGERY     08/19 lipoma    Social History   Occupational History   Not on file  Tobacco Use   Smoking status: Never   Smokeless tobacco: Never  Vaping Use   Vaping status: Never Used  Substance and Sexual Activity   Alcohol use: No   Drug use: No   Sexual activity: Not on file

## 2024-03-23 ENCOUNTER — Ambulatory Visit (INDEPENDENT_AMBULATORY_CARE_PROVIDER_SITE_OTHER): Admitting: Physical Therapy

## 2024-03-23 ENCOUNTER — Encounter: Payer: Self-pay | Admitting: Physical Therapy

## 2024-03-23 DIAGNOSIS — M6281 Muscle weakness (generalized): Secondary | ICD-10-CM

## 2024-03-23 DIAGNOSIS — R262 Difficulty in walking, not elsewhere classified: Secondary | ICD-10-CM | POA: Diagnosis not present

## 2024-03-23 DIAGNOSIS — M25561 Pain in right knee: Secondary | ICD-10-CM

## 2024-03-23 DIAGNOSIS — M25562 Pain in left knee: Secondary | ICD-10-CM | POA: Diagnosis not present

## 2024-03-23 DIAGNOSIS — R600 Localized edema: Secondary | ICD-10-CM

## 2024-03-23 DIAGNOSIS — G8929 Other chronic pain: Secondary | ICD-10-CM

## 2024-03-23 NOTE — Therapy (Signed)
 OUTPATIENT PHYSICAL THERAPY LOWER EXTREMITY EVALUATION   Patient Name: Zachary Fischer MRN: [id number] DOB:29-Dec-1980, 43 y.o., male Today's Date: 03/23/2024  END OF SESSION:  PT End of Session - 03/23/24 0818     Visit Number 1    Number of Visits 16    Date for PT Re-Evaluation 05/22/24    PT Start Time 0815    PT Stop Time 0910    PT Time Calculation (min) 55 min    Activity Tolerance Patient tolerated treatment well    Behavior During Therapy [facility] for tasks assessed/performed          Past Medical History:  Diagnosis Date   Allergy    Past Surgical History:  Procedure Laterality Date   ESOPHAGOGASTRODUODENOSCOPY (EGD) WITH PROPOFOL  N/A 05/13/2019   Procedure: ESOPHAGOGASTRODUODENOSCOPY (EGD) WITH PROPOFOL ;  Surgeon: Therisa Bi, MD;  Location: [facility] ENDOSCOPY;  Service: Gastroenterology;  Laterality: N/A;   NECK SURGERY     08/19 lipoma    Patient Active Problem List   Diagnosis Date Noted   Complete tear of ligament of thumb 12/10/2023   Left thumb sprain 11/26/2023   Pain in right knee 11/08/2023   Esophageal obstruction 05/13/2019   Routine general medical examination at a health care facility 05/29/2013    PCP: Duanne Butler DASEN, MD   REFERRING PROVIDER: Jerri Kay HERO, MD   REFERRING DIAG:  Diagnosis  M25.561,M25.562,G89.29 (ICD-10-CM) - Chronic pain of both knees    THERAPY DIAG:  Bilateral chronic knee pain  Muscle weakness (generalized)  Difficulty in walking, not elsewhere classified  Rationale for Evaluation and Treatment: Rehabilitation  ONSET DATE: Pt reporting pain has been ongoing for years and gradually wosened   SUBJECTIVE:   SUBJECTIVE STATEMENT: Pt arriving to therapy reporting in February where he was doing an event sprint, drag, carry for a Set designer. Pt stating he felt a sharp pain running down both knees when trying to run lateral running.   PERTINENT HISTORY: Neck surgery -tumor removal    PAIN:  NPRS scale:  6-7/10 Pain location: bilateral knees Pain description: achy Aggravating factors: bending, long distances walking, stairs, squats, running Relieving factors: Tylenol , Advil , Icy-hot   PRECAUTIONS: None   WEIGHT BEARING RESTRICTIONS: No  FALLS:  Has patient fallen in last 6 months? 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 Possible scarring or localized nodular synovitis in the prefemoral fat without associated osseous erosion. 2. No other significant osseous findings are identified. The menisci, cruciate and collateral ligaments are intact. 3. No acute osseous findings.  PATIENT SURVEYS:  Patient-Specific Activity Scoring Scheme  0 represents "unable to perform." 10 represents "able to perform at prior level. 0 1 2 3 4 5 6 7 8 9  10 (Date and Score)   Activity Eval  03/23/24    1. running  4    2. Walking up flights of steps  5    3. Marching for [employer] purposes 5   4.    5.    Score 4.6    Total score = sum of the activity scores/number of  activities Minimum detectable change (90%CI) for average score = 2 points Minimum detectable change (90%CI) for single activity score = 3 points  COGNITION: Overall cognitive status: WFL    SENSATION: WFL  EDEMA:  Circumferential: Rt knee: 40 centimeters, Left knee: 39.5 centimeters   LOWER EXTREMITY ROM:   ROM Right Eval 03/23/24 Supine active Left Eval 03/23/24 Supine  active  Hip flexion    Hip extension    Hip abduction    Hip adduction    Hip internal rotation    Hip external  rotation    Knee flexion 124 c pain 126  c pain  Knee extension 0 0  Ankle dorsiflexion    Ankle plantarflexion        Hamstring supine, opposite knee bent 55 52   (Blank rows = not tested)  LOWER EXTREMITY MMT:  MMT Right Eval 03/23/24  Left Eval 03/23/24  Hip flexion    Hip extension    Hip abduction    Hip adduction    Hip internal rotation    Hip external rotation    Knee flexion 59.5 lbs 65.2 lbs  Knee extension 70.7 lbs 65.9 lbs  Ankle dorsiflexion    Ankle plantarflexion    Ankle inversion    Ankle eversion     (Blank rows = not tested)    FUNCTIONAL TESTS:  03/23/24:  5 time sit to stand: 19.30 seconds no UE support  GAIT: Distance walked: clinic distances Assistive device utilized: None Level of assistance: Complete Independence Comments: antalgic, wide BOS, decreased terminal knee extension                                                                                                                                                                        TODAY'S TREATMENT                                                                          DATE: 03/23/24 Therex: HEP instruction/performance c cues for techniques, handout provided.  Trial set performed of each for comprehension and symptom assessment.  See below for exercise list Pt instructed to use his [employer] access to ride the recumbent bike 3-4 x / week for up to 10 minutes beginning with light resistance and progressing as tolerated in pain free motion Recumbent bike: level 2 x 3 minutes   PATIENT EDUCATION:  Education details: HEP, POC Person educated: Patient Education method: Programmer, multimedia, Demonstration, Verbal cues, and Handouts Education  comprehension: verbalized understanding, returned demonstration, and verbal cues required  HOME EXERCISE PROGRAM: Access Code: [email or web address] URL: [email or web address] Date: 03/23/2024 Prepared by: Delon Lunger  Exercises - Supine  Bridge  - 2 x daily - 7 x weekly - 2 sets - 10 reps - Supine Knee Extension Strengthening  - 2 x daily - 7 x weekly - 2 sets - 10 reps - 5 seconds hold - Seated Hamstring Stretch  - 2 x daily - 7 x weekly - 3 reps - 30 seconds hold - Sitting Knee Extension with Resistance  - 2 x daily - 7 x weekly - 2 sets - 10 reps  ASSESSMENT:  CLINICAL IMPRESSION: Patient is a 43 y.o. who comes to clinic with complaints of bilateral pain. Pt presenting today with mobility, strength and movement coordination deficits that impair their ability to perform usual daily and recreational functional activities without increase difficulty/symptoms at this time.  Pt with pin point bilateral knee pain across joint line with end range of flexion and extension. 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 Pt was instructed to follow up with Dr Jerri for activity limitations while pt is attending therapy. Patient to benefit from skilled PT services to address impairments and limitations to improve to previous level of function without restriction secondary to condition.   OBJECTIVE IMPAIRMENTS: difficulty walking, decreased ROM, decreased strength, increased edema, impaired flexibility, and pain.   ACTIVITY LIMITATIONS: lifting, bending, standing, squatting, and stairs  PARTICIPATION LIMITATIONS: community activity and occupation  PERSONAL FACTORS: 1 comorbidity: see PMH are also affecting patient's functional outcome.   REHAB POTENTIAL: Excellent  CLINICAL DECISION MAKING: Stable/uncomplicated  EVALUATION COMPLEXITY: Low   GOALS: Goals reviewed with patient? Yes  SHORT TERM GOALS: (target date for Short term goals are 3 weeks 04/13/2024)   1.  Patient will demonstrate independent use of home exercise program to maintain progress from in clinic treatments.  Goal status: New  LONG TERM GOALS: (target dates for all long term goals are 8 weeks  05/22/2024 )   1. Patient will  demonstrate/report pain at worst less than or equal to 2/10 to facilitate minimal limitation in daily activity secondary to pain symptoms.  Goal status: New   2. Patient will demonstrate independent use of home exercise program to facilitate ability to maintain/progress functional gains from skilled physical therapy services.  Goal status: New   3. Patient will demonstrate Patient specific functional scale avg > or = 6.6 to indicate reduced disability due to condition.   Goal status: New   4.  Patient will demonstrate bil LE MMT by >/= 10 lbs using HHD with knee flexion and extension throughout to faciltiate usual transfers, stairs, squatting at PLOF for daily life.   Goal status: New   5.  Patient will demonstrate up and down 1 flight of stairs with single hand rail with no pain noted in bilateral knees.  Goal status: New   6.  Pt will be able to perform full squat using correct body mechanics with pain of </= 2/10.  Goal status: New      PLAN:  PT FREQUENCY: 1-2x/week  PT DURATION: 10 weeks  PLANNED INTERVENTIONS: Can include 02853- PT Re-evaluation, 97110-Therapeutic exercises, 97530- Therapeutic activity, [id number]- Neuromuscular re-education, 97535- Self Care, 97140- Manual therapy, [id number]- Gait training, [phone]- Orthotic Fit/training, [phone]- Canalith repositioning, [id number]- Aquatic Therapy, [phone]- Electrical stimulation (unattended), [id number] Physical performance testing, 97016- Vasopneumatic device, [id number]- Ultrasound, [id number]- Traction (mechanical), [id number]- Ionotophoresis 4mg /ml Dexamethasone,  [id number] - Needle  insertion w/o injection 1 or 2 muscles, 20561 - Needle insertion w/o injection 3 or more muscles.   Patient/Family education, Balance training, Stair training, Taping, Dry Needling, Joint mobilization, Joint manipulation, Spinal manipulation, Spinal mobilization, Scar mobilization, Vestibular training, Visual/preceptual remediation/compensation, DME instructions, Cryotherapy, and Moist  heat.  All performed as medically necessary.  All included unless contraindicated  PLAN FOR NEXT SESSION: Review HEP knowledge/results. Knee ROM, quad strengthening     Delon JONELLE Lunger, PT, MPT 03/23/2024, 9:21 AM

## 2024-04-09 NOTE — Therapy (Signed)
 OUTPATIENT PHYSICAL THERAPY TREATMENT   Patient Name: Zachary Fischer MRN: [id number] DOB:1980/12/17, 43 y.o., male Today's Date: 04/10/2024  END OF SESSION:  PT End of Session - 04/10/24 0812     Visit Number 2    Number of Visits 16    Date for PT Re-Evaluation 05/22/24    Authorization Type BCBS    PT Start Time 0812    PT Stop Time 0845    PT Time Calculation (min) 33 min    Activity Tolerance Patient tolerated treatment well    Behavior During Therapy [facility] for tasks assessed/performed           Past Medical History:  Diagnosis Date   Allergy    Past Surgical History:  Procedure Laterality Date   ESOPHAGOGASTRODUODENOSCOPY (EGD) WITH PROPOFOL  N/A 05/13/2019   Procedure: ESOPHAGOGASTRODUODENOSCOPY (EGD) WITH PROPOFOL ;  Surgeon: Therisa Bi, MD;  Location: [facility] ENDOSCOPY;  Service: Gastroenterology;  Laterality: N/A;   NECK SURGERY     08/19 lipoma    Patient Active Problem List   Diagnosis Date Noted   Complete tear of ligament of thumb 12/10/2023   Left thumb sprain 11/26/2023   Pain in right knee 11/08/2023   Esophageal obstruction 05/13/2019   Routine general medical examination at a health care facility 05/29/2013    PCP: Duanne Butler DASEN, MD   REFERRING PROVIDER: Jerri Kay HERO, MD   REFERRING DIAG:  Diagnosis  M25.561,M25.562,G89.29 (ICD-10-CM) - Chronic pain of both knees    THERAPY DIAG:  Bilateral chronic knee pain  Muscle weakness (generalized)  Difficulty in walking, not elsewhere classified  Localized edema  Rationale for Evaluation and Treatment: Rehabilitation  ONSET DATE: Pt reporting pain has been ongoing for years and gradually wosened   SUBJECTIVE:   SUBJECTIVE STATEMENT: Pt indicated feeling about the same overall. Pt indicated the Rt may be doing better but the Lt still troublesome.  Reported symptoms in anterior knee and noticing popping as well.   PERTINENT HISTORY: Neck surgery -tumor removal    PAIN:  NPRS scale:  at worst in last few days 5-6/10.  Lt knee more.  Pain location: bilateral knees Pain description: achy Aggravating factors: walking up stairs, transfers after sitting prolonged.  Relieving factors: Tylenol , Advil , Icy-hot   PRECAUTIONS: None   WEIGHT BEARING RESTRICTIONS: No  FALLS:  Has patient fallen in last 6 months? 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 Possible scarring or localized nodular synovitis in the prefemoral fat without associated osseous erosion. 2. No other significant osseous findings are identified. The menisci, cruciate and collateral ligaments are intact. 3. No acute osseous findings.  PATIENT SURVEYS:  Patient-Specific Activity Scoring Scheme  0 represents "unable to perform." 10 represents "able to perform at prior level. 0 1 2 3 4 5 6 7 8 9  10 (Date and Score)   Activity Eval  03/23/24    1. running  4    2. Walking up flights of steps  5    3. Marching for [employer] purposes 5   4.    5.    Score 4.6  Total score = sum of the activity scores/number of activities Minimum detectable change (90%CI) for average score = 2 points Minimum detectable change (90%CI) for single activity score = 3 points  COGNITION: 03/23/2024 Overall cognitive status: WFL    SENSATION: 03/23/2024 [facility]  EDEMA:  03/23/2024 Circumferential: Rt knee: 40 centimeters, Left knee: 39.5 centimeters   LOWER EXTREMITY ROM:   ROM Right Eval 03/23/24 Supine active Left Eval 03/23/24 Supine  active  Hip flexion    Hip extension    Hip abduction    Hip  adduction    Hip internal rotation    Hip external rotation    Knee flexion 124 c pain 126  c pain  Knee extension 0 0  Ankle dorsiflexion    Ankle plantarflexion        Hamstring supine, opposite knee bent 55 52   (Blank rows = not tested)  LOWER EXTREMITY MMT:  MMT Right Eval 03/23/24  Left Eval 03/23/24  Hip flexion    Hip extension    Hip abduction    Hip adduction    Hip internal rotation    Hip external rotation    Knee flexion 59.5 lbs 65.2 lbs  Knee extension 70.7 lbs 65.9 lbs  Ankle dorsiflexion    Ankle plantarflexion    Ankle inversion    Ankle eversion     (Blank rows = not tested)    FUNCTIONAL TESTS:  03/23/24:  5 time sit to stand: 19.30 seconds no UE support  GAIT: 03/23/2024 Distance walked: clinic distances Assistive device utilized: None Level of assistance: Complete Independence Comments: antalgic, wide BOS, decreased terminal knee extension                                                                                                                                                                        TODAY'S TREATMENT                                                                          DATE: 04/10/2024 Therex: Recumbent bike lvl 3 5 mins for ROM warm up Seated quad set 5 sec hold x 1 with cues for activation in home use.  Seated quad set with SLR x 10 bilateral  Leg press double leg 15 x 81 lbs, single leg only 31 lbs x 15 bilateral (cues for adjusting foot positioning to avoid knee pain). S Seated isometric extension/flexion 5 sec hold with one leg pushing on the other.  X 10     TODAY'S TREATMENT                                                                          DATE:03/23/24 Therex: HEP instruction/performance c cues for techniques, handout provided.  Trial set performed of each for comprehension and symptom assessment.  See below for exercise list Pt instructed to use his [employer] access to ride the recumbent bike 3-4  x / week for up to 10 minutes beginning with light resistance and progressing as tolerated in pain free motion Recumbent bike: level 2 x 3 minutes   PATIENT EDUCATION:  Education details: HEP, POC Person educated: Patient Education method: Programmer, multimedia, Demonstration, Verbal cues, and Handouts Education comprehension: verbalized understanding, returned demonstration, and verbal cues required  HOME EXERCISE PROGRAM: Access Code: [email or web address] URL: [email or web address] Date: 04/10/2024 Prepared by: Ozell Silvan  Exercises - Supine Bridge  - 2 x daily - 7 x weekly - 2 sets - 10 reps - Supine Knee Extension Strengthening  - 2 x daily - 7 x weekly - 2 sets - 10 reps - 5 seconds hold - Seated Hamstring Stretch  - 2 x daily - 7 x weekly - 3 reps - 30 seconds hold - Sitting Knee Extension with Resistance  - 2 x daily - 7 x weekly - 2 sets - 10 reps - Seated Quad Set  - 3-5 x daily - 7 x weekly - 1 sets - 10 reps - 5 hold - Seated Straight Leg Heel Taps  - 1-2 x daily - 7 x weekly - 3 sets - 10 reps  ASSESSMENT:  CLINICAL IMPRESSION: Pt to benefit from continued strengthening to help offload knee joint for functional movements.  WB loading most obvious aggravating factor noted to this point.  Cues given for additional HEP as well as mobility with sitting to prevent troubles.     OBJECTIVE IMPAIRMENTS: difficulty walking, decreased ROM, decreased strength, increased edema, impaired flexibility, and pain.   ACTIVITY LIMITATIONS: lifting, bending, standing, squatting, and stairs  PARTICIPATION LIMITATIONS: community activity and occupation  PERSONAL FACTORS: 1 comorbidity: see PMH are also affecting patient's functional outcome.   REHAB POTENTIAL: Excellent  CLINICAL DECISION MAKING: Stable/uncomplicated  EVALUATION COMPLEXITY: Low   GOALS: Goals reviewed with patient? Yes  SHORT TERM GOALS: (target date for Short term goals are 3 weeks 04/13/2024)   1.  Patient will  demonstrate independent use of home exercise program to maintain progress from in clinic treatments.  Goal status: Met   LONG TERM GOALS: (target dates for all long term goals are 8 weeks  05/22/2024 )   1. Patient will demonstrate/report pain at worst less than or equal to 2/10 to facilitate minimal limitation in daily activity secondary to pain symptoms.  Goal status: New   2. Patient will demonstrate independent use of home exercise program to facilitate ability to maintain/progress functional gains from skilled physical therapy services.  Goal status: New   3. Patient will demonstrate Patient specific functional scale avg > or = 6.6 to indicate reduced disability due to condition.   Goal status: New   4.  Patient will demonstrate bil LE MMT by >/= 10 lbs using HHD with knee flexion and  extension throughout to faciltiate usual transfers, stairs, squatting at [facility] for daily life.   Goal status: New   5.  Patient will demonstrate up and down 1 flight of stairs with single hand rail with no pain noted in bilateral knees.  Goal status: New   6.  Pt will be able to perform full squat using correct body mechanics with pain of </= 2/10.  Goal status: New      PLAN:  PT FREQUENCY: 1-2x/week  PT DURATION: 10 weeks  PLANNED INTERVENTIONS: Can include 02853- PT Re-evaluation, 97110-Therapeutic exercises, 97530- Therapeutic activity, [id number]- Neuromuscular re-education, 97535- Self Care, 97140- Manual therapy, [phone]- Gait training, [phone]- Orthotic Fit/training, [phone]- Canalith repositioning, [id number]- Aquatic Therapy, [phone]- Electrical stimulation (unattended), [id number] Physical performance testing, 97016- Vasopneumatic device, [id number]- Ultrasound, [id number]- Traction (mechanical), [id number]- Ionotophoresis 4mg /ml Dexamethasone,  20560 - Needle insertion w/o injection 1 or 2 muscles, 20561 - Needle insertion w/o injection 3 or more muscles.   Patient/Family education, Balance training, Stair training,  Taping, Dry Needling, Joint mobilization, Joint manipulation, Spinal manipulation, Spinal mobilization, Scar mobilization, Vestibular training, Visual/preceptual remediation/compensation, DME instructions, Cryotherapy, and Moist heat.  All performed as medically necessary.  All included unless contraindicated  PLAN FOR NEXT SESSION:  Continue strengthening program.    Ozell Silvan, PT, DPT, OCS, ATC 04/10/24  8:46 AM

## 2024-04-10 ENCOUNTER — Ambulatory Visit (INDEPENDENT_AMBULATORY_CARE_PROVIDER_SITE_OTHER): Admitting: Rehabilitative and Restorative Service Providers"

## 2024-04-10 ENCOUNTER — Encounter: Payer: Self-pay | Admitting: Rehabilitative and Restorative Service Providers"

## 2024-04-10 DIAGNOSIS — G8929 Other chronic pain: Secondary | ICD-10-CM

## 2024-04-10 DIAGNOSIS — R600 Localized edema: Secondary | ICD-10-CM | POA: Diagnosis not present

## 2024-04-10 DIAGNOSIS — M6281 Muscle weakness (generalized): Secondary | ICD-10-CM

## 2024-04-10 DIAGNOSIS — M25561 Pain in right knee: Secondary | ICD-10-CM | POA: Diagnosis not present

## 2024-04-10 DIAGNOSIS — M25562 Pain in left knee: Secondary | ICD-10-CM

## 2024-04-10 DIAGNOSIS — R262 Difficulty in walking, not elsewhere classified: Secondary | ICD-10-CM

## 2024-04-14 ENCOUNTER — Ambulatory Visit (INDEPENDENT_AMBULATORY_CARE_PROVIDER_SITE_OTHER): Admitting: Physical Therapy

## 2024-04-14 ENCOUNTER — Encounter: Payer: Self-pay | Admitting: Physical Therapy

## 2024-04-14 DIAGNOSIS — M6281 Muscle weakness (generalized): Secondary | ICD-10-CM | POA: Diagnosis not present

## 2024-04-14 DIAGNOSIS — R262 Difficulty in walking, not elsewhere classified: Secondary | ICD-10-CM

## 2024-04-14 DIAGNOSIS — M25561 Pain in right knee: Secondary | ICD-10-CM | POA: Diagnosis not present

## 2024-04-14 DIAGNOSIS — R600 Localized edema: Secondary | ICD-10-CM

## 2024-04-14 DIAGNOSIS — M25562 Pain in left knee: Secondary | ICD-10-CM

## 2024-04-14 DIAGNOSIS — G8929 Other chronic pain: Secondary | ICD-10-CM

## 2024-04-14 NOTE — Therapy (Signed)
 OUTPATIENT PHYSICAL THERAPY TREATMENT   Patient Name: Zachary Fischer MRN: [id number] DOB:[birth year], 43 y.o., male Today's Date: 04/14/2024  END OF SESSION:  PT End of Session - 04/14/24 0839     Visit Number 3    Number of Visits 16    Date for PT Re-Evaluation 05/22/24    Authorization Type BCBS    PT Start Time 0815    PT Stop Time 0845    PT Time Calculation (min) 30 min    Activity Tolerance Patient tolerated treatment well    Behavior During Therapy [facility] for tasks assessed/performed            Past Medical History:  Diagnosis Date   Allergy    Past Surgical History:  Procedure Laterality Date   ESOPHAGOGASTRODUODENOSCOPY (EGD) WITH PROPOFOL  N/A 05/13/2019   Procedure: ESOPHAGOGASTRODUODENOSCOPY (EGD) WITH PROPOFOL ;  Surgeon: Therisa Bi, MD;  Location: [facility] ENDOSCOPY;  Service: Gastroenterology;  Laterality: N/A;   NECK SURGERY     08/19 lipoma    Patient Active Problem List   Diagnosis Date Noted   Complete tear of ligament of thumb 12/10/2023   Left thumb sprain 11/26/2023   Pain in right knee 11/08/2023   Esophageal obstruction 05/13/2019   Routine general medical examination at a health care facility 05/29/2013    PCP: Duanne Butler DASEN, MD   REFERRING PROVIDER: Jerri Kay HERO, MD   REFERRING DIAG:  Diagnosis  M25.561,M25.562,G89.29 (ICD-10-CM) - Chronic pain of both knees    THERAPY DIAG:  Bilateral chronic knee pain  Muscle weakness (generalized)  Difficulty in walking, not elsewhere classified  Localized edema  Rationale for Evaluation and Treatment: Rehabilitation  ONSET DATE: Pt reporting pain has been ongoing for years and gradually wosened   SUBJECTIVE:   SUBJECTIVE STATEMENT: Pt stating he has noticed a new pain in his Rt hip. Pt reporting no pain at rest. Pt did state yesterday his left knee pain was 6/10 and Tylenol  helped some until it wore off and his pain returned.     PERTINENT HISTORY: Neck surgery -tumor removal     PAIN:  NPRS scale: see above, left knee worse Pain location: bilateral knees Pain description: achy Aggravating factors: walking up stairs, transfers after sitting prolonged.  Relieving factors: Tylenol , Advil , Icy-hot   PRECAUTIONS: None   WEIGHT BEARING RESTRICTIONS: No  FALLS:  Has patient fallen in last 6 months? 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 Possible scarring or localized nodular synovitis in the prefemoral fat without associated osseous erosion. 2. No other significant osseous findings are identified. The menisci, cruciate and collateral ligaments are intact. 3. No acute osseous findings.  PATIENT SURVEYS:  Patient-Specific Activity Scoring Scheme  0 represents "unable to perform." 10 represents "able to perform at prior level. 0 1 2 3 4 5 6 7 8 9  10 (Date and Score)   Activity Eval  03/23/24  04/14/24  1. running  4 4   2. Walking up flights of steps  5 8   3. Marching for [employer] purposes 5 3  4.    5.  Score 4.6 5   Total score = sum of the activity scores/number of activities Minimum detectable change (90%CI) for average score = 2 points Minimum detectable change (90%CI) for single activity score = 3 points  COGNITION: 03/23/2024 Overall cognitive status: WFL    SENSATION: 03/23/2024 [facility]  EDEMA:  03/23/2024 Circumferential: Rt knee: 40 centimeters, Left knee: 39.5 centimeters   LOWER EXTREMITY ROM:   ROM Right Eval 03/23/24 Supine active Left Eval 03/23/24 Supine  active  Hip flexion    Hip extension    Hip  abduction    Hip adduction    Hip internal rotation    Hip external rotation    Knee flexion 124 c pain 126  c pain  Knee extension 0 0  Ankle dorsiflexion    Ankle plantarflexion        Hamstring supine, opposite knee bent 55 52   (Blank rows = not tested)  LOWER EXTREMITY MMT:  MMT Right Eval 03/23/24  Left Eval 03/23/24  Hip flexion    Hip extension    Hip abduction    Hip adduction    Hip internal rotation    Hip external rotation    Knee flexion 59.5 lbs 65.2 lbs  Knee extension 70.7 lbs 65.9 lbs  Ankle dorsiflexion    Ankle plantarflexion    Ankle inversion    Ankle eversion     (Blank rows = not tested)    FUNCTIONAL TESTS:  03/23/24:  5 time sit to stand: 19.30 seconds no UE support  GAIT: 03/23/2024 Distance walked: clinic distances Assistive device utilized: None Level of assistance: Complete Independence Comments: antalgic, wide BOS, decreased terminal knee extension                                                                                                                                                                        TODAY'S TREATMENT                                                                          DATE:04/14/24 Therex: Recumbent bike: Level 3 x 5 minutes LAQ: c 3# 2 x 10 holding 3 sec bil  Seated SLR: 2 x 10 bil  Standing calf stretch: slant board x 3 holding 30 sec TherActivites:  Double Leg Press: 87# 3 x 10 slow eccentrics preventing TKE with sled moved out so knees are bending to 70 degrees, single leg press: 37# 2 x 10  TODAY'S TREATMENT                                                                          DATE: 04/10/2024 Therex: Recumbent bike lvl 3 5 mins for ROM warm up Seated quad set 5 sec hold x 1 with cues for activation in home use.  Seated quad set with SLR x 10 bilateral  Leg press double leg 15 x 81 lbs, single leg only 31 lbs x 15 bilateral (cues for adjusting foot positioning to avoid knee pain).  S Seated isometric extension/flexion 5 sec hold with one leg pushing on the other. X 10     TODAY'S TREATMENT                                                                          DATE:03/23/24 Therex: HEP instruction/performance c cues for techniques, handout provided.  Trial set performed of each for comprehension and symptom assessment.  See below for exercise list Pt instructed to use his [employer] access to ride the recumbent bike 3-4 x / week for up to 10 minutes beginning with light resistance and progressing as tolerated in pain free motion Recumbent bike: level 2 x 3 minutes   PATIENT EDUCATION:  Education details: HEP, POC Person educated: Patient Education method: Programmer, multimedia, Demonstration, Verbal cues, and Handouts Education comprehension: verbalized understanding, returned demonstration, and verbal cues required  HOME EXERCISE PROGRAM: Access Code: [email or web address] URL: [email or web address] Date: 04/10/2024 Prepared by: Ozell Silvan  Exercises - Supine Bridge  - 2 x daily - 7 x weekly - 2 sets - 10 reps - Supine Knee Extension Strengthening  - 2 x daily - 7 x weekly - 2 sets - 10 reps - 5 seconds hold - Seated Hamstring Stretch  - 2 x daily - 7 x weekly - 3 reps - 30 seconds hold - Sitting Knee Extension with Resistance  - 2 x daily - 7 x weekly - 2 sets - 10 reps - Seated Quad Set  - 3-5 x daily - 7 x weekly - 1 sets - 10 reps - 5 hold - Seated Straight Leg Heel Taps  - 1-2 x daily - 7 x weekly - 3 sets - 10 reps  ASSESSMENT:  CLINICAL IMPRESSION: Pt arriving today almost 15 minutes late for PT appointment due to traffic. Pt's PSFS has increased since his initial evaluation, however goal is not met. Treatment focusing on bil LE strengthening in both open and closed kinetic chains in painfree ROM. Recommending continued skilled PT.    OBJECTIVE IMPAIRMENTS: difficulty walking, decreased ROM, decreased strength, increased edema, impaired flexibility,  and pain.   ACTIVITY LIMITATIONS: lifting, bending, standing, squatting, and stairs  PARTICIPATION LIMITATIONS: community activity and occupation  PERSONAL FACTORS: 1 comorbidity: see PMH are also affecting patient's functional outcome.   REHAB POTENTIAL: Excellent  CLINICAL DECISION MAKING: Stable/uncomplicated  EVALUATION COMPLEXITY: Low   GOALS: Goals reviewed with patient? Yes  SHORT TERM GOALS: (  target date for Short term goals are 3 weeks 04/13/2024)   1.  Patient will demonstrate independent use of home exercise program to maintain progress from in clinic treatments.  Goal status: Met   LONG TERM GOALS: (target dates for all long term goals are 8 weeks  05/22/2024 )   1. Patient will demonstrate/report pain at worst less than or equal to 2/10 to facilitate minimal limitation in daily activity secondary to pain symptoms.  Goal status: New   2. Patient will demonstrate independent use of home exercise program to facilitate ability to maintain/progress functional gains from skilled physical therapy services.  Goal status: New   3. Patient will demonstrate Patient specific functional scale avg > or = 6.6 to indicate reduced disability due to condition.   Goal status: New   4.  Patient will demonstrate bil LE MMT by >/= 10 lbs using HHD with knee flexion and extension throughout to faciltiate usual transfers, stairs, squatting at PLOF for daily life.   Goal status: New   5.  Patient will demonstrate up and down 1 flight of stairs with single hand rail with no pain noted in bilateral knees.  Goal status: Met 04/14/24   6.  Pt will be able to perform full squat using correct body mechanics with pain of </= 2/10.  Goal status: New      PLAN:  PT FREQUENCY: 1-2x/week  PT DURATION: 10 weeks  PLANNED INTERVENTIONS: Can include 02853- PT Re-evaluation, 97110-Therapeutic exercises, 97530- Therapeutic activity, [id number]- Neuromuscular re-education, 97535- Self Care, 97140-  Manual therapy, [phone]- Gait training, [phone]- Orthotic Fit/training, [phone]- Canalith repositioning, [id number]- Aquatic Therapy, [phone]- Electrical stimulation (unattended), [id number] Physical performance testing, 97016- Vasopneumatic device, [id number]- Ultrasound, [id number]- Traction (mechanical), [id number]- Ionotophoresis 4mg /ml Dexamethasone,  20560 - Needle insertion w/o injection 1 or 2 muscles, 20561 - Needle insertion w/o injection 3 or more muscles.   Patient/Family education, Balance training, Stair training, Taping, Dry Needling, Joint mobilization, Joint manipulation, Spinal manipulation, Spinal mobilization, Scar mobilization, Vestibular training, Visual/preceptual remediation/compensation, DME instructions, Cryotherapy, and Moist heat.  All performed as medically necessary.  All included unless contraindicated  PLAN FOR NEXT SESSION:  continue strengthening in pain free ROM, progress pt's HEP as appropriate    Delon Lunger, PT, MPT 04/14/24 8:40 AM  04/14/24  8:40 AM

## 2024-04-17 ENCOUNTER — Encounter: Admitting: Physical Therapy

## 2024-04-17 ENCOUNTER — Telehealth: Payer: Self-pay | Admitting: Physical Therapy

## 2024-04-17 NOTE — Telephone Encounter (Signed)
 No-show for today's PT appt. Called and left voicemail message about time/date of next visit.   Josette Rough, PT, DPT 04/17/24 8:22 AM

## 2024-04-21 ENCOUNTER — Encounter: Payer: Self-pay | Admitting: Rehabilitative and Restorative Service Providers"

## 2024-04-21 ENCOUNTER — Ambulatory Visit (INDEPENDENT_AMBULATORY_CARE_PROVIDER_SITE_OTHER): Admitting: Rehabilitative and Restorative Service Providers"

## 2024-04-21 DIAGNOSIS — M6281 Muscle weakness (generalized): Secondary | ICD-10-CM

## 2024-04-21 DIAGNOSIS — M25562 Pain in left knee: Secondary | ICD-10-CM

## 2024-04-21 DIAGNOSIS — R600 Localized edema: Secondary | ICD-10-CM | POA: Diagnosis not present

## 2024-04-21 DIAGNOSIS — R262 Difficulty in walking, not elsewhere classified: Secondary | ICD-10-CM | POA: Diagnosis not present

## 2024-04-21 DIAGNOSIS — M25561 Pain in right knee: Secondary | ICD-10-CM | POA: Diagnosis not present

## 2024-04-21 DIAGNOSIS — G8929 Other chronic pain: Secondary | ICD-10-CM

## 2024-04-21 NOTE — Therapy (Signed)
 OUTPATIENT PHYSICAL THERAPY TREATMENT   Patient Name: Zachary Fischer MRN: [id number] DOB:August 24, 1981, 43 y.o., male Today's Date: 04/21/2024  END OF SESSION:  PT End of Session - 04/21/24 0809     Visit Number 4    Number of Visits 16    Date for PT Re-Evaluation 05/22/24    Authorization Type BCBS    PT Start Time 0805    PT Stop Time 0843    PT Time Calculation (min) 38 min    Activity Tolerance Patient tolerated treatment well    Behavior During Therapy [facility] for tasks assessed/performed             Past Medical History:  Diagnosis Date   Allergy    Past Surgical History:  Procedure Laterality Date   ESOPHAGOGASTRODUODENOSCOPY (EGD) WITH PROPOFOL  N/A 05/13/2019   Procedure: ESOPHAGOGASTRODUODENOSCOPY (EGD) WITH PROPOFOL ;  Surgeon: Therisa Bi, MD;  Location: [facility] ENDOSCOPY;  Service: Gastroenterology;  Laterality: N/A;   NECK SURGERY     08/19 lipoma    Patient Active Problem List   Diagnosis Date Noted   Complete tear of ligament of thumb 12/10/2023   Left thumb sprain 11/26/2023   Pain in right knee 11/08/2023   Esophageal obstruction 05/13/2019   Routine general medical examination at a health care facility 05/29/2013    PCP: Duanne Butler DASEN, MD   REFERRING PROVIDER: Jerri Kay HERO, MD   REFERRING DIAG:  Diagnosis  M25.561,M25.562,G89.29 (ICD-10-CM) - Chronic pain of both knees    THERAPY DIAG:  Bilateral chronic knee pain  Muscle weakness (generalized)  Difficulty in walking, not elsewhere classified  Localized edema  Rationale for Evaluation and Treatment: Rehabilitation  ONSET DATE: Pt reporting pain has been ongoing for years and gradually wosened   SUBJECTIVE:   SUBJECTIVE STATEMENT: Pt indicated doing generally better than previous. Still reported having some symptoms , noted more in Lt knee than Rt over the weekend.     PERTINENT HISTORY: Neck surgery -tumor removal    PAIN:  NPRS scale: up to 3-4/10 Lt knee mainly  Pain  location: bilateral knees Pain description: achy Aggravating factors: walking up stairs, transfers after sitting prolonged.  Relieving factors: Tylenol , Advil , Icy-hot   PRECAUTIONS: None   WEIGHT BEARING RESTRICTIONS: No  FALLS:  Has patient fallen in last 6 months? 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 Possible scarring or localized nodular synovitis in the prefemoral fat without associated osseous erosion. 2. No other significant osseous findings are identified. The menisci, cruciate and collateral ligaments are intact. 3. No acute osseous findings.  PATIENT SURVEYS:  Patient-Specific Activity Scoring Scheme  0 represents "unable to perform." 10 represents "able to perform at prior level. 0 1 2 3 4 5 6 7 8 9  10 (Date and Score)   Activity Eval  03/23/24  04/14/24  1. running  4 4   2. Walking up flights of steps  5 8   3. Marching for [employer] purposes 5 3  4.    5.    Score 4.6 5   Total score = sum of the activity scores/number of  activities Minimum detectable change (90%CI) for average score = 2 points Minimum detectable change (90%CI) for single activity score = 3 points  COGNITION: 03/23/2024 Overall cognitive status: WFL    SENSATION: 03/23/2024 [facility]  EDEMA:  03/23/2024 Circumferential: Rt knee: 40 centimeters, Left knee: 39.5 centimeters   LOWER EXTREMITY ROM:   ROM Right Eval 03/23/24 Supine active Left Eval 03/23/24 Supine  active  Hip flexion    Hip extension    Hip abduction    Hip adduction    Hip internal rotation    Hip  external rotation    Knee flexion 124 c pain 126  c pain  Knee extension 0 0  Ankle dorsiflexion    Ankle plantarflexion        Hamstring supine, opposite knee bent 55 52   (Blank rows = not tested)  LOWER EXTREMITY MMT:  MMT Right Eval 03/23/24  Left Eval 03/23/24  Hip flexion    Hip extension    Hip abduction    Hip adduction    Hip internal rotation    Hip external rotation    Knee flexion 59.5 lbs 65.2 lbs  Knee extension 70.7 lbs 65.9 lbs  Ankle dorsiflexion    Ankle plantarflexion    Ankle inversion    Ankle eversion     (Blank rows = not tested)    FUNCTIONAL TESTS:  04/21/2024: Rt SLS: 30 Seconds Lt SLS: 15 seconds  03/23/24:  5 time sit to stand: 19.30 seconds no UE support  GAIT: 03/23/2024 Distance walked: clinic distances Assistive device utilized: None Level of assistance: Complete Independence Comments: antalgic, wide BOS, decreased terminal knee extension                                                                                                                                                                        TODAY'S TREATMENT                                                                          DATE: 04/21/2024 Therex: UBE LE only lvl 4.0 8 mins with 15 seconds faster interval top of each minute Knee extension machine double leg up , single leg lowering 15-90 deg 20 lbs 2 x 10 bilateral  Seated quad set with SLR with lift up and over laterally over kettle bell approx. 6 inch height 2 x 10 bilateral TRX supported double leg squat with emphasis on no tibial anterior displacement - 2 x 15  Neuro Re-ed Lateral stepping reactive blazepod 3 lights, 2 second time out 30 sec x 3 bilateral with 15 sec rest breaks (advised shortened step to avoid Lt knee pain).  SLS on foam with reactive light touching 4 lights in a square 30 sec x 1 bilateral (stopped due to Lt leg pain)  SLS Rt leg on floor 30 seconds, Lt leg 15 seconds x  2     TODAY'S TREATMENT                                                                          DATE:04/14/24 Therex: Recumbent bike: Level 3 x 5 minutes LAQ: c 3# 2 x 10 holding 3 sec bil  Seated SLR: 2 x 10 bil  Standing calf stretch: slant board x 3 holding 30 sec TherActivites:  Double Leg Press: 87# 3 x 10 slow eccentrics preventing TKE with sled moved out so knees are bending to 70 degrees, single leg press: 37# 2 x 10      TODAY'S TREATMENT                                                                          DATE: 04/10/2024 Therex: Recumbent bike lvl 3 5 mins for ROM warm up Seated quad set 5 sec hold x 1 with cues for activation in home use.  Seated quad set with SLR x 10 bilateral  Leg press double leg 15 x 81 lbs, single leg only 31 lbs x 15 bilateral (cues for adjusting foot positioning to avoid knee pain). S Seated isometric extension/flexion 5 sec hold with one leg pushing on the other. X 10     TODAY'S TREATMENT                                                                          DATE:03/23/24 Therex: HEP instruction/performance c cues for techniques, handout provided.  Trial set performed of each for comprehension and symptom assessment.  See below for exercise list Pt instructed to use his [employer] access to ride the recumbent bike 3-4 x / week for up to 10 minutes beginning with light resistance and progressing as tolerated in pain free motion Recumbent bike: level 2 x 3 minutes   PATIENT EDUCATION:  Education details: HEP, POC Person educated: Patient Education method: Programmer, multimedia, Demonstration, Verbal cues, and Handouts Education comprehension: verbalized understanding, returned demonstration, and verbal cues required  HOME EXERCISE PROGRAM: Access Code: [email or web address] URL: [email or web address] Date: 04/10/2024 Prepared by: Ozell Silvan  Exercises - Supine Bridge  - 2 x daily - 7 x weekly - 2 sets - 10 reps - Supine Knee  Extension Strengthening  - 2 x daily - 7  x weekly - 2 sets - 10 reps - 5 seconds hold - Seated Hamstring Stretch  - 2 x daily - 7 x weekly - 3 reps - 30 seconds hold - Sitting Knee Extension with Resistance  - 2 x daily - 7 x weekly - 2 sets - 10 reps - Seated Quad Set  - 3-5 x daily - 7 x weekly - 1 sets - 10 reps - 5 hold - Seated Straight Leg Heel Taps  - 1-2 x daily - 7 x weekly - 3 sets - 10 reps  ASSESSMENT:  CLINICAL IMPRESSION: Symptoms noted at times but improved with adjustments to difficulty of activity.  No worse reported at rest after.  SLS on Lt impacted compared to Rt.  Introduced knee extension machine for loading, avoiding full extension due to anterior knee complaints history. Continued skilled PT services indicated at this time.    OBJECTIVE IMPAIRMENTS: difficulty walking, decreased ROM, decreased strength, increased edema, impaired flexibility, and pain.   ACTIVITY LIMITATIONS: lifting, bending, standing, squatting, and stairs  PARTICIPATION LIMITATIONS: community activity and occupation  PERSONAL FACTORS: 1 comorbidity: see PMH are also affecting patient's functional outcome.   REHAB POTENTIAL: Excellent  CLINICAL DECISION MAKING: Stable/uncomplicated  EVALUATION COMPLEXITY: Low   GOALS: Goals reviewed with patient? Yes  SHORT TERM GOALS: (target date for Short term goals are 3 weeks 04/13/2024)   1.  Patient will demonstrate independent use of home exercise program to maintain progress from in clinic treatments.  Goal status: Met   LONG TERM GOALS: (target dates for all long term goals are 8 weeks  05/22/2024 )   1. Patient will demonstrate/report pain at worst less than or equal to 2/10 to facilitate minimal limitation in daily activity secondary to pain symptoms.  Goal status: on going 04/21/2024   2. Patient will demonstrate independent use of home exercise program to facilitate ability to maintain/progress functional gains from skilled physical  therapy services.  Goal status: on going 04/21/2024   3. Patient will demonstrate Patient specific functional scale avg > or = 6.6 to indicate reduced disability due to condition.   Goal status: on going 04/21/2024   4.  Patient will demonstrate bil LE MMT by >/= 10 lbs using HHD with knee flexion and extension throughout to faciltiate usual transfers, stairs, squatting at PLOF for daily life.   Goal status: on going 04/21/2024   5.  Patient will demonstrate up and down 1 flight of stairs with single hand rail with no pain noted in bilateral knees.  Goal status: Met 04/14/24   6.  Pt will be able to perform full squat using correct body mechanics with pain of </= 2/10.  Goal status: on going 04/21/2024      PLAN:  PT FREQUENCY: 1-2x/week  PT DURATION: 10 weeks  PLANNED INTERVENTIONS: Can include 02853- PT Re-evaluation, 97110-Therapeutic exercises, 97530- Therapeutic activity, 97112- Neuromuscular re-education, 97535- Self Care, 97140- Manual therapy, [phone]- Gait training, [phone]- Orthotic Fit/training, [phone]- Canalith repositioning, [id number]- Aquatic Therapy, [phone]- Electrical stimulation (unattended), [id number] Physical performance testing, 97016- Vasopneumatic device, [id number]- Ultrasound, [id number]- Traction (mechanical), [id number]- Ionotophoresis 4mg /ml Dexamethasone,  20560 - Needle insertion w/o injection 1 or 2 muscles, 20561 - Needle insertion w/o injection 3 or more muscles.   Patient/Family education, Balance training, Stair training, Taping, Dry Needling, Joint mobilization, Joint manipulation, Spinal manipulation, Spinal mobilization, Scar mobilization, Vestibular training, Visual/preceptual remediation/compensation, DME instructions, Cryotherapy, and Moist heat.  All performed as medically necessary.  All included unless contraindicated  PLAN  FOR NEXT SESSION:  Dynamometry strength update.    Ozell Silvan, PT, DPT, OCS, ATC 04/21/24  8:44 AM

## 2024-04-28 ENCOUNTER — Ambulatory Visit (INDEPENDENT_AMBULATORY_CARE_PROVIDER_SITE_OTHER): Admitting: Rehabilitative and Restorative Service Providers"

## 2024-04-28 ENCOUNTER — Encounter: Payer: Self-pay | Admitting: Rehabilitative and Restorative Service Providers"

## 2024-04-28 DIAGNOSIS — R600 Localized edema: Secondary | ICD-10-CM | POA: Diagnosis not present

## 2024-04-28 DIAGNOSIS — R262 Difficulty in walking, not elsewhere classified: Secondary | ICD-10-CM | POA: Diagnosis not present

## 2024-04-28 DIAGNOSIS — M25561 Pain in right knee: Secondary | ICD-10-CM | POA: Diagnosis not present

## 2024-04-28 DIAGNOSIS — M25562 Pain in left knee: Secondary | ICD-10-CM

## 2024-04-28 DIAGNOSIS — G8929 Other chronic pain: Secondary | ICD-10-CM

## 2024-04-28 DIAGNOSIS — M6281 Muscle weakness (generalized): Secondary | ICD-10-CM | POA: Diagnosis not present

## 2024-04-28 NOTE — Therapy (Signed)
 OUTPATIENT PHYSICAL THERAPY TREATMENT   Patient Name: Zachary Fischer MRN: [id number] DOB:1981/08/03, 43 y.o., male Today's Date: 04/28/2024  END OF SESSION:  PT End of Session - 04/28/24 0808     Visit Number 5    Number of Visits 16    Date for PT Re-Evaluation 05/22/24    Authorization Type [employer] and [employer]    Progress Note Due on Visit 10    PT Start Time 0802    PT Stop Time 0841    PT Time Calculation (min) 39 min    Activity Tolerance Patient tolerated treatment well    Behavior During Therapy [facility] for tasks assessed/performed              Past Medical History:  Diagnosis Date   Allergy    Past Surgical History:  Procedure Laterality Date   ESOPHAGOGASTRODUODENOSCOPY (EGD) WITH PROPOFOL  N/A 05/13/2019   Procedure: ESOPHAGOGASTRODUODENOSCOPY (EGD) WITH PROPOFOL ;  Surgeon: Therisa Bi, MD;  Location: [facility] ENDOSCOPY;  Service: Gastroenterology;  Laterality: N/A;   NECK SURGERY     08/19 lipoma    Patient Active Problem List   Diagnosis Date Noted   Complete tear of ligament of thumb 12/10/2023   Left thumb sprain 11/26/2023   Pain in right knee 11/08/2023   Esophageal obstruction 05/13/2019   Routine general medical examination at a health care facility 05/29/2013    PCP: Duanne Butler DASEN, MD   REFERRING PROVIDER: Jerri Kay HERO, MD   REFERRING DIAG:  Diagnosis  M25.561,M25.562,G89.29 (ICD-10-CM) - Chronic pain of both knees    THERAPY DIAG:  Bilateral chronic knee pain  Muscle weakness (generalized)  Difficulty in walking, not elsewhere classified  Localized edema  Rationale for Evaluation and Treatment: Rehabilitation  ONSET DATE: Pt reporting pain has been ongoing for years and gradually wosened   SUBJECTIVE:   SUBJECTIVE STATEMENT: Pt indicated going to [employer] with lots of walking, stairs.  Reported increase in symptoms noted after.  Reported 5-6/10 achy last day or so.   Reported having some Rt hip anterior/lateral. Noted with  stairs and decline.   PERTINENT HISTORY: Neck surgery -tumor removal   PAIN:  NPRS scale: 5-6/10.  Pain location: bilateral knees Pain description: achy Aggravating factors: walking up stairs, transfers after sitting prolonged.  Relieving factors: Tylenol , Advil , Icy-hot   PRECAUTIONS: None   WEIGHT BEARING RESTRICTIONS: No  FALLS:  Has patient fallen in last 6 months? 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 Possible scarring or localized nodular synovitis in the prefemoral fat without associated osseous erosion. 2. No other significant osseous findings are identified. The menisci, cruciate and collateral ligaments are intact. 3. No acute osseous findings.  PATIENT SURVEYS:  Patient-Specific Activity Scoring Scheme  0 represents "unable to perform." 10 represents "able to perform at prior level. 0 1 2 3 4 5 6 7 8 9  10 (Date and Score)   Activity Eval  03/23/24  04/14/24  1. running  4 4   2. Walking up flights of steps  5 8   3. Marching for [employer] purposes 5 3  4.  5.    Score 4.6 5   Total score = sum of the activity scores/number of activities Minimum detectable change (90%CI) for average score = 2 points Minimum detectable change (90%CI) for single activity score = 3 points  COGNITION: 03/23/2024 Overall cognitive status: WFL    SENSATION: 03/23/2024 [facility]  MUSCLE LENGTH 04/28/2024: Positive Rt thomas for hip flexor tightness with pain  EDEMA:  03/23/2024 Circumferential: Rt knee: 40 centimeters, Left knee: 39.5 centimeters   LOWER  EXTREMITY ROM:   ROM Right Eval 03/23/24 Supine active Left Eval 03/23/24 Supine  active  Hip flexion    Hip extension    Hip abduction    Hip adduction    Hip internal rotation    Hip external rotation    Knee flexion 124 c pain 126  c pain  Knee extension 0 0  Ankle dorsiflexion    Ankle plantarflexion        Hamstring supine, opposite knee bent 55 52   (Blank rows = not tested)  LOWER EXTREMITY MMT:  MMT Right Eval 03/23/24  Left Eval 03/23/24  Hip flexion    Hip extension    Hip abduction    Hip adduction    Hip internal rotation    Hip external rotation    Knee flexion 59.5 lbs 65.2 lbs  Knee extension 70.7 lbs 65.9 lbs  Ankle dorsiflexion    Ankle plantarflexion    Ankle inversion    Ankle eversion     (Blank rows = not tested)    FUNCTIONAL TESTS:  04/21/2024: Rt SLS: 30 Seconds Lt SLS: 15 seconds  03/23/24:  5 time sit to stand: 19.30 seconds no UE support  GAIT: 03/23/2024 Distance walked: clinic distances Assistive device utilized: None Level of assistance: Complete Independence Comments: antalgic, wide BOS, decreased terminal knee extension                                                                                                                                                                        TODAY'S TREATMENT                                                                          DATE: 04/28/2024 Therex: Recumbent bike lvl 3 10 mins  Supine thomas test Rt hip flexor stretch with foot on table 15 seconds x 3 (cues for home use with foot down then progressing to pull to chest as able.  Prone Rt quad stretch  with strap 15 seconds x 3 (cues for home use) Supine bridge x 10   Self Care Tennis ball STM instruction and trial    TherActivity Leg press double leg 87 lbs 2 x 15, single leg 37 lbs 2 x 15 bilaterally   Manual Percussive device to Rt anterior hip near rectus, TFL proximal attachments. Done in supine.    TODAY'S  TREATMENT                                                                          DATE: 04/21/2024 Therex: UBE LE only lvl 4.0 8 mins with 15 seconds faster interval top of each minute Knee extension machine double leg up , single leg lowering 15-90 deg 20 lbs 2 x 10 bilateral  Seated quad set with SLR with lift up and over laterally over kettle bell approx. 6 inch height 2 x 10 bilateral TRX supported double leg squat with emphasis on no tibial anterior displacement - 2 x 15   Neuro Re-ed Lateral stepping reactive blazepod 3 lights, 2 second time out 30 sec x 3 bilateral with 15 sec rest breaks (advised shortened step to avoid Lt knee pain).  SLS on foam with reactive light touching 4 lights in a square 30 sec x 1 bilateral (stopped due to Lt leg pain)  SLS Rt leg on floor 30 seconds, Lt leg 15 seconds x 2     TODAY'S TREATMENT                                                                          DATE:04/14/24 Therex: Recumbent bike: Level 3 x 5 minutes LAQ: c 3# 2 x 10 holding 3 sec bil  Seated SLR: 2 x 10 bil  Standing calf stretch: slant board x 3 holding 30 sec TherActivites:  Double Leg Press: 87# 3 x 10 slow eccentrics preventing TKE with sled moved out so knees are bending to 70 degrees, single leg press: 37# 2 x 10      TODAY'S TREATMENT                                                                          DATE: 04/10/2024 Therex: Recumbent bike lvl 3 5 mins for ROM warm up Seated quad set 5 sec hold x 1 with cues for activation in home use.  Seated quad set with SLR x 10 bilateral  Leg press double leg 15 x 81 lbs, single leg only 31 lbs x 15 bilateral (cues for adjusting foot positioning to avoid knee pain). S Seated isometric extension/flexion 5 sec hold with one leg pushing on the other. X 10   PATIENT EDUCATION:  Education details:  HEP, POC Person educated: Patient Education method: Explanation, Demonstration, Verbal cues, and Handouts Education  comprehension: verbalized understanding, returned demonstration, and verbal cues required  HOME EXERCISE PROGRAM: Access Code: [email or web address] URL: [email or web address] Date: 04/28/2024 Prepared by: Ozell Silvan  Exercises - Supine Bridge  - 2 x daily - 7 x weekly - 2 sets - 10 reps - Seated Hamstring Stretch  - 2 x daily - 7 x weekly - 3 reps - 30 seconds hold - Sitting Knee Extension with Resistance  - 2 x daily - 7 x weekly - 2 sets - 10 reps - Seated Quad Set  - 3-5 x daily - 7 x weekly - 1 sets - 10 reps - 5 hold - Seated SLR  - 1-2 x daily - 7 x weekly - 1-2 sets - 10-15 reps - 2 hold - Modified Thomas Stretch  - 2-3 x daily - 7 x weekly - 1 sets - 3-5 reps - 15-30 hold - Prone Quadriceps Stretch with Strap  - 2-3 x daily - 7 x weekly - 1 sets - 3-5 reps - 15-30 hold  ASSESSMENT:  CLINICAL IMPRESSION: Discussed anterior hip complaints possibly related to proximal rectus , hip flexor aggravations.  Discussed adjustments of seated SLR and other intervention to avoid worsened symptoms.  Adjusted WB activity today to accommodate knee complaints from weekend.   Due to anterior hip complaints, spent time with manual and discussion of stretching at home as well as holding SLR.   Symptoms seem to be related to irritation of myofascial tissue in Rt hip flexors.    OBJECTIVE IMPAIRMENTS: difficulty walking, decreased ROM, decreased strength, increased edema, impaired flexibility, and pain.   ACTIVITY LIMITATIONS: lifting, bending, standing, squatting, and stairs  PARTICIPATION LIMITATIONS: community activity and occupation  PERSONAL FACTORS: 1 comorbidity: see PMH are also affecting patient's functional outcome.   REHAB POTENTIAL: Excellent  CLINICAL DECISION MAKING: Stable/uncomplicated  EVALUATION COMPLEXITY: Low   GOALS: Goals reviewed with patient? Yes  SHORT TERM GOALS: (target date for Short term goals are 3 weeks 04/13/2024)   1.  Patient will demonstrate  independent use of home exercise program to maintain progress from in clinic treatments.  Goal status: Met   LONG TERM GOALS: (target dates for all long term goals are 8 weeks  05/22/2024 )   1. Patient will demonstrate/report pain at worst less than or equal to 2/10 to facilitate minimal limitation in daily activity secondary to pain symptoms.  Goal status: on going 04/21/2024   2. Patient will demonstrate independent use of home exercise program to facilitate ability to maintain/progress functional gains from skilled physical therapy services.  Goal status: on going 04/21/2024   3. Patient will demonstrate Patient specific functional scale avg > or = 6.6 to indicate reduced disability due to condition.   Goal status: on going 04/21/2024   4.  Patient will demonstrate bil LE MMT by >/= 10 lbs using HHD with knee flexion and extension throughout to faciltiate usual transfers, stairs, squatting at PLOF for daily life.   Goal status: on going 04/21/2024   5.  Patient will demonstrate up and down 1 flight of stairs with single hand rail with no pain noted in bilateral knees.  Goal status: Met 04/14/24   6.  Pt will be able to perform full squat using correct body mechanics with pain of </= 2/10.  Goal status: on going 04/21/2024      PLAN:  PT FREQUENCY: 1-2x/week  PT DURATION: 10 weeks  PLANNED  INTERVENTIONS: Can include 02853- PT Re-evaluation, 97110-Therapeutic exercises, 97530- Therapeutic activity, [id number]- Neuromuscular re-education, [phone]- Self Care, 97140- Manual therapy, [phone]- Gait training, [phone]- Orthotic Fit/training, [phone]- Canalith repositioning, [id number]- Aquatic Therapy, [phone]- Electrical stimulation (unattended), [id number] Physical performance testing, 97016- Vasopneumatic device, [id number]- Ultrasound, [id number]- Traction (mechanical), [id number]- Ionotophoresis 4mg /ml Dexamethasone,  20560 - Needle insertion w/o injection 1 or 2 muscles, 20561 - Needle insertion w/o injection 3 or more  muscles.   Patient/Family education, Balance training, Stair training, Taping, Dry Needling, Joint mobilization, Joint manipulation, Spinal manipulation, Spinal mobilization, Scar mobilization, Vestibular training, Visual/preceptual remediation/compensation, DME instructions, Cryotherapy, and Moist heat.  All performed as medically necessary.  All included unless contraindicated  PLAN FOR NEXT SESSION:  Check response to anterior hip treatment additions.    Ozell Silvan, PT, DPT, OCS, ATC 04/28/24  8:43 AM

## 2024-05-05 ENCOUNTER — Encounter: Payer: Self-pay | Admitting: Rehabilitative and Restorative Service Providers"

## 2024-05-05 ENCOUNTER — Ambulatory Visit (INDEPENDENT_AMBULATORY_CARE_PROVIDER_SITE_OTHER): Admitting: Rehabilitative and Restorative Service Providers"

## 2024-05-05 DIAGNOSIS — R600 Localized edema: Secondary | ICD-10-CM | POA: Diagnosis not present

## 2024-05-05 DIAGNOSIS — R262 Difficulty in walking, not elsewhere classified: Secondary | ICD-10-CM

## 2024-05-05 DIAGNOSIS — M6281 Muscle weakness (generalized): Secondary | ICD-10-CM

## 2024-05-05 DIAGNOSIS — M25561 Pain in right knee: Secondary | ICD-10-CM | POA: Diagnosis not present

## 2024-05-05 DIAGNOSIS — M25562 Pain in left knee: Secondary | ICD-10-CM

## 2024-05-05 DIAGNOSIS — G8929 Other chronic pain: Secondary | ICD-10-CM

## 2024-05-05 NOTE — Therapy (Addendum)
 OUTPATIENT PHYSICAL THERAPY TREATMENT   Patient Name: Zachary Fischer MRN: [id number] DOB:[birth year], 43 y.o., male Today's Date: 05/05/2024  END OF SESSION:  PT End of Session - 05/05/24 0809     Visit Number 6    Number of Visits 16    Date for PT Re-Evaluation 05/22/24    Authorization Type [employer] and [employer]    Progress Note Due on Visit 10    PT Start Time 0804    PT Stop Time 0843    PT Time Calculation (min) 39 min    Activity Tolerance Patient tolerated treatment well    Behavior During Therapy [facility] for tasks assessed/performed               Past Medical History:  Diagnosis Date   Allergy    Past Surgical History:  Procedure Laterality Date   ESOPHAGOGASTRODUODENOSCOPY (EGD) WITH PROPOFOL  N/A 05/13/2019   Procedure: ESOPHAGOGASTRODUODENOSCOPY (EGD) WITH PROPOFOL ;  Surgeon: Therisa Bi, MD;  Location: [facility] ENDOSCOPY;  Service: Gastroenterology;  Laterality: N/A;   NECK SURGERY     08/19 lipoma    Patient Active Problem List   Diagnosis Date Noted   Complete tear of ligament of thumb 12/10/2023   Left thumb sprain 11/26/2023   Pain in right knee 11/08/2023   Esophageal obstruction 05/13/2019   Routine general medical examination at a health care facility 05/29/2013    PCP: Duanne Butler DASEN, MD   REFERRING PROVIDER: Jerri Kay HERO, MD   REFERRING DIAG:  Diagnosis  M25.561,M25.562,G89.29 (ICD-10-CM) - Chronic pain of both knees    THERAPY DIAG:  Bilateral chronic knee pain  Muscle weakness (generalized)  Difficulty in walking, not elsewhere classified  Localized edema  Rationale for Evaluation and Treatment: Rehabilitation  ONSET DATE: Pt reporting pain has been ongoing for years and gradually wosened   SUBJECTIVE:   SUBJECTIVE STATEMENT: Pt indicated having some improvement in anterior hip complaints with use of stretching at home and taking it easy on HEP.  Pt indicated some variable symptoms in knees.  Did notice 8/10 pain with walking  in knees on Sunday with some stairs.   PERTINENT HISTORY: Neck surgery -tumor removal   PAIN:  NPRS scale: upon arrival dull pain Pain location: bilateral knees Pain description: achy Aggravating factors: walking up stairs, transfers after sitting prolonged.  Relieving factors: Tylenol , Advil , Icy-hot   PRECAUTIONS: None   WEIGHT BEARING RESTRICTIONS: No  FALLS:  Has patient fallen in last 6 months? 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 Possible scarring or localized nodular synovitis in the prefemoral fat without associated osseous erosion. 2. No other significant osseous findings are identified. The menisci, cruciate and collateral ligaments are intact. 3. No acute osseous findings.  PATIENT SURVEYS:  Patient-Specific Activity Scoring Scheme  0 represents "unable to perform." 10 represents "able to perform at prior level. 0 1 2 3 4 5 6 7 8 9  10 (Date and Score)   Activity Eval  03/23/24  04/14/24  1. running  4 4   2. Walking up flights of steps  5 8   3. Marching for  military purposes 5 3  4.    5.    Score 4.6 5   Total score = sum of the activity scores/number of activities Minimum detectable change (90%CI) for average score = 2 points Minimum detectable change (90%CI) for single activity score = 3 points  COGNITION: 03/23/2024 Overall cognitive status: WFL    SENSATION: 03/23/2024 [employer]  MUSCLE LENGTH 04/28/2024: Positive Rt thomas for hip flexor tightness with pain  EDEMA:  03/23/2024 Circumferential: Rt knee: 40 centimeters, Left knee:  39.5 centimeters   LOWER EXTREMITY ROM:   ROM Right Eval 03/23/24 Supine active Left Eval 03/23/24 Supine  active  Hip flexion    Hip extension    Hip abduction    Hip adduction    Hip internal rotation    Hip external rotation    Knee flexion 124 c pain 126  c pain  Knee extension 0 0  Ankle dorsiflexion    Ankle plantarflexion        Hamstring supine, opposite knee bent 55 52   (Blank rows = not tested)  LOWER EXTREMITY MMT:  MMT Right Eval 03/23/24  Left Eval 03/23/24 Right 05/05/2024 Left 05/05/2024  Hip flexion      Hip extension      Hip abduction      Hip adduction      Hip internal rotation      Hip external rotation      Knee flexion 59.5 lbs 65.2 lbs    Knee extension 70.7 lbs 65.9 lbs 5/5 81, 83 lbs 5/5 94, 91  Ankle dorsiflexion      Ankle plantarflexion      Ankle inversion      Ankle eversion       (Blank rows = not tested)    FUNCTIONAL TESTS:  04/21/2024: Rt SLS: 30 Seconds Lt SLS: 15 seconds  03/23/24:  5 time sit to stand: 19.30 seconds no UE support  GAIT: 03/23/2024 Distance walked: clinic distances Assistive device utilized: None Level of assistance: Complete Independence Comments: antalgic, wide BOS, decreased terminal knee extension                                                                                                                                                                        TODAY'S TREATMENT                                                                          DATE: 05/05/2024 Therex:  Recumbent bike lvl 3 10 mins  Knee extension machine double leg up, single leg lowering - movement to approx. 15 deg knee extension to avoid knee pain.  15 lbs 2 x 10  Additional time spent in review of techniques of intervention and continued adjustment in HEP.    TherActivity Leg press double leg 87 lbs 2 x 15 single leg 37 lbs 2 x 15 bilaterally  TRX supported double leg squat with focus on limiting anterior  tibia movement 2 x 15   TODAY'S TREATMENT                                                                          DATE: 04/28/2024 Therex: Recumbent bike lvl 3 10 mins  Supine thomas test Rt hip flexor stretch with foot on table 15 seconds x 3 (cues for home use with foot down then progressing to pull to chest as able.  Prone Rt quad stretch with strap 15 seconds x 3 (cues for home use) Supine bridge x 10    Self Care Tennis ball STM instruction and trial      TherActivity Leg press double leg 87 lbs 2 x 15, single leg 37 lbs 2 x 15 bilaterally    Manual Percussive device to Rt anterior hip near rectus, TFL proximal attachments. Done in supine.      TODAY'S TREATMENT                                                                          DATE: 04/21/2024 Therex: UBE LE only lvl 4.0 8 mins with 15 seconds faster interval top of each minute Knee extension machine double leg up , single leg lowering 15-90 deg 20 lbs 2 x 10 bilateral  Seated quad set with SLR with lift up and over laterally over kettle bell approx. 6 inch height 2 x 10 bilateral TRX supported double leg squat with emphasis on no tibial anterior displacement - 2 x 15   Neuro Re-ed Lateral stepping reactive blazepod 3 lights, 2 second time out 30 sec x 3 bilateral with 15 sec rest breaks (advised shortened step to avoid Lt knee pain).  SLS on foam with reactive light touching 4 lights in a square 30 sec x 1 bilateral (stopped due to Lt leg pain)  SLS Rt leg on floor 30 seconds, Lt leg 15 seconds x 2     TODAY'S TREATMENT                                                                          DATE:04/14/24 Therex: Recumbent bike: Level 3 x 5 minutes LAQ: c 3# 2 x 10 holding 3 sec bil  Seated SLR: 2 x 10 bil  Standing calf stretch: slant board x 3 holding 30 sec TherActivites:  Double Leg Press: 87# 3 x 10 slow eccentrics preventing TKE with sled moved out so knees are bending to 70 degrees, single leg press: 37#  2 x 10      TODAY'S TREATMENT                                                                          DATE: 04/10/2024 Therex: Recumbent bike lvl 3 5 mins for ROM warm up Seated quad set 5 sec hold x 1 with cues for activation in home use.  Seated quad set with SLR x 10 bilateral  Leg press double leg 15 x 81 lbs, single leg only 31 lbs x 15 bilateral (cues for adjusting foot positioning to avoid knee pain). S Seated isometric extension/flexion 5 sec hold with one leg pushing on the other. X 10   PATIENT EDUCATION:  Education details: HEP, POC Person educated: Patient Education method: Explanation, Demonstration, Verbal cues, and Handouts Education comprehension: verbalized understanding, returned demonstration, and verbal cues required  HOME EXERCISE PROGRAM: Access Code: [email or web address] URL: [email or web address] Date: 04/28/2024 Prepared by: Ozell Silvan  Exercises - Supine Bridge  - 2 x daily - 7 x weekly - 2 sets - 10 reps - Seated Hamstring Stretch  - 2 x daily - 7 x weekly - 3 reps - 30 seconds hold - Sitting Knee Extension with Resistance  - 2 x daily - 7 x weekly - 2 sets - 10 reps - Seated Quad Set  - 3-5 x daily - 7 x weekly - 1 sets - 10 reps - 5 hold - Seated SLR  - 1-2 x daily - 7 x weekly - 1-2 sets - 10-15 reps - 2 hold - Modified Thomas Stretch  - 2-3 x daily - 7 x weekly - 1 sets - 3-5 reps - 15-30 hold - Prone Quadriceps Stretch with Strap  - 2-3 x daily - 7 x weekly - 1 sets - 3-5 reps - 15-30 hold  ASSESSMENT:  CLINICAL IMPRESSION: Benefits were noted from treatment options for anterior hip complaints, most likely strained from increased standing, walking and stairs reported in time prior to last visit.   Strength check with dynamometry showed noted improvement in bilateral knee extension strength.  Continued emphasis on functional WB strength improvements and balance controls indicated to help offload knee joints to improve symptoms with daily  activity.     OBJECTIVE IMPAIRMENTS: difficulty walking, decreased ROM, decreased strength, increased edema, impaired flexibility, and pain.   ACTIVITY LIMITATIONS: lifting, bending, standing, squatting, and stairs  PARTICIPATION LIMITATIONS: community activity and occupation  PERSONAL FACTORS: 1 comorbidity: see PMH are also affecting patient's functional outcome.   REHAB POTENTIAL: Excellent  CLINICAL DECISION MAKING: Stable/uncomplicated  EVALUATION COMPLEXITY: Low   GOALS: Goals reviewed with patient? Yes  SHORT TERM GOALS: (target date for Short term goals are 3 weeks 04/13/2024)   1.  Patient will demonstrate independent use of home exercise program to maintain progress from in clinic treatments.  Goal status: Met   LONG TERM GOALS: (target dates for all long term goals are 8 weeks  05/22/2024 )  1. Patient will demonstrate/report pain at worst less than or equal to 2/10 to facilitate minimal limitation in daily activity secondary to pain symptoms.  Goal status: on going 04/21/2024   2. Patient will demonstrate independent use of home exercise program to facilitate ability to maintain/progress functional gains from skilled physical therapy services.  Goal status: on going 04/21/2024   3. Patient will demonstrate Patient specific functional scale avg > or = 6.6 to indicate reduced disability due to condition.   Goal status: on going 04/21/2024   4.  Patient will demonstrate bil LE MMT by >/= 10 lbs using HHD with knee flexion and extension throughout to faciltiate usual transfers, stairs, squatting at PLOF for daily life.   Goal status:Met for lbs number extension.    5.  Patient will demonstrate up and down 1 flight of stairs with single hand rail with no pain noted in bilateral knees.  Goal status: Met 04/14/24   6.  Pt will be able to perform full squat using correct body mechanics with pain of </= 2/10.  Goal status: on going 04/21/2024      PLAN:  PT  FREQUENCY: 1-2x/week  PT DURATION: 10 weeks  PLANNED INTERVENTIONS: Can include 02853- PT Re-evaluation, 97110-Therapeutic exercises, 97530- Therapeutic activity, 97112- Neuromuscular re-education, 97535- Self Care, 97140- Manual therapy, [phone]- Gait training, [phone]- Orthotic Fit/training, [phone]- Canalith repositioning, [id number]- Aquatic Therapy, [phone]- Electrical stimulation (unattended), [id number] Physical performance testing, 97016- Vasopneumatic device, [id number]- Ultrasound, [id number]- Traction (mechanical), [id number]- Ionotophoresis 4mg /ml Dexamethasone,  20560 - Needle insertion w/o injection 1 or 2 muscles, 20561 - Needle insertion w/o injection 3 or more muscles.   Patient/Family education, Balance training, Stair training, Taping, Dry Needling, Joint mobilization, Joint manipulation, Spinal manipulation, Spinal mobilization, Scar mobilization, Vestibular training, Visual/preceptual remediation/compensation, DME instructions, Cryotherapy, and Moist heat.  All performed as medically necessary.  All included unless contraindicated  PLAN FOR NEXT SESSION: Dynamic, compliant surface balance, WB strengthening.    Ozell Silvan, PT, DPT, OCS, ATC 05/05/24  8:43 AM

## 2024-05-06 NOTE — Therapy (Addendum)
 OUTPATIENT PHYSICAL THERAPY TREATMENT   Patient Name: Zachary Fischer MRN: [id number] DOB:[birth year], 43 y.o., male Today's Date: 05/07/2024  END OF SESSION:  PT End of Session - 05/07/24 0811     Visit Number 7    Number of Visits 16    Date for PT Re-Evaluation 05/22/24    Authorization Type [employer] and [employer]    Progress Note Due on Visit 10    PT Start Time 0804    PT Stop Time 0843    PT Time Calculation (min) 39 min    Activity Tolerance Patient tolerated treatment well    Behavior During Therapy [facility] for tasks assessed/performed                Past Medical History:  Diagnosis Date   Allergy    Past Surgical History:  Procedure Laterality Date   ESOPHAGOGASTRODUODENOSCOPY (EGD) WITH PROPOFOL  N/A 05/13/2019   Procedure: ESOPHAGOGASTRODUODENOSCOPY (EGD) WITH PROPOFOL ;  Surgeon: Therisa Bi, MD;  Location: [facility] ENDOSCOPY;  Service: Gastroenterology;  Laterality: N/A;   NECK SURGERY     08/19 lipoma    Patient Active Problem List   Diagnosis Date Noted   Complete tear of ligament of thumb 12/10/2023   Left thumb sprain 11/26/2023   Pain in right knee 11/08/2023   Esophageal obstruction 05/13/2019   Routine general medical examination at a health care facility 05/29/2013    PCP: Duanne Butler DASEN, MD   REFERRING PROVIDER: Jerri Kay HERO, MD   REFERRING DIAG:  Diagnosis  M25.561,M25.562,G89.29 (ICD-10-CM) - Chronic pain of both knees    THERAPY DIAG:  Bilateral chronic knee pain  Muscle weakness (generalized)  Difficulty in walking, not elsewhere classified  Localized edema  Rationale for Evaluation and Treatment: Rehabilitation  ONSET DATE: Pt reporting pain has been ongoing for years and gradually wosened   SUBJECTIVE:   SUBJECTIVE STATEMENT: Pt indicated good feelings after last visit with the increased activity.  No specific pain indicated upon arrival.   PERTINENT HISTORY: Neck surgery -tumor removal   PAIN:  NPRS scale: 0/10 upon  arrival.  Pain location: bilateral knees Pain description: achy Aggravating factors: walking up stairs, transfers after sitting prolonged.  Relieving factors: Tylenol , Advil , Icy-hot   PRECAUTIONS: None   WEIGHT BEARING RESTRICTIONS: No  FALLS:  Has patient fallen in last 6 months? 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 Possible scarring or localized nodular synovitis in the prefemoral fat without associated osseous erosion. 2. No other significant osseous findings are identified. The menisci, cruciate and collateral ligaments are intact. 3. No acute osseous findings.  PATIENT SURVEYS:  Patient-Specific Activity Scoring Scheme  0 represents "unable to perform." 10 represents "able to perform at prior level. 0 1 2 3 4 5 6 7 8 9  10 (Date and Score)   Activity Eval  03/23/24  04/14/24  1. running  4 4   2. Walking up flights of steps  5 8   3. Marching for [employer] purposes 5 3  4.    5.    Score 4.6 5   Total score = sum of the  activity scores/number of activities Minimum detectable change (90%CI) for average score = 2 points Minimum detectable change (90%CI) for single activity score = 3 points  COGNITION: 03/23/2024 Overall cognitive status: WFL    SENSATION: 03/23/2024 [facility]  MUSCLE LENGTH 04/28/2024: Positive Rt thomas for hip flexor tightness with pain  EDEMA:  03/23/2024 Circumferential: Rt knee: 40 centimeters, Left knee: 39.5 centimeters   LOWER EXTREMITY ROM:   ROM Right Eval 03/23/24 Supine active Left Eval 03/23/24 Supine  active  Hip  flexion    Hip extension    Hip abduction    Hip adduction    Hip internal rotation    Hip external rotation    Knee flexion 124 c pain 126  c pain  Knee extension 0 0  Ankle dorsiflexion    Ankle plantarflexion        Hamstring supine, opposite knee bent 55 52   (Blank rows = not tested)  LOWER EXTREMITY MMT:  MMT Right Eval 03/23/24  Left Eval 03/23/24 Right 05/05/2024 Left 05/05/2024  Hip flexion      Hip extension      Hip abduction      Hip adduction      Hip internal rotation      Hip external rotation      Knee flexion 59.5 lbs 65.2 lbs    Knee extension 70.7 lbs 65.9 lbs 5/5 81, 83 lbs 5/5 94, 91  Ankle dorsiflexion      Ankle plantarflexion      Ankle inversion      Ankle eversion       (Blank rows = not tested)    FUNCTIONAL TESTS:  04/21/2024: Rt SLS: 30 Seconds Lt SLS: 15 seconds  03/23/24:  5 time sit to stand: 19.30 seconds no UE support  GAIT: 03/23/2024 Distance walked: clinic distances Assistive device utilized: None Level of assistance: Complete Independence Comments: antalgic, wide BOS, decreased terminal knee extension                                                                                                                                                                        TODAY'S TREATMENT                                                                          DATE: 05/06/2024 Therex: UBE LE only seat 11, aerobic with interval training :15 seconds up to 100-110 rpm each minute starting at 2 mins through 8 mins,  10 mins total.   Lvl 4.0.  Wall sit hold in approx. 50 deg knee flexion to fatigue 1 min, 45 seconds, 45 seconds  TherActivity (to improve squat movement, transfers, stair control) TRX double leg squat x 20 TRX single leg reverse lunge slider to tolerance 2 x 15 bilateral, performed with slow control focus (had pain complaint on last 2 reps with Lt knee with Lt leg in back   Neuro Re-ed (muscle activation, balance  improvements, stability Tandem ambulation on foam fwd/back 6 ft x 5 each way  SLS with slight knee flexion on foam 1 min x 1 bilateral  Fitter rocker board fwd/back light touch control for ankle strategy x 25 each way, 1 min hover balance attempts with occasional to moderate HHA on bar.      TODAY'S TREATMENT                                                                          DATE: 04/28/2024 Therex: Recumbent bike lvl 3 10 mins  Knee extension machine double leg up, single leg lowering - movement to approx. 15 deg knee extension to avoid knee pain.  15 lbs 2 x 10  Additional time spent in review of techniques of intervention and continued adjustment in HEP.    TherActivity Leg press double leg 87 lbs 2 x 15 single leg 37 lbs 2 x 15 bilaterally  TRX supported double leg squat with focus on limiting anterior tibia movement 2 x 15      TODAY'S TREATMENT                                                                          DATE: 04/21/2024 Therex: UBE LE only lvl 4.0 8 mins with 15 seconds faster interval top of each minute Knee extension machine double leg up , single leg lowering 15-90 deg 20 lbs 2 x 10 bilateral  Seated quad set with SLR with lift up and over laterally over kettle bell approx. 6 inch height 2 x 10 bilateral TRX supported double leg squat with emphasis on no tibial anterior displacement - 2 x 15   Neuro Re-ed Lateral stepping reactive blazepod 3 lights, 2 second time out 30 sec x 3 bilateral with 15 sec rest breaks (advised shortened step to avoid Lt knee pain).  SLS on foam with reactive light touching 4 lights in a square 30 sec x 1 bilateral (stopped due to Lt leg pain)  SLS Rt leg on floor 30 seconds, Lt leg 15 seconds x 2     TODAY'S TREATMENT  DATE:04/14/24 Therex: Recumbent bike: Level 3 x 5 minutes LAQ: c 3# 2 x 10 holding 3 sec bil  Seated SLR: 2 x 10 bil  Standing calf  stretch: slant board x 3 holding 30 sec TherActivites:  Double Leg Press: 87# 3 x 10 slow eccentrics preventing TKE with sled moved out so knees are bending to 70 degrees, single leg press: 37# 2 x 10      TODAY'S TREATMENT                                                                          DATE: 04/10/2024 Therex: Recumbent bike lvl 3 5 mins for ROM warm up Seated quad set 5 sec hold x 1 with cues for activation in home use.  Seated quad set with SLR x 10 bilateral  Leg press double leg 15 x 81 lbs, single leg only 31 lbs x 15 bilateral (cues for adjusting foot positioning to avoid knee pain). S Seated isometric extension/flexion 5 sec hold with one leg pushing on the other. X 10   PATIENT EDUCATION:  Education details: HEP, POC Person educated: Patient Education method: Explanation, Demonstration, Verbal cues, and Handouts Education comprehension: verbalized understanding, returned demonstration, and verbal cues required  HOME EXERCISE PROGRAM: Access Code: [email or web address] URL: [email or web address] Date: 04/28/2024 Prepared by: Ozell Silvan  Exercises - Supine Bridge  - 2 x daily - 7 x weekly - 2 sets - 10 reps - Seated Hamstring Stretch  - 2 x daily - 7 x weekly - 3 reps - 30 seconds hold - Sitting Knee Extension with Resistance  - 2 x daily - 7 x weekly - 2 sets - 10 reps - Seated Quad Set  - 3-5 x daily - 7 x weekly - 1 sets - 10 reps - 5 hold - Seated SLR  - 1-2 x daily - 7 x weekly - 1-2 sets - 10-15 reps - 2 hold - Modified Thomas Stretch  - 2-3 x daily - 7 x weekly - 1 sets - 3-5 reps - 15-30 hold - Prone Quadriceps Stretch with Strap  - 2-3 x daily - 7 x weekly - 1 sets - 3-5 reps - 15-30 hold  ASSESSMENT:  CLINICAL IMPRESSION: Fair to good control on static balance attempts on compliant surfaces.  Lt knee pain was noted on a few occasions (end of reverse lunge slider, SLS in knee flexion).  Will continue to adapt intervention to avoid worsened symptoms  while promoting strength and coordination gains.  In general, less limitations in activity due to pain noted in clinic today and last visit vs. 2-3 weeks ago , though still present.    OBJECTIVE IMPAIRMENTS: difficulty walking, decreased ROM, decreased strength, increased edema, impaired flexibility, and pain.   ACTIVITY LIMITATIONS: lifting, bending, standing, squatting, and stairs  PARTICIPATION LIMITATIONS: community activity and occupation  PERSONAL FACTORS: 1 comorbidity: see PMH are also affecting patient's functional outcome.   REHAB POTENTIAL: Excellent  CLINICAL DECISION MAKING: Stable/uncomplicated  EVALUATION COMPLEXITY: Low   GOALS: Goals reviewed with patient? Yes  SHORT TERM GOALS: (target date for Short term goals are 3 weeks 04/13/2024)   1.  Patient will demonstrate independent use of home exercise program to maintain progress from  in clinic treatments.  Goal status: Met   LONG TERM GOALS: (target dates for all long term goals are 8 weeks  05/22/2024 )   1. Patient will demonstrate/report pain at worst less than or equal to 2/10 to facilitate minimal limitation in daily activity secondary to pain symptoms.  Goal status: on going 04/21/2024   2. Patient will demonstrate independent use of home exercise program to facilitate ability to maintain/progress functional gains from skilled physical therapy services.  Goal status: on going 04/21/2024   3. Patient will demonstrate Patient specific functional scale avg > or = 6.6 to indicate reduced disability due to condition.   Goal status: on going 04/21/2024   4.  Patient will demonstrate bil LE MMT by >/= 10 lbs using HHD with knee flexion and extension throughout to faciltiate usual transfers, stairs, squatting at PLOF for daily life.   Goal status:Met for lbs number extension.    5.  Patient will demonstrate up and down 1 flight of stairs with single hand rail with no pain noted in bilateral knees.  Goal status:  Met 04/14/24   6.  Pt will be able to perform full squat using correct body mechanics with pain of </= 2/10.  Goal status: on going 04/21/2024      PLAN:  PT FREQUENCY: 1-2x/week  PT DURATION: 10 weeks  PLANNED INTERVENTIONS: Can include 02853- PT Re-evaluation, 97110-Therapeutic exercises, 97530- Therapeutic activity, 97112- Neuromuscular re-education, 97535- Self Care, 97140- Manual therapy, [phone]- Gait training, [phone]- Orthotic Fit/training, [phone]- Canalith repositioning, [id number]- Aquatic Therapy, [phone]- Electrical stimulation (unattended), [id number] Physical performance testing, 97016- Vasopneumatic device, [id number]- Ultrasound, [id number]- Traction (mechanical), [id number]- Ionotophoresis 4mg /ml Dexamethasone,  20560 - Needle insertion w/o injection 1 or 2 muscles, 20561 - Needle insertion w/o injection 3 or more muscles.   Patient/Family education, Balance training, Stair training, Taping, Dry Needling, Joint mobilization, Joint manipulation, Spinal manipulation, Spinal mobilization, Scar mobilization, Vestibular training, Visual/preceptual remediation/compensation, DME instructions, Cryotherapy, and Moist heat.  All performed as medically necessary.  All included unless contraindicated  PLAN FOR NEXT SESSION: Dynamic, compliant surface balance progressions.     Ozell Silvan, PT, DPT, OCS, ATC 05/07/24  8:41 AM

## 2024-05-07 ENCOUNTER — Ambulatory Visit (INDEPENDENT_AMBULATORY_CARE_PROVIDER_SITE_OTHER): Admitting: Rehabilitative and Restorative Service Providers"

## 2024-05-07 ENCOUNTER — Encounter: Payer: Self-pay | Admitting: Rehabilitative and Restorative Service Providers"

## 2024-05-07 DIAGNOSIS — R600 Localized edema: Secondary | ICD-10-CM | POA: Diagnosis not present

## 2024-05-07 DIAGNOSIS — M6281 Muscle weakness (generalized): Secondary | ICD-10-CM

## 2024-05-07 DIAGNOSIS — R262 Difficulty in walking, not elsewhere classified: Secondary | ICD-10-CM | POA: Diagnosis not present

## 2024-05-07 DIAGNOSIS — M25561 Pain in right knee: Secondary | ICD-10-CM | POA: Diagnosis not present

## 2024-05-07 DIAGNOSIS — G8929 Other chronic pain: Secondary | ICD-10-CM

## 2024-05-07 DIAGNOSIS — M25562 Pain in left knee: Secondary | ICD-10-CM

## 2024-05-12 ENCOUNTER — Encounter: Payer: Self-pay | Admitting: Rehabilitative and Restorative Service Providers"

## 2024-05-12 ENCOUNTER — Ambulatory Visit (INDEPENDENT_AMBULATORY_CARE_PROVIDER_SITE_OTHER): Admitting: Rehabilitative and Restorative Service Providers"

## 2024-05-12 DIAGNOSIS — G8929 Other chronic pain: Secondary | ICD-10-CM

## 2024-05-12 DIAGNOSIS — R262 Difficulty in walking, not elsewhere classified: Secondary | ICD-10-CM | POA: Diagnosis not present

## 2024-05-12 DIAGNOSIS — M25561 Pain in right knee: Secondary | ICD-10-CM | POA: Diagnosis not present

## 2024-05-12 DIAGNOSIS — M6281 Muscle weakness (generalized): Secondary | ICD-10-CM

## 2024-05-12 DIAGNOSIS — R600 Localized edema: Secondary | ICD-10-CM | POA: Diagnosis not present

## 2024-05-12 DIAGNOSIS — M25562 Pain in left knee: Secondary | ICD-10-CM

## 2024-05-12 NOTE — Therapy (Signed)
 OUTPATIENT PHYSICAL THERAPY TREATMENT   Patient Name: Zachary Fischer MRN: [id number] DOB:June 17, 1981, 43 y.o., male Today's Date: 05/12/2024  END OF SESSION:  PT End of Session - 05/12/24 0807     Visit Number 8    Number of Visits 16    Date for PT Re-Evaluation 05/22/24    Authorization Type [employer] and [employer]    Progress Note Due on Visit 10    PT Start Time 0801    PT Stop Time 0840    PT Time Calculation (min) 39 min    Activity Tolerance Patient limited by pain    Behavior During Therapy [facility] for tasks assessed/performed                 Past Medical History:  Diagnosis Date   Allergy    Past Surgical History:  Procedure Laterality Date   ESOPHAGOGASTRODUODENOSCOPY (EGD) WITH PROPOFOL  N/A 05/13/2019   Procedure: ESOPHAGOGASTRODUODENOSCOPY (EGD) WITH PROPOFOL ;  Surgeon: Therisa Bi, MD;  Location: [facility] ENDOSCOPY;  Service: Gastroenterology;  Laterality: N/A;   NECK SURGERY     08/19 lipoma    Patient Active Problem List   Diagnosis Date Noted   Complete tear of ligament of thumb 12/10/2023   Left thumb sprain 11/26/2023   Pain in right knee 11/08/2023   Esophageal obstruction 05/13/2019   Routine general medical examination at a health care facility 05/29/2013    PCP: Duanne Butler DASEN, MD   REFERRING PROVIDER: Jerri Kay HERO, MD   REFERRING DIAG:  Diagnosis  M25.561,M25.562,G89.29 (ICD-10-CM) - Chronic pain of both knees    THERAPY DIAG:  Bilateral chronic knee pain  Muscle weakness (generalized)  Difficulty in walking, not elsewhere classified  Localized edema  Rationale for Evaluation and Treatment: Rehabilitation  ONSET DATE: Pt reporting pain has been ongoing for years and gradually wosened   SUBJECTIVE:   SUBJECTIVE STATEMENT: Pt indicated having increase in knee pain and just general body soreness following drill work this weekend.  Reported higher pains noted half way and after drill work.  Reported week was good prior to drill  work.   PERTINENT HISTORY: Neck surgery -tumor removal   PAIN:  NPRS scale: 7/10.  Pain location: bilateral knees Pain description: achy Aggravating factors: walking up stairs, transfers after sitting prolonged.  Relieving factors: Tylenol , Advil , Icy-hot   PRECAUTIONS: None   WEIGHT BEARING RESTRICTIONS: No  FALLS:  Has patient fallen in last 6 months? 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 Possible scarring or localized nodular synovitis in the prefemoral fat without associated osseous erosion. 2. No other significant osseous findings are identified. The menisci, cruciate and collateral ligaments are intact. 3. No acute osseous findings.  PATIENT SURVEYS:  Patient-Specific Activity Scoring Scheme  0 represents "unable to perform." 10 represents "able to perform at prior level. 0 1 2 3 4 5 6 7 8 9  10 (Date and Score)   Activity Eval  03/23/24  04/14/24  1. running  4 4   2. Walking up flights of steps  5 8   3. Marching for [employer] purposes 5 3  4.  5.    Score 4.6 5   Total score = sum of the activity scores/number of activities Minimum detectable change (90%CI) for average score = 2 points Minimum detectable change (90%CI) for single activity score = 3 points  COGNITION: 03/23/2024 Overall cognitive status: WFL    SENSATION: 03/23/2024 [facility]  MUSCLE LENGTH 04/28/2024: Positive Rt thomas for hip flexor tightness with pain  EDEMA:  03/23/2024 Circumferential: Rt knee: 40 centimeters, Left knee: 39.5 centimeters   LOWER EXTREMITY ROM:   ROM  Right Eval 03/23/24 Supine active Left Eval 03/23/24 Supine  active  Hip flexion    Hip extension    Hip abduction    Hip adduction    Hip internal rotation    Hip external rotation    Knee flexion 124 c pain 126  c pain  Knee extension 0 0  Ankle dorsiflexion    Ankle plantarflexion        Hamstring supine, opposite knee bent 55 52   (Blank rows = not tested)  LOWER EXTREMITY MMT:  MMT Right Eval 03/23/24  Left Eval 03/23/24 Right 05/05/2024 Left 05/05/2024  Hip flexion      Hip extension      Hip abduction      Hip adduction      Hip internal rotation      Hip external rotation      Knee flexion 59.5 lbs 65.2 lbs    Knee extension 70.7 lbs 65.9 lbs 5/5 81, 83 lbs 5/5 94, 91  Ankle dorsiflexion      Ankle plantarflexion      Ankle inversion      Ankle eversion       (Blank rows = not tested)    FUNCTIONAL TESTS:  04/21/2024: Rt SLS: 30 Seconds Lt SLS: 15 seconds  03/23/24:  5 time sit to stand: 19.30 seconds no UE support  GAIT: 05/12/2024: noted antalgic gait with reduced stance on Lt knee.   03/23/2024 Distance walked: clinic distances Assistive device utilized: None Level of assistance: Complete Independence Comments: antalgic, wide BOS, decreased terminal knee extension                                                                                                                                                                        TODAY'S TREATMENT  DATE: 05/12/2024 Therex: Recumbent bike seat 8 lvl 3 10 mins for ROM, pain relief with movement.  Seated SLR with hip abduction/adduction movement over kettle bell x 15, performed bilaterally  Attempted wall squat hold but pain in Lt knee noted. Leg press double leg 75 lb x 20 with slow lowering focus  Leg press single leg 43 lbs x 20 - bilaterally    Neuro Re-ed (balance improvements, muscle activation, control/coordination  improvements) Standing small pball quad set press reactive hold 5 sec x 15, performed bilaterally  Tandem stance 1 min x 2 bilateral on foam with occasional HHA on bar.  SLS on foam with contralateral leg corner touching x 6 each, performed bilaterally     TODAY'S TREATMENT                                                                          DATE: 05/06/2024 Therex: UBE LE only seat 11, aerobic with interval training :15 seconds up to 100-110 rpm each minute starting at 2 mins through 8 mins, 10 mins total.   Lvl 4.0.  Wall sit hold in approx. 50 deg knee flexion to fatigue 1 min, 45 seconds, 45 seconds  TherActivity (to improve squat movement, transfers, stair control) TRX double leg squat x 20 TRX single leg reverse lunge slider to tolerance 2 x 15 bilateral, performed with slow control focus (had pain complaint on last 2 reps with Lt knee with Lt leg in back   Neuro Re-ed (muscle activation, balance improvements, stability Tandem ambulation on foam fwd/back 6 ft x 5 each way  SLS with slight knee flexion on foam 1 min x 1 bilateral  Fitter rocker board fwd/back light touch control for ankle strategy x 25 each way, 1 min hover balance attempts with occasional to moderate HHA on bar.      TODAY'S TREATMENT                                                                          DATE: 04/28/2024 Therex: Recumbent bike lvl 3 10 mins  Knee extension machine double leg up, single leg lowering - movement to approx. 15 deg knee extension to avoid knee pain.  15 lbs 2 x 10  Additional time spent in review of techniques of intervention and continued adjustment in HEP.    TherActivity Leg press double leg 87 lbs 2 x 15 single leg 37 lbs 2 x 15 bilaterally  TRX supported double leg squat with focus on limiting anterior tibia movement 2 x 15      TODAY'S TREATMENT  DATE: 04/21/2024 Therex: UBE LE only lvl 4.0 8  mins with 15 seconds faster interval top of each minute Knee extension machine double leg up , single leg lowering 15-90 deg 20 lbs 2 x 10 bilateral  Seated quad set with SLR with lift up and over laterally over kettle bell approx. 6 inch height 2 x 10 bilateral TRX supported double leg squat with emphasis on no tibial anterior displacement - 2 x 15   Neuro Re-ed Lateral stepping reactive blazepod 3 lights, 2 second time out 30 sec x 3 bilateral with 15 sec rest breaks (advised shortened step to avoid Lt knee pain).  SLS on foam with reactive light touching 4 lights in a square 30 sec x 1 bilateral (stopped due to Lt leg pain)  SLS Rt leg on floor 30 seconds, Lt leg 15 seconds x 2   PATIENT EDUCATION:  Education details: HEP, POC Person educated: Patient Education method: Programmer, multimedia, Demonstration, Verbal cues, and Handouts Education comprehension: verbalized understanding, returned demonstration, and verbal cues required  HOME EXERCISE PROGRAM: Access Code: [email or web address] URL: [email or web address] Date: 04/28/2024 Prepared by: Ozell Silvan  Exercises - Supine Bridge  - 2 x daily - 7 x weekly - 2 sets - 10 reps - Seated Hamstring Stretch  - 2 x daily - 7 x weekly - 3 reps - 30 seconds hold - Sitting Knee Extension with Resistance  - 2 x daily - 7 x weekly - 2 sets - 10 reps - Seated Quad Set  - 3-5 x daily - 7 x weekly - 1 sets - 10 reps - 5 hold - Seated SLR  - 1-2 x daily - 7 x weekly - 1-2 sets - 10-15 reps - 2 hold - Modified Thomas Stretch  - 2-3 x daily - 7 x weekly - 1 sets - 3-5 reps - 15-30 hold - Prone Quadriceps Stretch with Strap  - 2-3 x daily - 7 x weekly - 1 sets - 3-5 reps - 15-30 hold  ASSESSMENT:  CLINICAL IMPRESSION: Symptoms worsened after drill work this weekend with presentation of increased irritability within clinic.  Adjustments to intervention required to avoid exacerbation of knee pain symptoms in activity.  Held any testing today due to  symptom presentation.   OBJECTIVE IMPAIRMENTS: difficulty walking, decreased ROM, decreased strength, increased edema, impaired flexibility, and pain.   ACTIVITY LIMITATIONS: lifting, bending, standing, squatting, and stairs  PARTICIPATION LIMITATIONS: community activity and occupation  PERSONAL FACTORS: 1 comorbidity: see PMH are also affecting patient's functional outcome.   REHAB POTENTIAL: Excellent  CLINICAL DECISION MAKING: Stable/uncomplicated  EVALUATION COMPLEXITY: Low   GOALS: Goals reviewed with patient? Yes  SHORT TERM GOALS: (target date for Short term goals are 3 weeks 04/13/2024)   1.  Patient will demonstrate independent use of home exercise program to maintain progress from in clinic treatments.  Goal status: Met   LONG TERM GOALS: (target dates for all long term goals are 8 weeks  05/22/2024 )   1. Patient will demonstrate/report pain at worst less than or equal to 2/10 to facilitate minimal limitation in daily activity secondary to pain symptoms.  Goal status: on going 04/21/2024   2. Patient will demonstrate independent use of home exercise program to facilitate ability to maintain/progress functional gains from skilled physical therapy services.  Goal status: on going 04/21/2024   3. Patient will demonstrate Patient specific functional scale avg > or = 6.6 to indicate reduced disability due to condition.   Goal status: on going 04/21/2024  4.  Patient will demonstrate bil LE MMT by >/= 10 lbs using HHD with knee flexion and extension throughout to faciltiate usual transfers, stairs, squatting at PLOF for daily life.   Goal status:Met for lbs number extension.    5.  Patient will demonstrate up and down 1 flight of stairs with single hand rail with no pain noted in bilateral knees.  Goal status: Met 04/14/24   6.  Pt will be able to perform full squat using correct body mechanics with pain of </= 2/10.  Goal status: on going 04/21/2024       PLAN:  PT FREQUENCY: 1-2x/week  PT DURATION: 10 weeks  PLANNED INTERVENTIONS: Can include 02853- PT Re-evaluation, 97110-Therapeutic exercises, 97530- Therapeutic activity, 97112- Neuromuscular re-education, 97535- Self Care, 97140- Manual therapy, [phone]- Gait training, [phone]- Orthotic Fit/training, [phone]- Canalith repositioning, [id number]- Aquatic Therapy, [phone]- Electrical stimulation (unattended), [id number] Physical performance testing, 97016- Vasopneumatic device, [id number]- Ultrasound, [id number]- Traction (mechanical), [id number]- Ionotophoresis 4mg /ml Dexamethasone,  20560 - Needle insertion w/o injection 1 or 2 muscles, 20561 - Needle insertion w/o injection 3 or more muscles.   Patient/Family education, Balance training, Stair training, Taping, Dry Needling, Joint mobilization, Joint manipulation, Spinal manipulation, Spinal mobilization, Scar mobilization, Vestibular training, Visual/preceptual remediation/compensation, DME instructions, Cryotherapy, and Moist heat.  All performed as medically necessary.  All included unless contraindicated  PLAN FOR NEXT SESSION: Resume WB strengthening as able.  Recheck leg strength.    Ozell Silvan, PT, DPT, OCS, ATC 05/12/24  8:38 AM

## 2024-05-14 ENCOUNTER — Ambulatory Visit (INDEPENDENT_AMBULATORY_CARE_PROVIDER_SITE_OTHER): Admitting: Rehabilitative and Restorative Service Providers"

## 2024-05-14 ENCOUNTER — Encounter: Payer: Self-pay | Admitting: Rehabilitative and Restorative Service Providers"

## 2024-05-14 DIAGNOSIS — M6281 Muscle weakness (generalized): Secondary | ICD-10-CM

## 2024-05-14 DIAGNOSIS — M25561 Pain in right knee: Secondary | ICD-10-CM

## 2024-05-14 DIAGNOSIS — M25562 Pain in left knee: Secondary | ICD-10-CM

## 2024-05-14 DIAGNOSIS — R600 Localized edema: Secondary | ICD-10-CM

## 2024-05-14 DIAGNOSIS — R262 Difficulty in walking, not elsewhere classified: Secondary | ICD-10-CM

## 2024-05-14 DIAGNOSIS — G8929 Other chronic pain: Secondary | ICD-10-CM

## 2024-05-14 NOTE — Therapy (Signed)
 OUTPATIENT PHYSICAL THERAPY TREATMENT   Patient Name: Zachary Fischer MRN: [id number] DOB:1981/02/14, 43 y.o., male Today's Date: 05/14/2024  END OF SESSION:  PT End of Session - 05/14/24 0811     Visit Number 9    Number of Visits 16    Date for PT Re-Evaluation 05/22/24    Authorization Type [employer] and [employer]    PT Start Time 0808    PT Stop Time 0847    PT Time Calculation (min) 39 min    Activity Tolerance Patient tolerated treatment well    Behavior During Therapy [facility] for tasks assessed/performed                  Past Medical History:  Diagnosis Date   Allergy    Past Surgical History:  Procedure Laterality Date   ESOPHAGOGASTRODUODENOSCOPY (EGD) WITH PROPOFOL  N/A 05/13/2019   Procedure: ESOPHAGOGASTRODUODENOSCOPY (EGD) WITH PROPOFOL ;  Surgeon: Therisa Bi, MD;  Location: [facility] ENDOSCOPY;  Service: Gastroenterology;  Laterality: N/A;   NECK SURGERY     08/19 lipoma    Patient Active Problem List   Diagnosis Date Noted   Complete tear of ligament of thumb 12/10/2023   Left thumb sprain 11/26/2023   Pain in right knee 11/08/2023   Esophageal obstruction 05/13/2019   Routine general medical examination at a health care facility 05/29/2013    PCP: Duanne Butler DASEN, MD   REFERRING PROVIDER: Jerri Kay HERO, MD   REFERRING DIAG:  Diagnosis  M25.561,M25.562,G89.29 (ICD-10-CM) - Chronic pain of both knees    THERAPY DIAG:  Bilateral chronic knee pain  Muscle weakness (generalized)  Difficulty in walking, not elsewhere classified  Localized edema  Rationale for Evaluation and Treatment: Rehabilitation  ONSET DATE: Pt reporting pain has been ongoing for years and gradually wosened   SUBJECTIVE:   SUBJECTIVE STATEMENT: Pt indicated no pain upon arrival this morning with walking.  Pt indicated having some pain yesterday but less than weekend.  Pt indicated having a sharp pain at times on Rt knee proximal to quad attachment on patella.   PERTINENT  HISTORY: Neck surgery -tumor removal   PAIN:  NPRS scale: 0/10 upon arrival.  Pain location: bilateral knees Pain description: achy Aggravating factors: walking up stairs, transfers after sitting prolonged.  Relieving factors: Tylenol , Advil , Icy-hot   PRECAUTIONS: None   WEIGHT BEARING RESTRICTIONS: No  FALLS:  Has patient fallen in last 6 months? 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 Possible scarring or localized nodular synovitis in the prefemoral fat without associated osseous erosion. 2. No other significant osseous findings are identified. The menisci, cruciate and collateral ligaments are intact. 3. No acute osseous findings.  PATIENT SURVEYS:  Patient-Specific Activity Scoring Scheme  0 represents "unable to perform." 10 represents "able to perform at prior level. 0 1 2 3 4 5 6 7 8 9  10 (Date and Score)   Activity Eval  03/23/24  04/14/24  1. running  4 4   2. Walking up flights of steps  5 8   3. Marching for [employer] purposes 5 3  4.    5.  Score 4.6 5   Total score = sum of the activity scores/number of activities Minimum detectable change (90%CI) for average score = 2 points Minimum detectable change (90%CI) for single activity score = 3 points  COGNITION: 03/23/2024 Overall cognitive status: WFL    SENSATION: 03/23/2024 [facility]  MUSCLE LENGTH 04/28/2024: Positive Rt thomas for hip flexor tightness with pain  EDEMA:  03/23/2024 Circumferential: Rt knee: 40 centimeters, Left knee: 39.5 centimeters   LOWER EXTREMITY ROM:   ROM  Right Eval 03/23/24 Supine active Left Eval 03/23/24 Supine  active  Hip flexion    Hip extension    Hip abduction    Hip adduction    Hip internal rotation    Hip external rotation    Knee flexion 124 c pain 126  c pain  Knee extension 0 0  Ankle dorsiflexion    Ankle plantarflexion        Hamstring supine, opposite knee bent 55 52   (Blank rows = not tested)  LOWER EXTREMITY MMT:  MMT Right Eval 03/23/24  Left Eval 03/23/24 Right 05/05/2024 Left 05/05/2024 Right 05/14/2024 Left  05/14/2024  Hip flexion        Hip extension        Hip abduction        Hip adduction        Hip internal rotation        Hip external rotation        Knee flexion 59.5 lbs 65.2 lbs      Knee extension 70.7 lbs 65.9 lbs 5/5 81, 83 lbs 5/5 94, 91 5/5 95, 88 lbs 5/5 89, 90 lbs  Ankle dorsiflexion        Ankle plantarflexion        Ankle inversion        Ankle eversion         (Blank rows = not tested)    FUNCTIONAL TESTS:  04/21/2024: Rt SLS: 30 Seconds Lt SLS: 15 seconds  03/23/24:  5 time sit to stand: 19.30 seconds no UE support  GAIT: 05/12/2024: noted antalgic gait with reduced stance on Lt knee.   03/23/2024 Distance walked: clinic distances Assistive device utilized: None Level of assistance: Complete Independence Comments: antalgic, wide BOS, decreased terminal knee extension                                                                                                                                                                        TODAY'S TREATMENT  DATE: 05/14/2024 Therex: UBE LE only for ROM and muscle profusion lvl 3.5 8 mins, seat 13 Lateral stepping green band with partial squat to tolerance 15 ft x 4 each way  Monster walk fwd/back green band around lower legs 15 ft x 4 each way  TRX double leg squat slow lowering focus 2 x 20  Machine knee extension double leg up, single leg  lowering slowly 10 lbs x 15 bilateral   Neuro Re-ed (balance control, coordination) Y balance reach with contralateral leg light touch to ground x 8 each way, performed bilaterally  SLS on foam with reactive blazepod light touching 4 square 30 sec x 3 bialteral  SLS hip abduction pulsing green band around lower legs 2 x 20 bilateral    TODAY'S TREATMENT                                                                          DATE: 05/12/2024 Therex: Recumbent bike seat 8 lvl 3 10 mins for ROM, pain relief with movement.  Seated SLR with hip abduction/adduction movement over kettle bell x 15, performed bilaterally  Attempted wall squat hold but pain in Lt knee noted. Leg press double leg 75 lb x 20 with slow lowering focus  Leg press single leg 43 lbs x 20 - bilaterally    Neuro Re-ed (balance improvements, muscle activation, control/coordination improvements) Standing small pball quad set press reactive hold 5 sec x 15, performed bilaterally  Tandem stance 1 min x 2 bilateral on foam with occasional HHA on bar.  SLS on foam with contralateral leg corner touching x 6 each, performed bilaterally    TODAY'S TREATMENT                                                                          DATE: 05/06/2024 Therex: UBE LE only seat 11, aerobic with interval training :15 seconds up to 100-110 rpm each minute starting at 2 mins through 8 mins, 10 mins total.   Lvl 4.0.  Wall sit hold in approx. 50 deg knee flexion to fatigue 1 min, 45 seconds, 45 seconds  TherActivity (to improve squat movement, transfers, stair control) TRX double leg squat x 20 TRX single leg reverse lunge slider to tolerance 2 x 15 bilateral, performed with slow control focus (had pain complaint on last 2 reps with Lt knee with Lt leg in back   Neuro Re-ed (muscle activation, balance improvements, stability Tandem ambulation on foam fwd/back 6 ft x 5 each way  SLS with slight knee flexion on foam 1 min x 1 bilateral   Fitter rocker board fwd/back light touch control for ankle strategy x 25 each way, 1 min hover balance attempts with occasional to moderate HHA on bar.    TODAY'S TREATMENT  DATE: 04/28/2024 Therex: Recumbent bike lvl 3 10 mins  Knee extension machine double leg up, single leg lowering - movement to approx. 15 deg knee extension to avoid knee pain.  15 lbs 2 x 10  Additional time spent in review of techniques of intervention and continued adjustment in HEP.    TherActivity Leg press double leg 87 lbs 2 x 15 single leg 37 lbs 2 x 15 bilaterally  TRX supported double leg squat with focus on limiting anterior tibia movement 2 x 15    PATIENT EDUCATION:  Education details: HEP, POC Person educated: Patient Education method: Programmer, multimedia, Demonstration, Verbal cues, and Handouts Education comprehension: verbalized understanding, returned demonstration, and verbal cues required  HOME EXERCISE PROGRAM: Access Code: [email or web address] URL: [email or web address] Date: 04/28/2024 Prepared by: Ozell Silvan  Exercises - Supine Bridge  - 2 x daily - 7 x weekly - 2 sets - 10 reps - Seated Hamstring Stretch  - 2 x daily - 7 x weekly - 3 reps - 30 seconds hold - Sitting Knee Extension with Resistance  - 2 x daily - 7 x weekly - 2 sets - 10 reps - Seated Quad Set  - 3-5 x daily - 7 x weekly - 1 sets - 10 reps - 5 hold - Seated SLR  - 1-2 x daily - 7 x weekly - 1-2 sets - 10-15 reps - 2 hold - Modified Thomas Stretch  - 2-3 x daily - 7 x weekly - 1 sets - 3-5 reps - 15-30 hold - Prone Quadriceps Stretch with Strap  - 2-3 x daily - 7 x weekly - 1 sets - 3-5 reps - 15-30 hold  ASSESSMENT:  CLINICAL IMPRESSION: Discussed use of band for standing hip strengthening (side stepping, monster walks, hip abduction pulse kicks).  Strength check for quads showed continued progression.  Pt to benefit from continued functional WB  strengthening and adaptions in exercise to avoid pain increases.  Able to include return to some WB activity today without worsened symptoms.   OBJECTIVE IMPAIRMENTS: difficulty walking, decreased ROM, decreased strength, increased edema, impaired flexibility, and pain.   ACTIVITY LIMITATIONS: lifting, bending, standing, squatting, and stairs  PARTICIPATION LIMITATIONS: community activity and occupation  PERSONAL FACTORS: 1 comorbidity: see PMH are also affecting patient's functional outcome.   REHAB POTENTIAL: Excellent  CLINICAL DECISION MAKING: Stable/uncomplicated  EVALUATION COMPLEXITY: Low   GOALS: Goals reviewed with patient? Yes  SHORT TERM GOALS: (target date for Short term goals are 3 weeks 04/13/2024)   1.  Patient will demonstrate independent use of home exercise program to maintain progress from in clinic treatments.  Goal status: Met   LONG TERM GOALS: (target dates for all long term goals are 8 weeks  05/22/2024 )   1. Patient will demonstrate/report pain at worst less than or equal to 2/10 to facilitate minimal limitation in daily activity secondary to pain symptoms.  Goal status: on going 04/21/2024   2. Patient will demonstrate independent use of home exercise program to facilitate ability to maintain/progress functional gains from skilled physical therapy services.  Goal status: on going 04/21/2024   3. Patient will demonstrate Patient specific functional scale avg > or = 6.6 to indicate reduced disability due to condition.   Goal status: on going 04/21/2024   4.  Patient will demonstrate bil LE MMT by >/= 10 lbs using HHD with knee flexion and extension throughout to faciltiate usual transfers, stairs, squatting at PLOF for daily life.   Goal status:Met for lbs number  extension.    5.  Patient will demonstrate up and down 1 flight of stairs with single hand rail with no pain noted in bilateral knees.  Goal status: Met 04/14/24   6.  Pt will be able to  perform full squat using correct body mechanics with pain of </= 2/10.  Goal status: on going 04/21/2024      PLAN:  PT FREQUENCY: 1-2x/week  PT DURATION: 10 weeks  PLANNED INTERVENTIONS: Can include 02853- PT Re-evaluation, 97110-Therapeutic exercises, 97530- Therapeutic activity, 97112- Neuromuscular re-education, 97535- Self Care, 97140- Manual therapy, [phone]- Gait training, [phone]- Orthotic Fit/training, [phone]- Canalith repositioning, [id number]- Aquatic Therapy, [phone]- Electrical stimulation (unattended), [id number] Physical performance testing, 97016- Vasopneumatic device, [id number]- Ultrasound, [id number]- Traction (mechanical), [id number]- Ionotophoresis 4mg /ml Dexamethasone,  20560 - Needle insertion w/o injection 1 or 2 muscles, 20561 - Needle insertion w/o injection 3 or more muscles.   Patient/Family education, Balance training, Stair training, Taping, Dry Needling, Joint mobilization, Joint manipulation, Spinal manipulation, Spinal mobilization, Scar mobilization, Vestibular training, Visual/preceptual remediation/compensation, DME instructions, Cryotherapy, and Moist heat.  All performed as medically necessary.  All included unless contraindicated  PLAN FOR NEXT SESSION:  Continued dynamic balance, hip/quad strengthening in WB   Recert date upcoming on 05/22/2024 or 1st visit after.    Ozell Silvan, PT, DPT, OCS, ATC 05/14/24  8:47 AM

## 2024-05-19 ENCOUNTER — Encounter: Payer: Self-pay | Admitting: Physical Therapy

## 2024-05-19 ENCOUNTER — Ambulatory Visit (INDEPENDENT_AMBULATORY_CARE_PROVIDER_SITE_OTHER): Admitting: Physical Therapy

## 2024-05-19 DIAGNOSIS — R262 Difficulty in walking, not elsewhere classified: Secondary | ICD-10-CM

## 2024-05-19 DIAGNOSIS — M25561 Pain in right knee: Secondary | ICD-10-CM | POA: Diagnosis not present

## 2024-05-19 DIAGNOSIS — R600 Localized edema: Secondary | ICD-10-CM

## 2024-05-19 DIAGNOSIS — G8929 Other chronic pain: Secondary | ICD-10-CM

## 2024-05-19 DIAGNOSIS — M6281 Muscle weakness (generalized): Secondary | ICD-10-CM | POA: Diagnosis not present

## 2024-05-19 DIAGNOSIS — M25562 Pain in left knee: Secondary | ICD-10-CM

## 2024-05-19 NOTE — Therapy (Signed)
 OUTPATIENT PHYSICAL THERAPY TREATMENT   Patient Name: Zachary Fischer MRN: [id number] DOB:01-02-1981, 43 y.o., male Today's Date: 05/19/2024  END OF SESSION:  PT End of Session - 05/19/24 0819     Visit Number 10    Number of Visits 16    Date for PT Re-Evaluation 05/22/24    Authorization Type [employer] and [employer]    Progress Note Due on Visit 11    PT Start Time 0814    PT Stop Time 0844    PT Time Calculation (min) 30 min    Activity Tolerance Patient tolerated treatment well    Behavior During Therapy [facility] for tasks assessed/performed                   Past Medical History:  Diagnosis Date   Allergy    Past Surgical History:  Procedure Laterality Date   ESOPHAGOGASTRODUODENOSCOPY (EGD) WITH PROPOFOL  N/A 05/13/2019   Procedure: ESOPHAGOGASTRODUODENOSCOPY (EGD) WITH PROPOFOL ;  Surgeon: Therisa Bi, MD;  Location: [facility] ENDOSCOPY;  Service: Gastroenterology;  Laterality: N/A;   NECK SURGERY     08/19 lipoma    Patient Active Problem List   Diagnosis Date Noted   Complete tear of ligament of thumb 12/10/2023   Left thumb sprain 11/26/2023   Pain in right knee 11/08/2023   Esophageal obstruction 05/13/2019   Routine general medical examination at a health care facility 05/29/2013    PCP: Duanne Butler DASEN, MD   REFERRING PROVIDER: Jerri Kay HERO, MD   REFERRING DIAG:  Diagnosis  M25.561,M25.562,G89.29 (ICD-10-CM) - Chronic pain of both knees    THERAPY DIAG:  Bilateral chronic knee pain  Muscle weakness (generalized)  Difficulty in walking, not elsewhere classified  Localized edema  Rationale for Evaluation and Treatment: Rehabilitation  ONSET DATE: Pt reporting pain has been ongoing for years and gradually wosened   SUBJECTIVE:   SUBJECTIVE STATEMENT: Pt arriving today reporting 4-5/10 in bil knees. Pt stating a new onset of Rt side low back pain which can be stabbing at times and 9/10 pain. Pt stating he believes it all started during his  drills last weekend. Pt stating he took over the counter pain meds, but almost ended up in Urgent Care yesterday.   PERTINENT HISTORY: Neck surgery -tumor removal   PAIN:  NPRS scale: 4-5/10 upon arrival in bilateral knees, 9/10 pain in his right side low back Pain location: bilateral knees Pain description: achy Aggravating factors: walking up stairs, transfers after sitting prolonged.  Relieving factors: Tylenol , Advil , Icy-hot   PRECAUTIONS: None   WEIGHT BEARING RESTRICTIONS: No  FALLS:  Has patient fallen in last 6 months? 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 Possible scarring or localized nodular synovitis in the prefemoral fat without associated osseous erosion. 2. No other significant osseous findings are identified. The menisci, cruciate and collateral ligaments are intact. 3. No acute osseous findings.  PATIENT SURVEYS:  Patient-Specific Activity Scoring Scheme  0 represents "unable to perform." 10 represents "able to perform at prior level. 0 1 2 3 4 5 6 7 8 9  10 (Date and Score)  Activity Eval  03/23/24  04/14/24 05/19/24  1. running  4 4  3   2. Walking up flights of steps  5 8  5   3. Marching for [employer] purposes 5 3 5   4.     5.     Score 4.6 5 4.3   Total score = sum of the activity scores/number of activities Minimum detectable change (90%CI) for average score = 2 points Minimum detectable change (90%CI) for single activity score = 3 points  COGNITION: 03/23/2024 Overall cognitive status: WFL    SENSATION: 03/23/2024 [facility]  MUSCLE  LENGTH 04/28/2024: Positive Rt thomas for hip flexor tightness with pain  EDEMA:  03/23/2024 Circumferential: Rt knee: 40 centimeters, Left knee: 39.5 centimeters   LOWER EXTREMITY ROM:   ROM Right Eval 03/23/24 Supine active Left Eval 03/23/24 Supine  active Rt / Left 05/19/24 Supine  active  Hip flexion     Hip extension     Hip abduction     Hip adduction     Hip internal rotation     Hip external rotation     Knee flexion 124 c pain 126  c pain 128 / 125 No pain  Knee extension 0 0 0 / 0   Ankle dorsiflexion     Ankle plantarflexion          Hamstring supine, opposite knee bent 55 52 70 / 60  Pain on the right in pt's low back, QL   (Blank rows = not tested)  LOWER EXTREMITY MMT:  MMT Right Eval 03/23/24  Left Eval 03/23/24 Right 05/05/2024 Left 05/05/2024 Right 05/14/2024 Left  05/14/2024  Hip flexion        Hip extension        Hip abduction        Hip adduction        Hip internal rotation        Hip external rotation        Knee flexion 59.5 lbs 65.2 lbs      Knee extension 70.7 lbs 65.9 lbs 5/5 81, 83 lbs 5/5 94, 91 5/5 95, 88 lbs 5/5 89, 90 lbs  Ankle dorsiflexion        Ankle plantarflexion        Ankle inversion        Ankle eversion         (Blank rows = not tested)    FUNCTIONAL TESTS:  05/19/2024: Rt SLS: 9 Seconds due to low back pain new onset since last week Lt SLS: 30 seconds and then stopped, no pain noted on left   04/21/2024: Rt SLS: 30 Seconds Lt SLS: 15 seconds  03/23/24:  5 time sit to stand: 19.30 seconds no UE support  GAIT: 05/12/2024: noted antalgic gait with reduced stance on Lt knee.   03/23/2024 Distance walked: clinic distances Assistive device utilized: None Level of assistance: Complete Independence Comments: antalgic, wide BOS, decreased terminal knee extension  TODAY'S TREATMENT                                                                          DATE: 05/19/2024 Therex: See updated ROM, SLS and PSFS above Supine hamstring stretch: x 2 bil LE Supine SKTC:  2 bil holding 60 sec Prone elbow press ups x 60 sec Standing trunk extension elbow on the wall x 5 holding 10 sec Manual  Percussion to pt's lumbar paraspinals and Rt QL Lumbar grade 2-3 PA mobs      TODAY'S TREATMENT                                                                          DATE: 05/14/2024 Therex: UBE LE only for ROM and muscle profusion lvl 3.5 8 mins, seat 13 Lateral stepping green band with partial squat to tolerance 15 ft x 4 each way  Monster walk fwd/back green band around lower legs 15 ft x 4 each way  TRX double leg squat slow lowering focus 2 x 20  Machine knee extension double leg up, single leg lowering slowly 10 lbs x 15 bilateral   Neuro Re-ed (balance control, coordination) Y balance reach with contralateral leg light touch to ground x 8 each way, performed bilaterally  SLS on foam with reactive blazepod light touching 4 square 30 sec x 3 bialteral  SLS hip abduction pulsing green band around lower legs 2 x 20 bilateral    TODAY'S TREATMENT                                                                          DATE: 05/12/2024 Therex: Recumbent bike seat 8 lvl 3 10 mins for ROM, pain relief with movement.  Seated SLR with hip abduction/adduction movement over kettle bell x 15, performed bilaterally  Attempted wall squat hold but pain in Lt knee noted. Leg press double leg 75 lb x 20 with slow lowering focus  Leg press single leg 43 lbs x 20 - bilaterally    Neuro Re-ed (balance improvements, muscle activation, control/coordination improvements) Standing small pball quad set press reactive hold 5 sec x 15, performed bilaterally  Tandem stance 1 min x 2 bilateral on foam with occasional HHA on bar.  SLS on foam with  contralateral leg corner touching x 6 each, performed bilaterally    TODAY'S TREATMENT  DATE: 05/06/2024 Therex: UBE LE only seat 11, aerobic with interval training :15 seconds up to 100-110 rpm each minute starting at 2 mins through 8 mins, 10 mins total.   Lvl 4.0.  Wall sit hold in approx. 50 deg knee flexion to fatigue 1 min, 45 seconds, 45 seconds  TherActivity (to improve squat movement, transfers, stair control) TRX double leg squat x 20 TRX single leg reverse lunge slider to tolerance 2 x 15 bilateral, performed with slow control focus (had pain complaint on last 2 reps with Lt knee with Lt leg in back   Neuro Re-ed (muscle activation, balance improvements, stability Tandem ambulation on foam fwd/back 6 ft x 5 each way  SLS with slight knee flexion on foam 1 min x 1 bilateral  Fitter rocker board fwd/back light touch control for ankle strategy x 25 each way, 1 min hover balance attempts with occasional to moderate HHA on bar.       PATIENT EDUCATION:  Education details: HEP, POC Person educated: Patient Education method: Programmer, multimedia, Demonstration, Verbal cues, and Handouts Education comprehension: verbalized understanding, returned demonstration, and verbal cues required  HOME EXERCISE PROGRAM: Access Code: [email or web address] URL: [email or web address] Date: 04/28/2024 Prepared by: Ozell Silvan  Exercises - Supine Bridge  - 2 x daily - 7 x weekly - 2 sets - 10 reps - Seated Hamstring Stretch  - 2 x daily - 7 x weekly - 3 reps - 30 seconds hold - Sitting Knee Extension with Resistance  - 2 x daily - 7 x weekly - 2 sets - 10 reps - Seated Quad Set  - 3-5 x daily - 7 x weekly - 1 sets - 10 reps - 5 hold - Seated SLR  - 1-2 x daily - 7 x weekly - 1-2 sets - 10-15 reps - 2 hold - Modified Thomas Stretch  - 2-3 x daily - 7 x weekly - 1 sets - 3-5 reps - 15-30 hold - Prone Quadriceps Stretch with  Strap  - 2-3 x daily - 7 x weekly - 1 sets - 3-5 reps - 15-30 hold  ASSESSMENT:  CLINICAL IMPRESSION: Pt arriving today 12 minutes late to his appointment. Pt reporting 4-5/10 pain in bilateral knees. Pt also reporting new onset of Rt side low back pain of 9/10 which is limiting pt's functional mobility. We added some gentle lumbar stretching to pt's HEP. We discussed continuation of skilled PT services with a re-certification/PN at next visit for an additional 6-8 weeks to progress toward LTG's not met.    OBJECTIVE IMPAIRMENTS: difficulty walking, decreased ROM, decreased strength, increased edema, impaired flexibility, and pain.   ACTIVITY LIMITATIONS: lifting, bending, standing, squatting, and stairs  PARTICIPATION LIMITATIONS: community activity and occupation  PERSONAL FACTORS: 1 comorbidity: see PMH are also affecting patient's functional outcome.   REHAB POTENTIAL: Excellent  CLINICAL DECISION MAKING: Stable/uncomplicated  EVALUATION COMPLEXITY: Low   GOALS: Goals reviewed with patient? Yes  SHORT TERM GOALS: (target date for Short term goals are 3 weeks 04/13/2024)   1.  Patient will demonstrate independent use of home exercise program to maintain progress from in clinic treatments.  Goal status: Met   LONG TERM GOALS: (target dates for all long term goals are 8 weeks  05/22/2024 )   1. Patient will demonstrate/report pain at worst less than or equal to 2/10 to facilitate minimal limitation in daily activity secondary to pain symptoms.  Goal status: on going 8/26//2025   2. Patient will demonstrate independent use of home exercise  program to facilitate ability to maintain/progress functional gains from skilled physical therapy services.  Goal status: on going 8/26//2025   3. Patient will demonstrate Patient specific functional scale avg > or = 6.6 to indicate reduced disability due to condition.   Goal status: on going 8/26//2025   4.  Patient will demonstrate bil  LE MMT by >/= 10 lbs using HHD with knee flexion and extension throughout to faciltiate usual transfers, stairs, squatting at PLOF for daily life.   Goal status:Met for lbs number extension.    5.  Patient will demonstrate up and down 1 flight of stairs with single hand rail with no pain noted in bilateral knees.  Goal status: Met 04/14/24   6.  Pt will be able to perform full squat using correct body mechanics with pain of </= 2/10.  Goal status: on going 8/26//2025      PLAN:  PT FREQUENCY: 1-2x/week  PT DURATION: 10 weeks  PLANNED INTERVENTIONS: Can include 02853- PT Re-evaluation, 97110-Therapeutic exercises, 97530- Therapeutic activity, 97112- Neuromuscular re-education, 97535- Self Care, 97140- Manual therapy, [phone]- Gait training, [phone]- Orthotic Fit/training, [phone]- Canalith repositioning, [id number]- Aquatic Therapy, [phone]- Electrical stimulation (unattended), [id number] Physical performance testing, 97016- Vasopneumatic device, [id number]- Ultrasound, [id number]- Traction (mechanical), [id number]- Ionotophoresis 4mg /ml Dexamethasone,  20560 - Needle insertion w/o injection 1 or 2 muscles, 20561 - Needle insertion w/o injection 3 or more muscles.   Patient/Family education, Balance training, Stair training, Taping, Dry Needling, Joint mobilization, Joint manipulation, Spinal manipulation, Spinal mobilization, Scar mobilization, Vestibular training, Visual/preceptual remediation/compensation, DME instructions, Cryotherapy, and Moist heat.  All performed as medically necessary.  All included unless contraindicated  PLAN FOR NEXT SESSION:  Continued dynamic balance, hip/quad strengthening in WB   Recert  next visit, pt  limited this visit due to new onset of LBP on Rt side.    Delon Lunger, PT, MPT 05/19/24 9:12 AM   05/19/24  9:12 AM

## 2024-05-20 ENCOUNTER — Encounter: Payer: Self-pay | Admitting: Orthopaedic Surgery

## 2024-05-20 NOTE — Therapy (Signed)
 OUTPATIENT PHYSICAL THERAPY TREATMENT/RE CERT   Patient Name: Zachary Fischer MRN: [id number] DOB:10-14-1980, 43 y.o., male Today's Date: 05/21/2024  END OF SESSION:  PT End of Session - 05/21/24 1625     Visit Number 11    Number of Visits 16    Date for PT Re-Evaluation 07/02/24    Authorization Type [employer] and [employer]    PT Start Time 1102    PT Stop Time 1140    PT Time Calculation (min) 38 min    Activity Tolerance Patient tolerated treatment well    Behavior During Therapy WFL for tasks assessed/performed                    Past Medical History:  Diagnosis Date   Allergy    Past Surgical History:  Procedure Laterality Date   ESOPHAGOGASTRODUODENOSCOPY (EGD) WITH PROPOFOL  N/A 05/13/2019   Procedure: ESOPHAGOGASTRODUODENOSCOPY (EGD) WITH PROPOFOL ;  Surgeon: Therisa Bi, MD;  Location: [facility] ENDOSCOPY;  Service: Gastroenterology;  Laterality: N/A;   NECK SURGERY     08/19 lipoma    Patient Active Problem List   Diagnosis Date Noted   Complete tear of ligament of thumb 12/10/2023   Left thumb sprain 11/26/2023   Pain in right knee 11/08/2023   Esophageal obstruction 05/13/2019   Routine general medical examination at a health care facility 05/29/2013    PCP: Duanne Butler DASEN, MD   REFERRING PROVIDER: Jerri Kay HERO, MD   REFERRING DIAG:  Diagnosis  M25.561,M25.562,G89.29 (ICD-10-CM) - Chronic pain of both knees    THERAPY DIAG:  Bilateral chronic knee pain  Muscle weakness (generalized)  Difficulty in walking, not elsewhere classified  Rationale for Evaluation and Treatment: Rehabilitation  ONSET DATE: Pt reporting pain has been ongoing for years and gradually wosened   SUBJECTIVE:   SUBJECTIVE STATEMENT: Pt reports Rt side low back pain has decreased since last visit.  PERTINENT HISTORY: Neck surgery -tumor removal   PAIN:  NPRS scale: 4/10 upon arrival in bilateral knees, 6/10 pain in his right side low back today Pain location:  bilateral knees Pain description: achy Aggravating factors: walking up stairs, transfers after sitting prolonged.  Relieving factors: Tylenol , Advil , Icy-hot   PRECAUTIONS: None   WEIGHT BEARING RESTRICTIONS: No  FALLS:  Has patient fallen in last 6 months? 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 Possible scarring or localized nodular synovitis in the prefemoral fat without associated osseous erosion. 2. No other significant osseous findings are identified. The menisci, cruciate and collateral ligaments are intact. 3. No acute osseous findings.  PATIENT SURVEYS:  Patient-Specific Activity Scoring Scheme  0 represents "unable to perform." 10 represents "able to perform at prior level. 0 1 2 3 4 5 6 7 8 9  10 (Date and Score)   Activity Eval  03/23/24  04/14/24 05/19/24  1. running  4 4  3   2. Walking up flights of steps  5 8  5   3. Marching for [employer] purposes 5 3 5   4.     5.     Score 4.6 5 4.3   Total score =  sum of the activity scores/number of activities Minimum detectable change (90%CI) for average score = 2 points Minimum detectable change (90%CI) for single activity score = 3 points  COGNITION: 03/23/2024 Overall cognitive status: WFL    SENSATION: 03/23/2024 [facility]  MUSCLE LENGTH 04/28/2024: Positive Rt thomas for hip flexor tightness with pain  EDEMA:  03/23/2024 Circumferential: Rt knee: 40 centimeters, Left knee: 39.5 centimeters   LOWER EXTREMITY ROM:   ROM Right Eval 03/23/24 Supine active Left Eval 03/23/24 Supine  active Rt /  Left 05/19/24 Supine  active  Hip flexion     Hip extension     Hip abduction     Hip adduction     Hip internal rotation     Hip external rotation     Knee flexion 124 c pain 126  c pain 128 / 125 No pain  Knee extension 0 0 0 / 0   Ankle dorsiflexion     Ankle plantarflexion          Hamstring supine, opposite knee bent 55 52 70 / 60 Pain on the right in pt's low back, QL   (Blank rows = not tested)  LOWER EXTREMITY MMT:  MMT Right Eval 03/23/24  Left Eval 03/23/24 Right 05/05/2024 Left 05/05/2024 Right 05/14/2024 Left  05/14/2024 R/L 05/21/24  Hip flexion         Hip extension         Hip abduction         Hip adduction         Hip internal rotation         Hip external rotation         Knee flexion 59.5 lbs 65.2 lbs     72.0/ 60.7  Knee extension 70.7 lbs 65.9 lbs 5/5 81, 83 lbs 5/5 94, 91 5/5 95, 88 lbs 5/5 89, 90 lbs   Ankle dorsiflexion         Ankle plantarflexion         Ankle inversion         Ankle eversion          (Blank rows = not tested)    FUNCTIONAL TESTS:  05/21/24 Rt SLS:60 sec Lt SLS: 60 sec  05/19/2024: Rt SLS: 9 Seconds due to low back pain new onset since last week Lt SLS: 30 seconds and then stopped, no pain noted on left   04/21/2024: Rt SLS: 30 Seconds Lt SLS: 15 seconds  03/23/24:  5 time sit to stand: 19.30 seconds no UE support  GAIT: 05/12/2024: noted antalgic gait with reduced stance on Lt knee.   03/23/2024 Distance walked: clinic distances Assistive device utilized: None Level of assistance: Complete Independence Comments: antalgic, wide BOS, decreased terminal knee extension  TODAY'S TREATMENT                                                                           DATE:05/21/24 Therex: UBE LE only for ROM and muscle profusion lvl 3.5 8 mins, seat  13 Monster walk fwd/back green band around lower legs 15 ft x 4 each way  Leg press double leg 75 lb x 20 with slow lowering focus  Leg press single leg 43 lbs x 20 - bilaterally   Neuro Re-ed (balance control, coordination) Y balance reach with contralateral leg light touch to ground x 8 each way, performed bilaterally  SLS testing Manual  Percussion to pt's lumbar paraspinals and Rt QL   DATE: 05/19/2024 Therex: See updated ROM, SLS and PSFS above Supine hamstring stretch: x 2 bil LE Supine SKTC:  2 bil holding 60 sec Prone elbow press ups x 60 sec Standing trunk extension elbow on the wall x 5 holding 10 sec Leg press double leg 75 lb x 20 with slow lowering focus  Leg press single leg 43 lbs x 20 - bilaterally   Manual  Percussion to pt's lumbar paraspinals and Rt QL Lumbar grade 2-3 PA mobs      TODAY'S TREATMENT                                                                          DATE: 05/14/2024 Therex: UBE LE only for ROM and muscle profusion lvl 3.5 8 mins, seat 13 Lateral stepping green band with partial squat to tolerance 15 ft x 4 each way  Monster walk fwd/back green band around lower legs 15 ft x 4 each way  TRX double leg squat slow lowering focus 2 x 20  Machine knee extension double leg up, single leg lowering slowly 10 lbs x 15 bilateral   Neuro Re-ed (balance control, coordination) Y balance reach with contralateral leg light touch to ground x 8 each way, performed bilaterally  SLS on foam with reactive blazepod light touching 4 square 30 sec x 3 bialteral  SLS hip abduction pulsing green band around lower legs 2 x 20 bilateral    TODAY'S TREATMENT                                                                          DATE: 05/12/2024 Therex: Recumbent bike seat 8 lvl 3 10 mins for ROM, pain relief with movement.  Seated SLR with hip abduction/adduction movement over kettle bell x 15, performed bilaterally  Attempted wall squat hold but pain in  Lt knee noted. Leg press double leg 75 lb x 20 with slow lowering focus  Leg press single leg 43 lbs  x 20 - bilaterally    Neuro Re-ed (balance improvements, muscle activation, control/coordination improvements) Standing small pball quad set press reactive hold 5 sec x 15, performed bilaterally  Tandem stance 1 min x 2 bilateral on foam with occasional HHA on bar.  SLS on foam with contralateral leg corner touching x 6 each, performed bilaterally    TODAY'S TREATMENT                                                                          DATE: 05/06/2024 Therex: UBE LE only seat 11, aerobic with interval training :15 seconds up to 100-110 rpm each minute starting at 2 mins through 8 mins, 10 mins total.   Lvl 4.0.  Wall sit hold in approx. 50 deg knee flexion to fatigue 1 min, 45 seconds, 45 seconds  TherActivity (to improve squat movement, transfers, stair control) TRX double leg squat x 20 TRX single leg reverse lunge slider to tolerance 2 x 15 bilateral, performed with slow control focus (had pain complaint on last 2 reps with Lt knee with Lt leg in back   Neuro Re-ed (muscle activation, balance improvements, stability Tandem ambulation on foam fwd/back 6 ft x 5 each way  SLS with slight knee flexion on foam 1 min x 1 bilateral  Fitter rocker board fwd/back light touch control for ankle strategy x 25 each way, 1 min hover balance attempts with occasional to moderate HHA on bar.       PATIENT EDUCATION:  Education details: HEP, POC Person educated: Patient Education method: Programmer, multimedia, Demonstration, Verbal cues, and Handouts Education comprehension: verbalized understanding, returned demonstration, and verbal cues required  HOME EXERCISE PROGRAM: Access Code: [email or web address] URL: [email or web address] Date: 04/28/2024 Prepared by: Ozell Silvan  Exercises - Supine Bridge  - 2 x daily - 7 x weekly - 2 sets - 10 reps - Seated Hamstring Stretch  - 2 x daily - 7 x  weekly - 3 reps - 30 seconds hold - Sitting Knee Extension with Resistance  - 2 x daily - 7 x weekly - 2 sets - 10 reps - Seated Quad Set  - 3-5 x daily - 7 x weekly - 1 sets - 10 reps - 5 hold - Seated SLR  - 1-2 x daily - 7 x weekly - 1-2 sets - 10-15 reps - 2 hold - Modified Thomas Stretch  - 2-3 x daily - 7 x weekly - 1 sets - 3-5 reps - 15-30 hold - Prone Quadriceps Stretch with Strap  - 2-3 x daily - 7 x weekly - 1 sets - 3-5 reps - 15-30 hold  ASSESSMENT:  CLINICAL IMPRESSION:   Pt has been in PT for 11 sessions and shows some increase in strength and definite improvement in balance but has not reached all LTG and has been limited by pain.  He would benefit from continued skilled therapy for another 6 weeks to complete these goals.   OBJECTIVE IMPAIRMENTS: difficulty walking, decreased ROM, decreased strength, increased edema, impaired flexibility, and pain.   ACTIVITY LIMITATIONS: lifting, bending, standing, squatting, and stairs  PARTICIPATION LIMITATIONS: community activity and occupation  PERSONAL FACTORS: 1 comorbidity: see PMH are also affecting patient's functional outcome.   REHAB POTENTIAL: Excellent  CLINICAL DECISION MAKING: Stable/uncomplicated  EVALUATION COMPLEXITY: Low   GOALS: Goals reviewed with patient? Yes  SHORT TERM GOALS: (target date for Short term goals are 3 weeks 04/13/2024)   1.  Patient will demonstrate independent use of home exercise program to maintain progress from in clinic treatments.  Goal status: Met   LONG TERM GOALS: (target dates for all long term goals is- 07/02/24   1. Patient will demonstrate/report pain at worst less than or equal to 2/10 to facilitate minimal limitation in daily activity secondary to pain symptoms.  Goal status: on going 8/28//2025   2. Patient will demonstrate independent use of home exercise program to facilitate ability to maintain/progress functional gains from skilled physical therapy services.  Goal  status: on going 8/28//2025   3. Patient will demonstrate Patient specific functional scale avg > or = 6.6 to indicate reduced disability due to condition.   Goal status: on going 8/28//2025   4.  Patient will demonstrate bil LE MMT by >/= 10 lbs using HHD with knee flexion and extension throughout to faciltiate usual transfers, stairs, squatting at PLOF for daily life.   Goal status:Met for lbs number extension. Increase strength in knee flexion but goal ongpoing.   5.  Patient will demonstrate up and down 1 flight of stairs with single hand rail with no pain noted in bilateral knees.  Goal status: Met 04/14/24   6.  Pt will be able to perform full squat using correct body mechanics with pain of </= 2/10.  Goal status: on going 8/28//2025- Pain rated as 5/10 with full squat      PLAN:  PT FREQUENCY: 1-2x/week  PT DURATION: 6 weeks  07/02/24  PLANNED INTERVENTIONS: Can include 02853- PT Re-evaluation, 97110-Therapeutic exercises, 97530- Therapeutic activity, 97112- Neuromuscular re-education, 97535- Self Care, 97140- Manual therapy, [phone]- Gait training, [phone]- Orthotic Fit/training, [phone]- Canalith repositioning, [id number]- Aquatic Therapy, [phone]- Electrical stimulation (unattended), [id number] Physical performance testing, 97016- Vasopneumatic device, [id number]- Ultrasound, [id number]- Traction (mechanical), [id number]- Ionotophoresis 4mg /ml Dexamethasone,  20560 - Needle insertion w/o injection 1 or 2 muscles, 20561 - Needle insertion w/o injection 3 or more muscles.   Patient/Family education, Balance training, Stair training, Taping, Dry Needling, Joint mobilization, Joint manipulation, Spinal manipulation, Spinal mobilization, Scar mobilization, Vestibular training, Visual/preceptual remediation/compensation, DME instructions, Cryotherapy, and Moist heat.  All performed as medically necessary.  All included unless contraindicated  PLAN FOR NEXT SESSION:  Continued dynamic balance, hip/quad strengthening in  WB    Burnard Meth, PT 05/21/24  4:29 PM

## 2024-05-21 ENCOUNTER — Ambulatory Visit (INDEPENDENT_AMBULATORY_CARE_PROVIDER_SITE_OTHER)

## 2024-05-21 DIAGNOSIS — R262 Difficulty in walking, not elsewhere classified: Secondary | ICD-10-CM

## 2024-05-21 DIAGNOSIS — M25562 Pain in left knee: Secondary | ICD-10-CM

## 2024-05-21 DIAGNOSIS — M25561 Pain in right knee: Secondary | ICD-10-CM | POA: Diagnosis not present

## 2024-05-21 DIAGNOSIS — M6281 Muscle weakness (generalized): Secondary | ICD-10-CM | POA: Diagnosis not present

## 2024-05-21 DIAGNOSIS — G8929 Other chronic pain: Secondary | ICD-10-CM

## 2024-05-27 NOTE — Therapy (Signed)
 OUTPATIENT PHYSICAL THERAPY TREATMENT/RE CERT   Patient Name: Zachary Fischer MRN: [id number] DOB:Apr 19, 1981, 43 y.o., male Today's Date: 05/28/24  END OF SESSION:  PT End of Session - 05/28/24 1101     Visit Number 12    Number of Visits 16    Date for PT Re-Evaluation 07/02/24    Authorization Type [employer] and [employer]    PT Start Time 0812    PT Stop Time 0840    PT Time Calculation (min) 28 min    Activity Tolerance Patient tolerated treatment well    Behavior During Therapy [facility] for tasks assessed/performed                     Past Medical History:  Diagnosis Date   Allergy    Past Surgical History:  Procedure Laterality Date   ESOPHAGOGASTRODUODENOSCOPY (EGD) WITH PROPOFOL  N/A 05/13/2019   Procedure: ESOPHAGOGASTRODUODENOSCOPY (EGD) WITH PROPOFOL ;  Surgeon: Therisa Bi, MD;  Location: [facility] ENDOSCOPY;  Service: Gastroenterology;  Laterality: N/A;   NECK SURGERY     08/19 lipoma    Patient Active Problem List   Diagnosis Date Noted   Complete tear of ligament of thumb 12/10/2023   Left thumb sprain 11/26/2023   Pain in right knee 11/08/2023   Esophageal obstruction 05/13/2019   Routine general medical examination at a health care facility 05/29/2013    PCP: Duanne Butler DASEN, MD   REFERRING PROVIDER: Jerri Kay HERO, MD   REFERRING DIAG:  Diagnosis  M25.561,M25.562,G89.29 (ICD-10-CM) - Chronic pain of both knees    THERAPY DIAG:  Bilateral chronic knee pain  Muscle weakness (generalized)  Difficulty in walking, not elsewhere classified  Localized edema  Rationale for Evaluation and Treatment: Rehabilitation  ONSET DATE: Pt reporting pain has been ongoing for years and gradually wosened   SUBJECTIVE:   SUBJECTIVE STATEMENT: Pt reports Rt side low back pain is still present but less.  Knees feeling sore but okay. Pt10+ minutes late for appt. PERTINENT HISTORY: Neck surgery -tumor removal   PAIN:  NPRS scale: 4/10 upon arrival in  bilateral knees, 3/10 pain in his right side low back today Pain location: bilateral knees Pain description: achy Aggravating factors: walking up stairs, transfers after sitting prolonged.  Relieving factors: Tylenol , Advil , Icy-hot   PRECAUTIONS: None   WEIGHT BEARING RESTRICTIONS: No  FALLS:  Has patient fallen in last 6 months? 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 Possible scarring or localized nodular synovitis in the prefemoral fat without associated osseous erosion. 2. No other significant osseous findings are identified. The menisci, cruciate and collateral ligaments are intact. 3. No acute osseous findings.  PATIENT SURVEYS:  Patient-Specific Activity Scoring Scheme  0 represents "unable to perform." 10 represents "able to perform at prior level. 0 1 2 3 4 5 6 7 8 9  10 (Date and Score)   Activity Eval  03/23/24  04/14/24 05/19/24  1. running  4 4  3   2. Walking up flights of steps  5 8  5   3. Marching for [employer] purposes 5 3 5   4.  5.     Score 4.6 5 4.3   Total score = sum of the activity scores/number of activities Minimum detectable change (90%CI) for average score = 2 points Minimum detectable change (90%CI) for single activity score = 3 points  COGNITION: 03/23/2024 Overall cognitive status: WFL    SENSATION: 03/23/2024 [facility]  MUSCLE LENGTH 04/28/2024: Positive Rt thomas for hip flexor tightness with pain  EDEMA:  03/23/2024 Circumferential: Rt knee: 40 centimeters, Left knee: 39.5 centimeters   LOWER EXTREMITY ROM:   ROM  Right Eval 03/23/24 Supine active Left Eval 03/23/24 Supine  active Rt / Left 05/19/24 Supine  active  Hip flexion     Hip extension     Hip abduction     Hip adduction     Hip internal rotation     Hip external rotation     Knee flexion 124 c pain 126  c pain 128 / 125 No pain  Knee extension 0 0 0 / 0   Ankle dorsiflexion     Ankle plantarflexion          Hamstring supine, opposite knee bent 55 52 70 / 60 Pain on the right in pt's low back, QL   (Blank rows = not tested)  LOWER EXTREMITY MMT:  MMT Right Eval 03/23/24  Left Eval 03/23/24 Right 05/05/2024 Left 05/05/2024 Right 05/14/2024 Left  05/14/2024 R/L 05/21/24  Hip flexion         Hip extension         Hip abduction         Hip adduction         Hip internal rotation         Hip external rotation         Knee flexion 59.5 lbs 65.2 lbs     72.0/ 60.7  Knee extension 70.7 lbs 65.9 lbs 5/5 81, 83 lbs 5/5 94, 91 5/5 95, 88 lbs 5/5 89, 90 lbs   Ankle dorsiflexion         Ankle plantarflexion         Ankle inversion         Ankle eversion          (Blank rows = not tested)    FUNCTIONAL TESTS:  05/21/24 Rt SLS:60 sec Lt SLS: 60 sec  05/19/2024: Rt SLS: 9 Seconds due to low back pain new onset since last week Lt SLS: 30 seconds and then stopped, no pain noted on left   04/21/2024: Rt SLS: 30 Seconds Lt SLS: 15 seconds  03/23/24:  5 time sit to stand: 19.30 seconds no UE support  GAIT: 05/12/2024: noted antalgic gait with reduced stance on Lt knee.   03/23/2024 Distance walked: clinic distances Assistive device utilized: None Level of assistance: Complete Independence Comments: antalgic, wide BOS, decreased terminal knee extension  TODAY'S TREATMENT                                                                            05/28/24 Therex: Rec Bike 7.5 min Monster walk fwd/back green band around lower legs 15 ft x 4 each way  Leg press double leg inc to 87 lb 2x15 with slow lowering focus  Leg press single leg 43 lbs x 20 - bilaterally   Manual  Percussion to pt's lumbar paraspinals and Rt QL  DATE:05/21/24 Therex: UBE LE only for ROM and muscle profusion lvl 3.5 8 mins, seat 13 Monster walk fwd/back green band around lower legs 15 ft x 4 each way  Leg press double leg 75 lb x 20 with slow lowering focus  Leg press single leg 43 lbs x 20 - bilaterally  Manual  Percussion to pt's lumbar paraspinals and Rt QL Neuro Re-ed (balance control, coordination) Y balance reach with contralateral leg light touch to ground x 8 each way, performed bilaterally  SLS testing Manual  Percussion to pt's lumbar paraspinals and Rt QL   DATE: 05/19/2024 Therex: See updated ROM, SLS and PSFS above Supine hamstring stretch: x 2 bil LE Supine SKTC:  2 bil holding 60 sec Prone elbow press ups x 60 sec Standing trunk extension elbow on the wall x 5 holding 10 sec Leg press double leg 75 lb x 20 with slow lowering focus  Leg press single leg 43 lbs x 20 - bilaterally   Manual  Percussion to pt's lumbar paraspinals and Rt QL Lumbar grade 2-3 PA mobs      TODAY'S TREATMENT                                                                          DATE: 05/14/2024 Therex: UBE LE only for ROM and muscle profusion lvl 3.5 8 mins, seat 13 Lateral stepping green band with partial squat to tolerance 15 ft x 4 each way  Monster walk fwd/back green band around lower legs 15 ft x 4 each way  TRX double leg squat slow lowering focus 2 x 20  Machine knee extension double leg up, single leg lowering slowly 10 lbs x 15 bilateral   Neuro Re-ed (balance control, coordination) Y balance reach with contralateral leg light touch to ground x 8 each way, performed bilaterally  SLS on foam with reactive blazepod light touching 4 square  30 sec x 3 bialteral  SLS hip abduction pulsing green band around lower legs 2 x 20 bilateral    TODAY'S TREATMENT  DATE: 05/12/2024 Therex: Recumbent bike seat 8 lvl 3 10 mins for ROM, pain relief with movement.  Seated SLR with hip abduction/adduction movement over kettle bell x 15, performed bilaterally  Attempted wall squat hold but pain in Lt knee noted. Leg press double leg 75 lb x 20 with slow lowering focus  Leg press single leg 43 lbs x 20 - bilaterally    Neuro Re-ed (balance improvements, muscle activation, control/coordination improvements) Standing small pball quad set press reactive hold 5 sec x 15, performed bilaterally  Tandem stance 1 min x 2 bilateral on foam with occasional HHA on bar.  SLS on foam with contralateral leg corner touching x 6 each, performed bilaterally    TODAY'S TREATMENT                                                                          DATE: 05/06/2024 Therex: UBE LE only seat 11, aerobic with interval training :15 seconds up to 100-110 rpm each minute starting at 2 mins through 8 mins, 10 mins total.   Lvl 4.0.  Wall sit hold in approx. 50 deg knee flexion to fatigue 1 min, 45 seconds, 45 seconds  TherActivity (to improve squat movement, transfers, stair control) TRX double leg squat x 20 TRX single leg reverse lunge slider to tolerance 2 x 15 bilateral, performed with slow control focus (had pain complaint on last 2 reps with Lt knee with Lt leg in back   Neuro Re-ed (muscle activation, balance improvements, stability Tandem ambulation on foam fwd/back 6 ft x 5 each way  SLS with slight knee flexion on foam 1 min x 1 bilateral  Fitter rocker board fwd/back light touch control for ankle strategy x 25 each way, 1 min hover balance attempts with occasional to moderate HHA on bar.       PATIENT EDUCATION:  Education details: HEP, POC Person educated:  Patient Education method: Programmer, multimedia, Demonstration, Verbal cues, and Handouts Education comprehension: verbalized understanding, returned demonstration, and verbal cues required  HOME EXERCISE PROGRAM: Access Code: [email or web address] URL: [email or web address] Date: 04/28/2024 Prepared by: Ozell Silvan  Exercises - Supine Bridge  - 2 x daily - 7 x weekly - 2 sets - 10 reps - Seated Hamstring Stretch  - 2 x daily - 7 x weekly - 3 reps - 30 seconds hold - Sitting Knee Extension with Resistance  - 2 x daily - 7 x weekly - 2 sets - 10 reps - Seated Quad Set  - 3-5 x daily - 7 x weekly - 1 sets - 10 reps - 5 hold - Seated SLR  - 1-2 x daily - 7 x weekly - 1-2 sets - 10-15 reps - 2 hold - Modified Thomas Stretch  - 2-3 x daily - 7 x weekly - 1 sets - 3-5 reps - 15-30 hold - Prone Quadriceps Stretch with Strap  - 2-3 x daily - 7 x weekly - 1 sets - 3-5 reps - 15-30 hold  ASSESSMENT:  CLINICAL IMPRESSION:   Pt reported no knee pain with program.  Some increased low back tightness resolved by manual work. OBJECTIVE IMPAIRMENTS: difficulty walking, decreased ROM, decreased strength, increased edema, impaired flexibility, and pain.   ACTIVITY LIMITATIONS: lifting, bending,  standing, squatting, and stairs  PARTICIPATION LIMITATIONS: community activity and occupation  PERSONAL FACTORS: 1 comorbidity: see PMH are also affecting patient's functional outcome.   REHAB POTENTIAL: Excellent  CLINICAL DECISION MAKING: Stable/uncomplicated  EVALUATION COMPLEXITY: Low   GOALS: Goals reviewed with patient? Yes  SHORT TERM GOALS: (target date for Short term goals are 3 weeks 04/13/2024)   1.  Patient will demonstrate independent use of home exercise program to maintain progress from in clinic treatments.  Goal status: Met   LONG TERM GOALS: (target dates for all long term goals is- 07/02/24   1. Patient will demonstrate/report pain at worst less than or equal to 2/10 to facilitate  minimal limitation in daily activity secondary to pain symptoms.  Goal status: on going 8/28//2025   2. Patient will demonstrate independent use of home exercise program to facilitate ability to maintain/progress functional gains from skilled physical therapy services.  Goal status: on going 8/28//2025   3. Patient will demonstrate Patient specific functional scale avg > or = 6.6 to indicate reduced disability due to condition.   Goal status: on going 8/28//2025   4.  Patient will demonstrate bil LE MMT by >/= 10 lbs using HHD with knee flexion and extension throughout to faciltiate usual transfers, stairs, squatting at PLOF for daily life.   Goal status:Met for lbs number extension. Increase strength in knee flexion but goal ongpoing.   5.  Patient will demonstrate up and down 1 flight of stairs with single hand rail with no pain noted in bilateral knees.  Goal status: Met 04/14/24   6.  Pt will be able to perform full squat using correct body mechanics with pain of </= 2/10.  Goal status: on going 8/28//2025- Pain rated as 5/10 with full squat      PLAN:  PT FREQUENCY: 1-2x/week  PT DURATION: 6 weeks  07/02/24  PLANNED INTERVENTIONS: Can include 02853- PT Re-evaluation, 97110-Therapeutic exercises, 97530- Therapeutic activity, 97112- Neuromuscular re-education, 97535- Self Care, 97140- Manual therapy, [phone]- Gait training, [phone]- Orthotic Fit/training, [phone]- Canalith repositioning, [id number]- Aquatic Therapy, [phone]- Electrical stimulation (unattended), [id number] Physical performance testing, 97016- Vasopneumatic device, [id number]- Ultrasound, [id number]- Traction (mechanical), [id number]- Ionotophoresis 4mg /ml Dexamethasone,  20560 - Needle insertion w/o injection 1 or 2 muscles, 20561 - Needle insertion w/o injection 3 or more muscles.   Patient/Family education, Balance training, Stair training, Taping, Dry Needling, Joint mobilization, Joint manipulation, Spinal manipulation, Spinal mobilization, Scar  mobilization, Vestibular training, Visual/preceptual remediation/compensation, DME instructions, Cryotherapy, and Moist heat.  All performed as medically necessary.  All included unless contraindicated  PLAN FOR NEXT SESSION:  Continued dynamic balance, hip/quad strengthening in WB    [employer], PT 05/28/24  11:04 AM

## 2024-05-28 ENCOUNTER — Ambulatory Visit (INDEPENDENT_AMBULATORY_CARE_PROVIDER_SITE_OTHER)

## 2024-05-28 DIAGNOSIS — M25561 Pain in right knee: Secondary | ICD-10-CM

## 2024-05-28 DIAGNOSIS — M6281 Muscle weakness (generalized): Secondary | ICD-10-CM | POA: Diagnosis not present

## 2024-05-28 DIAGNOSIS — M25562 Pain in left knee: Secondary | ICD-10-CM

## 2024-05-28 DIAGNOSIS — R262 Difficulty in walking, not elsewhere classified: Secondary | ICD-10-CM

## 2024-05-28 DIAGNOSIS — G8929 Other chronic pain: Secondary | ICD-10-CM

## 2024-05-28 DIAGNOSIS — R600 Localized edema: Secondary | ICD-10-CM | POA: Diagnosis not present

## 2024-06-02 ENCOUNTER — Ambulatory Visit (INDEPENDENT_AMBULATORY_CARE_PROVIDER_SITE_OTHER): Admitting: Physical Therapy

## 2024-06-02 ENCOUNTER — Encounter: Payer: Self-pay | Admitting: Physical Therapy

## 2024-06-02 DIAGNOSIS — M6281 Muscle weakness (generalized): Secondary | ICD-10-CM

## 2024-06-02 DIAGNOSIS — G8929 Other chronic pain: Secondary | ICD-10-CM

## 2024-06-02 DIAGNOSIS — R600 Localized edema: Secondary | ICD-10-CM

## 2024-06-02 DIAGNOSIS — M25561 Pain in right knee: Secondary | ICD-10-CM

## 2024-06-02 DIAGNOSIS — M25562 Pain in left knee: Secondary | ICD-10-CM

## 2024-06-02 DIAGNOSIS — R262 Difficulty in walking, not elsewhere classified: Secondary | ICD-10-CM

## 2024-06-02 NOTE — Therapy (Signed)
 OUTPATIENT PHYSICAL THERAPY TREATMENT/RE CERT   Patient Name: MATYAS BAISLEY MRN: [id number] DOB:1981-08-13, 43 y.o., male Today's Date: 05/28/24  END OF SESSION:  PT End of Session - 06/02/24 0839     Visit Number 13    Number of Visits 16    Date for PT Re-Evaluation 07/02/24    Authorization Type [employer] and [employer]    Progress Note Due on Visit 21    PT Start Time 0812    PT Stop Time 0845    PT Time Calculation (min) 33 min    Activity Tolerance Patient tolerated treatment well    Behavior During Therapy [facility] for tasks assessed/performed                      Past Medical History:  Diagnosis Date   Allergy    Past Surgical History:  Procedure Laterality Date   ESOPHAGOGASTRODUODENOSCOPY (EGD) WITH PROPOFOL  N/A 05/13/2019   Procedure: ESOPHAGOGASTRODUODENOSCOPY (EGD) WITH PROPOFOL ;  Surgeon: Therisa Bi, MD;  Location: [facility] ENDOSCOPY;  Service: Gastroenterology;  Laterality: N/A;   NECK SURGERY     08/19 lipoma    Patient Active Problem List   Diagnosis Date Noted   Complete tear of ligament of thumb 12/10/2023   Left thumb sprain 11/26/2023   Pain in right knee 11/08/2023   Esophageal obstruction 05/13/2019   Routine general medical examination at a health care facility 05/29/2013    PCP: Duanne Butler DASEN, MD   REFERRING PROVIDER: Jerri Kay HERO, MD   REFERRING DIAG:  Diagnosis  M25.561,M25.562,G89.29 (ICD-10-CM) - Chronic pain of both knees    THERAPY DIAG:  Bilateral chronic knee pain  Muscle weakness (generalized)  Difficulty in walking, not elsewhere classified  Localized edema  Rationale for Evaluation and Treatment: Rehabilitation  ONSET DATE: Pt reporting pain has been ongoing for years and gradually wosened   SUBJECTIVE:   SUBJECTIVE STATEMENT: Pt arriving reporting 3-4/10 pain in bilateral knees. Pt reporting right  hip was 7-8/10. Pt stating this weekend was pretty low key, but he has drills this weekend.   PERTINENT  HISTORY: Neck surgery -tumor removal   PAIN:  NPRS scale: 3-4/10 pain in bil knees, 7-8/10 in Rt hip.   Pain description: achy Aggravating factors: walking up stairs, transfers after sitting prolonged.  Relieving factors: Tylenol , Advil , Icy-hot   PRECAUTIONS: None   WEIGHT BEARING RESTRICTIONS: No  FALLS:  Has patient fallen in last 6 months? 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 Possible scarring or localized nodular synovitis in the prefemoral fat without associated osseous erosion. 2. No other significant osseous findings are identified. The menisci, cruciate and collateral ligaments are intact. 3. No acute osseous findings.  PATIENT SURVEYS:  Patient-Specific Activity Scoring Scheme  0 represents "unable to perform." 10 represents "able to perform at prior level. 0 1 2 3 4 5 6 7 8 9  10 (Date and Score)   Activity Eval  03/23/24  04/14/24 05/19/24  1. running  4 4  3   2. Walking up flights of steps  5 8  5   3. Marching for  military purposes 5 3 5   4.     5.     Score 4.6 5 4.3   Total score = sum of the activity scores/number of activities Minimum detectable change (90%CI) for average score = 2 points Minimum detectable change (90%CI) for single activity score = 3 points  COGNITION: 03/23/2024 Overall cognitive status: WFL    SENSATION: 03/23/2024 [facility]  MUSCLE LENGTH 04/28/2024: Positive Rt thomas for hip flexor tightness with pain  EDEMA:  03/23/2024 Circumferential: Rt knee: 40 centimeters, Left knee: 39.5 centimeters   LOWER EXTREMITY  ROM:   ROM Right Eval 03/23/24 Supine active Left Eval 03/23/24 Supine  active Rt / Left 05/19/24 Supine  active  Hip flexion     Hip extension     Hip abduction     Hip adduction     Hip internal rotation     Hip external rotation     Knee flexion 124 c pain 126  c pain 128 / 125 No pain  Knee extension 0 0 0 / 0   Ankle dorsiflexion     Ankle plantarflexion          Hamstring supine, opposite knee bent 55 52 70 / 60 Pain on the right in pt's low back, QL   (Blank rows = not tested)  LOWER EXTREMITY MMT:  MMT Right Eval 03/23/24  Left Eval 03/23/24 Right 05/05/2024 Left 05/05/2024 Right 05/14/2024 Left  05/14/2024 R/L 05/21/24  Hip flexion         Hip extension         Hip abduction         Hip adduction         Hip internal rotation         Hip external rotation         Knee flexion 59.5 lbs 65.2 lbs     72.0/ 60.7  Knee extension 70.7 lbs 65.9 lbs 5/5 81, 83 lbs 5/5 94, 91 5/5 95, 88 lbs 5/5 89, 90 lbs   Ankle dorsiflexion         Ankle plantarflexion         Ankle inversion         Ankle eversion          (Blank rows = not tested)    FUNCTIONAL TESTS:  05/21/24 Rt SLS:60 sec Lt SLS: 60 sec  05/19/2024: Rt SLS: 9 Seconds due to low back pain new onset since last week Lt SLS: 30 seconds and then stopped, no pain noted on left   04/21/2024: Rt SLS: 30 Seconds Lt SLS: 15 seconds  03/23/24:  5 time sit to stand: 19.30 seconds no UE support  GAIT: 05/12/2024: noted antalgic gait with reduced stance on Lt knee.   03/23/2024 Distance walked: clinic distances Assistive device utilized: None Level of assistance: Complete Independence Comments: antalgic, wide BOS, decreased terminal knee extension  TODAY'S TREATMENT                                                           06/02/24:   TherEx Bike level 4 x 5 minutes, seat 7  Neuro Re-Ed Vectors c sliding disc (cones placed ant/lat, lat, and post/lat) x 10 bil LE (more difficult on left side with sharp pain noted in stance left LE) TherAct Leg press double: 93 lb 3 x 10 slow eccentrics Leg press single: 43 lbs x 15 - bilaterally  TRX lunges x 10 bil  6 inch step over and back c one LE x 20  and then repeated on other LE Single leg dead lift 10# 2 x 5 bil LE c intermittent UE support     TODAY'S TREATMENT                                                                           05/28/24 Therex: Rec Bike 7.5 min Monster walk fwd/back green band around lower legs 15 ft x 4 each way  Leg press double leg inc to 87 lb 2x15 with slow lowering focus  Leg press single leg 43 lbs x 20 - bilaterally   Manual  Percussion to pt's lumbar paraspinals and Rt QL  DATE:05/21/24 Therex: UBE LE only for ROM and muscle profusion lvl 3.5 8 mins, seat 13 Monster walk fwd/back green band around lower legs 15 ft x 4 each way  Leg press double leg 75 lb x 20 with slow lowering focus  Leg press single leg 43 lbs x 20 - bilaterally  Manual  Percussion to pt's lumbar paraspinals and Rt QL Neuro Re-ed (balance control, coordination) Y balance reach with contralateral leg light touch to ground x 8 each way, performed bilaterally  SLS testing Manual  Percussion to pt's lumbar paraspinals and Rt QL   DATE: 05/19/2024 Therex: See updated ROM, SLS and PSFS above Supine hamstring stretch: x 2 bil LE Supine SKTC:  2 bil holding 60 sec Prone elbow press ups x 60 sec Standing trunk extension elbow on the wall x 5 holding 10 sec Leg press double leg 75 lb x 20 with slow lowering focus  Leg press single leg 43 lbs x 20 - bilaterally   Manual  Percussion to pt's lumbar paraspinals and Rt QL Lumbar grade 2-3 PA mobs    PATIENT EDUCATION:  Education details: HEP, POC Person educated: Patient Education method: Programmer, multimedia,  Facilities manager, Verbal cues, and Handouts Education comprehension: verbalized understanding, returned demonstration, and verbal cues required  HOME EXERCISE PROGRAM: Access Code: [email or web address] URL: [email or web address] Date: 04/28/2024 Prepared by: Ozell Silvan  Exercises - Supine Bridge  - 2 x daily - 7 x weekly - 2 sets - 10 reps - Seated Hamstring Stretch  - 2 x daily - 7 x weekly - 3 reps - 30 seconds hold - Sitting Knee Extension with Resistance  - 2 x daily - 7 x weekly - 2 sets - 10 reps - Seated Quad Set  - 3-5 x  daily - 7 x weekly - 1 sets - 10 reps - 5 hold - Seated SLR  - 1-2 x daily - 7 x weekly - 1-2 sets - 10-15 reps - 2 hold - Modified Thomas Stretch  - 2-3 x daily - 7 x weekly - 1 sets - 3-5 reps - 15-30 hold - Prone Quadriceps Stretch with Strap  - 2-3 x daily - 7 x weekly - 1 sets - 3-5 reps - 15-30 hold  ASSESSMENT:  CLINICAL IMPRESSION:   Pt with mild increase in left knee pain when performing vectors c his stance leg on the left. All other exercises tolerated well with no reports of increased pain. Pt still requiring intermittent UE support for dynamic balance SLS activities. Continue to progress as pt tolerates toward LTG's.     OBJECTIVE IMPAIRMENTS: difficulty walking, decreased ROM, decreased strength, increased edema, impaired flexibility, and pain.   ACTIVITY LIMITATIONS: lifting, bending, standing, squatting, and stairs  PARTICIPATION LIMITATIONS: community activity and occupation  PERSONAL FACTORS: 1 comorbidity: see PMH are also affecting patient's functional outcome.   REHAB POTENTIAL: Excellent  CLINICAL DECISION MAKING: Stable/uncomplicated  EVALUATION COMPLEXITY: Low   GOALS: Goals reviewed with patient? Yes  SHORT TERM GOALS: (target date for Short term goals are 3 weeks 04/13/2024)   1.  Patient will demonstrate independent use of home exercise program to maintain progress from in clinic treatments.  Goal status: Met    LONG TERM GOALS: (target dates for all long term goals is- 07/02/24   1. Patient will demonstrate/report pain at worst less than or equal to 2/10 to facilitate minimal limitation in daily activity secondary to pain symptoms.  Goal status: on going 8/28//2025   2. Patient will demonstrate independent use of home exercise program to facilitate ability to maintain/progress functional gains from skilled physical therapy services.  Goal status: on going 8/28//2025   3. Patient will demonstrate Patient specific functional scale avg > or = 6.6 to indicate reduced disability due to condition.   Goal status: on going 8/28//2025   4.  Patient will demonstrate bil LE MMT by >/= 10 lbs using HHD with knee flexion and extension throughout to faciltiate usual transfers, stairs, squatting at PLOF for daily life.   Goal status:Met for lbs number extension. Increase strength in knee flexion but goal ongpoing.   5.  Patient will demonstrate up and down 1 flight of stairs with single hand rail with no pain noted in bilateral knees.  Goal status: Met 04/14/24   6.  Pt will be able to perform full squat using correct body mechanics with pain of </= 2/10.  Goal status: on going 8/28//2025- Pain rated as 5/10 with full squat      PLAN:  PT FREQUENCY: 1-2x/week  PT DURATION: 6 weeks  07/02/24  PLANNED INTERVENTIONS: Can include 02853- PT Re-evaluation, 97110-Therapeutic exercises, 97530- Therapeutic activity, 97112- Neuromuscular re-education, 97535- Self Care, 97140- Manual therapy, [phone]- Gait training, [phone]- Orthotic Fit/training, [id number]- Canalith repositioning, [id number]- Aquatic Therapy, [phone]- Electrical stimulation (unattended), [id number] Physical performance testing, 97016- Vasopneumatic device, [id number]- Ultrasound, [id number]- Traction (mechanical), [id number]- Ionotophoresis 4mg /ml Dexamethasone,  20560 - Needle insertion w/o injection 1 or 2 muscles, 20561 - Needle insertion w/o injection 3 or more muscles.    Patient/Family education, Balance training, Stair training, Taping, Dry Needling, Joint mobilization, Joint manipulation, Spinal manipulation, Spinal mobilization, Scar mobilization, Vestibular training, Visual/preceptual remediation/compensation, DME instructions, Cryotherapy, and Moist heat.  All performed as medically necessary.  All included unless contraindicated  PLAN  FOR NEXT SESSION:  hip/quad strengthening in WB, dynamic balance and agility as tolerated    Delon Lunger, PT, MPT 06/02/24  8:49 AM

## 2024-06-09 ENCOUNTER — Ambulatory Visit (INDEPENDENT_AMBULATORY_CARE_PROVIDER_SITE_OTHER): Admitting: Rehabilitative and Restorative Service Providers"

## 2024-06-09 ENCOUNTER — Encounter: Payer: Self-pay | Admitting: Rehabilitative and Restorative Service Providers"

## 2024-06-09 DIAGNOSIS — G8929 Other chronic pain: Secondary | ICD-10-CM

## 2024-06-09 DIAGNOSIS — M6281 Muscle weakness (generalized): Secondary | ICD-10-CM

## 2024-06-09 DIAGNOSIS — M25561 Pain in right knee: Secondary | ICD-10-CM | POA: Diagnosis not present

## 2024-06-09 DIAGNOSIS — M25562 Pain in left knee: Secondary | ICD-10-CM

## 2024-06-09 DIAGNOSIS — R600 Localized edema: Secondary | ICD-10-CM

## 2024-06-09 DIAGNOSIS — R262 Difficulty in walking, not elsewhere classified: Secondary | ICD-10-CM

## 2024-06-09 NOTE — Therapy (Addendum)
 OUTPATIENT PHYSICAL THERAPY TREATMENT   Patient Name: Zachary Fischer MRN: [id number] DOB:10-10-1980, 43 y.o., male Today's Date: 05/28/24  END OF SESSION:  PT End of Session - 06/09/24 0811     Visit Number 14    Number of Visits 16    Date for PT Re-Evaluation 07/02/24    Authorization Type [employer] and [employer]    PT Start Time 0810    PT Stop Time 0848    PT Time Calculation (min) 38 min    Activity Tolerance Patient tolerated treatment well    Behavior During Therapy [facility] for tasks assessed/performed               Past Medical History:  Diagnosis Date   Allergy    Past Surgical History:  Procedure Laterality Date   ESOPHAGOGASTRODUODENOSCOPY (EGD) WITH PROPOFOL  N/A 05/13/2019   Procedure: ESOPHAGOGASTRODUODENOSCOPY (EGD) WITH PROPOFOL ;  Surgeon: Therisa Bi, MD;  Location: [facility] ENDOSCOPY;  Service: Gastroenterology;  Laterality: N/A;   NECK SURGERY     08/19 lipoma    Patient Active Problem List   Diagnosis Date Noted   Complete tear of ligament of thumb 12/10/2023   Left thumb sprain 11/26/2023   Pain in right knee 11/08/2023   Esophageal obstruction 05/13/2019   Routine general medical examination at a health care facility 05/29/2013    PCP: Duanne Butler DASEN, MD   REFERRING PROVIDER: Jerri Kay HERO, MD   REFERRING DIAG:  Diagnosis  M25.561,M25.562,G89.29 (ICD-10-CM) - Chronic pain of both knees    THERAPY DIAG:  Bilateral chronic knee pain  Muscle weakness (generalized)  Difficulty in walking, not elsewhere classified  Localized edema  Rationale for Evaluation and Treatment: Rehabilitation  ONSET DATE: Pt reporting pain has been ongoing for years and gradually wosened   SUBJECTIVE:   SUBJECTIVE STATEMENT: Patient had drills this weekend and had high levels of bilateral knee pain and soreness. Patient continues to have Rt hip pain.   PERTINENT HISTORY: Neck surgery -tumor removal   PAIN:  NPRS scale: 3/10 bilateral knees   Pain  description: achy Aggravating factors: walking up stairs, transfers after sitting prolonged.  Relieving factors: Tylenol , Advil , Icy-hot   PRECAUTIONS: None   WEIGHT BEARING RESTRICTIONS: No  FALLS:  Has patient fallen in last 6 months? 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 Possible scarring or localized nodular synovitis in the prefemoral fat without associated osseous erosion. 2. No other significant osseous findings are identified. The menisci, cruciate and collateral ligaments are intact. 3. No acute osseous findings.  PATIENT SURVEYS:  Patient-Specific Activity Scoring Scheme  0 represents "unable to perform." 10 represents "able to perform at prior level. 0 1 2 3 4 5 6 7 8 9  10 (Date and Score)   Activity Eval  03/23/24  04/14/24 05/19/24  1. running  4 4  3   2. Walking up flights of steps  5 8  5   3. Marching for [employer] purposes 5 3 5   4.     5.     Score 4.6 5 4.3   Total score = sum of the activity scores/number  of activities Minimum detectable change (90%CI) for average score = 2 points Minimum detectable change (90%CI) for single activity score = 3 points  COGNITION: 03/23/2024 Overall cognitive status: WFL    SENSATION: 03/23/2024 [facility]  MUSCLE LENGTH 04/28/2024: Positive Rt thomas for hip flexor tightness with pain  EDEMA:  03/23/2024 Circumferential: Rt knee: 40 centimeters, Left knee: 39.5 centimeters   LOWER EXTREMITY ROM:   ROM Right Eval 03/23/24 Supine active Left Eval 03/23/24 Supine  active Rt / Left 05/19/24 Supine   active  Hip flexion     Hip extension     Hip abduction     Hip adduction     Hip internal rotation     Hip external rotation     Knee flexion 124 c pain 126  c pain 128 / 125 No pain  Knee extension 0 0 0 / 0   Ankle dorsiflexion     Ankle plantarflexion          Hamstring supine, opposite knee bent 55 52 70 / 60 Pain on the right in pt's low back, QL   (Blank rows = not tested)  LOWER EXTREMITY MMT:  MMT Right Eval 03/23/24  Left Eval 03/23/24 Right 05/05/2024 Left 05/05/2024 Right 05/14/2024 Left  05/14/2024 R/L 05/21/24 Right 06/09/2024 Left 06/09/2024  Hip flexion           Hip extension        3+/5 5/5  Hip abduction        3+/5 4/5  Hip adduction           Hip internal rotation           Hip external rotation           Knee flexion 59.5 lbs 65.2 lbs     72.0/ 60.7    Knee extension 70.7 lbs 65.9 lbs 5/5 81, 83 lbs 5/5 94, 91 5/5 95, 88 lbs 5/5 89, 90 lbs     Ankle dorsiflexion           Ankle plantarflexion           Ankle inversion           Ankle eversion            (Blank rows = not tested)    FUNCTIONAL TESTS:  05/21/24 Rt SLS:60 sec Lt SLS: 60 sec  05/19/2024: Rt SLS: 9 Seconds due to low back pain new onset since last week Lt SLS: 30 seconds and then stopped, no pain noted on left   04/21/2024: Rt SLS: 30 Seconds Lt SLS: 15 seconds  03/23/24:  5 time sit to stand: 19.30 seconds no UE support  GAIT: 05/12/2024: noted antalgic gait with reduced stance on Lt knee.   03/23/2024 Distance walked: clinic distances Assistive device utilized: None Level of assistance: Complete Independence Comments: antalgic, wide BOS, decreased terminal knee extension  TODAY'S TREATMENT                                                           DATE: 06/09/24 TherEx Recumbent bike level 3 for 8.5  minutes  Sidelying clamshells Rt hip with green band and without 2 x 10 Piriformis stretch bilaterally 15/20 seconds x 3 bilateral , done in chair and supine.  PT verbally educated and demo new exercises for updated HEP. Patient verbalized and demo understanding   TherAct (for facilitation of transfers) TRX squat, 3x10 TRX lunges, 2x10 bilateral  Manual Percussion device on gluteus medius Rt  MMT measurements taken  TREATMENT                                                           06/02/24:  TherEx Bike level 4 x 5 minutes, seat 7  Neuro Re-Ed Vectors c sliding disc (cones placed ant/lat, lat, and post/lat) x 10 bil LE (more difficult on left side with sharp pain noted in stance left LE) TherAct Leg press double: 93 lb 3 x 10 slow eccentrics Leg press single: 43 lbs x 15 - bilaterally  TRX lunges x 10 bil  6 inch step over and back c one LE x 20  and then repeated on other LE Single leg dead lift 10# 2 x 5 bil LE c intermittent UE support   TREATMENT                                                                           05/28/24 Therex: Rec Bike 7.5 min Monster walk fwd/back green band around lower legs 15 ft x 4 each way  Leg press double leg inc to 87 lb 2x15 with slow lowering focus  Leg press single leg 43 lbs x 20 - bilaterally   Manual  Percussion to pt's lumbar paraspinals and Rt QL   PATIENT EDUCATION:  Education details: HEP, POC Person educated: Patient Education method: Programmer, multimedia, Demonstration, Verbal cues, and Handouts Education comprehension: verbalized understanding, returned demonstration, and verbal cues required  HOME EXERCISE PROGRAM: Access Code: [email or web address] URL: [email or web address] Date: 06/09/2024 Prepared by: Ismael Theophilus Stallion  Exercises - Supine Bridge  - 2 x daily - 7 x weekly - 2 sets - 10 reps - Seated Hamstring Stretch  - 2 x daily - 7 x weekly - 3 reps - 30 seconds hold - Sitting Knee Extension with Resistance   - 2 x daily - 7 x weekly - 2 sets - 10 reps - Seated Quad Set  - 3-5 x daily - 7 x weekly - 1 sets - 10 reps - 5 hold - Seated SLR  - 1-2 x daily - 7 x weekly - 1-2 sets - 10-15 reps - 2 hold - Modified Thomas Stretch  - 2-3 x daily - 7 x weekly - 1  sets - 3-5 reps - 15-30 hold - Prone Quadriceps Stretch with Strap  - 2-3 x daily - 7 x weekly - 1 sets - 3-5 reps - 15-30 hold - Clamshell  - 1-2 x daily - 7 x weekly - 2-3 sets - 10 reps - Supine Piriformis Stretch with Foot on Ground  - 1-2 x daily - 7 x weekly - 2-3 sets - 30 seconds hold  ASSESSMENT:  CLINICAL IMPRESSION: Patient has weakness in in bilateral abductors and Rt glutes which could be contributing to his ongoing hip pain. Exercises were given to begin strengthening. Patient has high levels of pain with hip movement and exercises need to be modified accordingly. Lunges and squats targeting quads were tolerated well. Patient will continue to benefit from skilled PT to address impairments and minimize pain  OBJECTIVE IMPAIRMENTS: difficulty walking, decreased ROM, decreased strength, increased edema, impaired flexibility, and pain.   ACTIVITY LIMITATIONS: lifting, bending, standing, squatting, and stairs  PARTICIPATION LIMITATIONS: community activity and occupation  PERSONAL FACTORS: 1 comorbidity: see PMH are also affecting patient's functional outcome.   REHAB POTENTIAL: Excellent  CLINICAL DECISION MAKING: Stable/uncomplicated  EVALUATION COMPLEXITY: Low   GOALS: Goals reviewed with patient? Yes  SHORT TERM GOALS: (target date for Short term goals are 3 weeks 04/13/2024)   1.  Patient will demonstrate independent use of home exercise program to maintain progress from in clinic treatments.  Goal status: Met   LONG TERM GOALS: (target dates for all long term goals is- 07/02/24   1. Patient will demonstrate/report pain at worst less than or equal to 2/10 to facilitate minimal limitation in daily activity secondary to  pain symptoms.  Goal status: on going 8/28//2025   2. Patient will demonstrate independent use of home exercise program to facilitate ability to maintain/progress functional gains from skilled physical therapy services.  Goal status: on going 8/28//2025   3. Patient will demonstrate Patient specific functional scale avg > or = 6.6 to indicate reduced disability due to condition.   Goal status: on going 8/28//2025   4.  Patient will demonstrate bil LE MMT by >/= 10 lbs using HHD with knee flexion and extension throughout to faciltiate usual transfers, stairs, squatting at PLOF for daily life.   Goal status:Met for lbs number extension. Increase strength in knee flexion but goal ongpoing.   5.  Patient will demonstrate up and down 1 flight of stairs with single hand rail with no pain noted in bilateral knees.  Goal status: Met 04/14/24   6.  Pt will be able to perform full squat using correct body mechanics with pain of </= 2/10.  Goal status: on going 8/28//2025- Pain rated as 5/10 with full squat      PLAN:  PT FREQUENCY: 1-2x/week  PT DURATION: 6 weeks  07/02/24  PLANNED INTERVENTIONS: Can include 02853- PT Re-evaluation, 97110-Therapeutic exercises, 97530- Therapeutic activity, 97112- Neuromuscular re-education, 97535- Self Care, 97140- Manual therapy, [phone]- Gait training, [phone]- Orthotic Fit/training, [phone]- Canalith repositioning, [id number]- Aquatic Therapy, [phone]- Electrical stimulation (unattended), [id number] Physical performance testing, 97016- Vasopneumatic device, [id number]- Ultrasound, [id number]- Traction (mechanical), [id number]- Ionotophoresis 4mg /ml Dexamethasone,  20560 - Needle insertion w/o injection 1 or 2 muscles, 20561 - Needle insertion w/o injection 3 or more muscles.   Patient/Family education, Balance training, Stair training, Taping, Dry Needling, Joint mobilization, Joint manipulation, Spinal manipulation, Spinal mobilization, Scar mobilization, Vestibular training,  Visual/preceptual remediation/compensation, DME instructions, Cryotherapy, and Moist heat.  All performed as medically necessary.  All included unless contraindicated  PLAN FOR NEXT SESSION: check in w/ new hip exercises, hip/overall LE stretching, continuing functional LE strengthening   Ismael Theophilus Stallion, SPT 06/09/24  1:16 PM

## 2024-06-11 NOTE — Therapy (Signed)
 OUTPATIENT PHYSICAL THERAPY TREATMENT   Patient Name: Zachary Fischer MRN: [id number] DOB:December 09, 1980, 43 y.o., male Today's Date: 05/28/24  END OF SESSION:***         Past Medical History:  Diagnosis Date   Allergy    Past Surgical History:  Procedure Laterality Date   ESOPHAGOGASTRODUODENOSCOPY (EGD) WITH PROPOFOL  N/A 05/13/2019   Procedure: ESOPHAGOGASTRODUODENOSCOPY (EGD) WITH PROPOFOL ;  Surgeon: Therisa Bi, MD;  Location: [facility] ENDOSCOPY;  Service: Gastroenterology;  Laterality: N/A;   NECK SURGERY     08/19 lipoma    Patient Active Problem List   Diagnosis Date Noted   Complete tear of ligament of thumb 12/10/2023   Left thumb sprain 11/26/2023   Pain in right knee 11/08/2023   Esophageal obstruction 05/13/2019   Routine general medical examination at a health care facility 05/29/2013    PCP: Duanne Butler DASEN, MD   REFERRING PROVIDER: Jerri Kay HERO, MD   REFERRING DIAG:  Diagnosis  M25.561,M25.562,G89.29 (ICD-10-CM) - Chronic pain of both knees    THERAPY DIAG:  No diagnosis found.  Rationale for Evaluation and Treatment: Rehabilitation  ONSET DATE: Pt reporting pain has been ongoing for years and gradually wosened   SUBJECTIVE:   SUBJECTIVE STATEMENT: ***Patient had drills this weekend and had high levels of bilateral knee pain and soreness. Patient continues to have Rt hip pain.   PERTINENT HISTORY: Neck surgery -tumor removal   PAIN:  ***NPRS scale: 3/10 bilateral knees   Pain description: achy Aggravating factors: walking up stairs, transfers after sitting prolonged.  Relieving factors: Tylenol , Advil , Icy-hot   PRECAUTIONS: None   WEIGHT BEARING RESTRICTIONS: No  FALLS:  Has patient fallen in last 6 months? 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 Possible scarring or localized nodular synovitis in the prefemoral fat without associated osseous erosion. 2. No other significant osseous findings are identified. The menisci, cruciate and collateral ligaments are intact. 3. No acute osseous findings.  PATIENT SURVEYS:  Patient-Specific Activity Scoring Scheme  0 represents "unable to perform." 10 represents "able to perform at prior level. 0 1 2 3 4 5 6 7 8 9  10 (Date and Score)   Activity Eval  03/23/24  04/14/24 05/19/24  1. running  4 4  3   2. Walking up flights of steps  5 8  5   3. Marching for [employer] purposes 5 3 5   4.     5.     Score 4.6 5 4.3   Total score = sum of the activity scores/number of activities Minimum detectable change (90%CI) for average score = 2 points Minimum detectable change (90%CI) for single activity score = 3 points  COGNITION: 03/23/2024 Overall cognitive status: WFL    SENSATION: 03/23/2024 [facility]  MUSCLE LENGTH 04/28/2024: Positive Rt thomas for hip flexor tightness with pain  EDEMA:  03/23/2024 Circumferential: Rt knee: 40 centimeters, Left knee: 39.5 centimeters   LOWER EXTREMITY ROM:   ROM Right Eval 03/23/24 Supine active Left Eval 03/23/24 Supine  active Rt / Left 05/19/24 Supine  active  Hip flexion     Hip extension  Hip abduction     Hip adduction     Hip internal rotation     Hip external rotation     Knee flexion 124 c pain 126  c pain 128 / 125 No pain  Knee extension 0 0 0 / 0   Ankle dorsiflexion     Ankle plantarflexion          Hamstring supine, opposite knee bent 55 52 70 / 60 Pain on the right in pt's low back, QL   (Blank rows = not tested)  LOWER EXTREMITY MMT:  MMT Right Eval 03/23/24  Left Eval 03/23/24  Right 05/05/2024 Left 05/05/2024 Right 05/14/2024 Left  05/14/2024 R/L 05/21/24 Right 06/09/2024 Left 06/09/2024  Hip flexion           Hip extension        3+/5 5/5  Hip abduction        3+/5 4/5  Hip adduction           Hip internal rotation           Hip external rotation           Knee flexion 59.5 lbs 65.2 lbs     72.0/ 60.7    Knee extension 70.7 lbs 65.9 lbs 5/5 81, 83 lbs 5/5 94, 91 5/5 95, 88 lbs 5/5 89, 90 lbs     Ankle dorsiflexion           Ankle plantarflexion           Ankle inversion           Ankle eversion            (Blank rows = not tested)    FUNCTIONAL TESTS:  05/21/24 Rt SLS:60 sec Lt SLS: 60 sec  05/19/2024: Rt SLS: 9 Seconds due to low back pain new onset since last week Lt SLS: 30 seconds and then stopped, no pain noted on left   04/21/2024: Rt SLS: 30 Seconds Lt SLS: 15 seconds  03/23/24:  5 time sit to stand: 19.30 seconds no UE support  GAIT: 05/12/2024: noted antalgic gait with reduced stance on Lt knee.   03/23/2024 Distance walked: clinic distances Assistive device utilized: None Level of assistance: Complete Independence Comments: antalgic, wide BOS, decreased terminal knee extension                                                                                                                                                                        TODAY'S TREATMENT  06/12/24***                 DATE: 06/09/24 TherEx Recumbent bike level 3 for 8.5 minutes  Sidelying clamshells Rt hip with green band and without 2 x 10 Piriformis stretch bilaterally 15/20 seconds x 3 bilateral , done in chair and supine.  PT verbally educated and demo new exercises for updated HEP. Patient verbalized and demo understanding   TherAct (for facilitation of transfers) TRX squat, 3x10 TRX lunges, 2x10 bilateral  Manual Percussion device on gluteus medius Rt  MMT measurements taken  TREATMENT                                                            06/02/24:  TherEx Bike level 4 x 5 minutes, seat 7  Neuro Re-Ed Vectors c sliding disc (cones placed ant/lat, lat, and post/lat) x 10 bil LE (more difficult on left side with sharp pain noted in stance left LE) TherAct Leg press double: 93 lb 3 x 10 slow eccentrics Leg press single: 43 lbs x 15 - bilaterally  TRX lunges x 10 bil  6 inch step over and back c one LE x 20  and then repeated on other LE Single leg dead lift 10# 2 x 5 bil LE c intermittent UE support   TREATMENT                                                                           05/28/24 Therex: Rec Bike 7.5 min Monster walk fwd/back green band around lower legs 15 ft x 4 each way  Leg press double leg inc to 87 lb 2x15 with slow lowering focus  Leg press single leg 43 lbs x 20 - bilaterally   Manual  Percussion to pt's lumbar paraspinals and Rt QL   PATIENT EDUCATION:  Education details: HEP, POC Person educated: Patient Education method: Programmer, multimedia, Demonstration, Verbal cues, and Handouts Education comprehension: verbalized understanding, returned demonstration, and verbal cues required  HOME EXERCISE PROGRAM: Access Code: [email or web address] URL: [email or web address] Date: 06/09/2024 Prepared by: Ismael Theophilus Stallion  Exercises - Supine Bridge  - 2 x daily - 7 x weekly - 2 sets - 10 reps - Seated Hamstring Stretch  - 2 x daily - 7 x weekly - 3 reps - 30 seconds hold - Sitting Knee Extension with Resistance  - 2 x daily - 7 x weekly - 2 sets - 10 reps - Seated Quad Set  - 3-5 x daily - 7 x weekly - 1 sets - 10 reps - 5 hold - Seated SLR  - 1-2 x daily - 7 x weekly - 1-2 sets - 10-15 reps - 2 hold - Modified Thomas Stretch  - 2-3 x daily - 7 x weekly - 1 sets - 3-5 reps - 15-30 hold - Prone Quadriceps Stretch with Strap  - 2-3 x daily - 7 x weekly - 1 sets - 3-5 reps - 15-30 hold - Clamshell  - 1-2 x daily - 7 x weekly - 2-3  sets - 10 reps - Supine  Piriformis Stretch with Foot on Ground  - 1-2 x daily - 7 x weekly - 2-3 sets - 30 seconds hold  ASSESSMENT:  CLINICAL IMPRESSION: ***Patient has weakness in in bilateral abductors and Rt glutes which could be contributing to his ongoing hip pain. Exercises were given to begin strengthening. Patient has high levels of pain with hip movement and exercises need to be modified accordingly. Lunges and squats targeting quads were tolerated well. Patient will continue to benefit from skilled PT to address impairments and minimize pain  OBJECTIVE IMPAIRMENTS: difficulty walking, decreased ROM, decreased strength, increased edema, impaired flexibility, and pain.   ACTIVITY LIMITATIONS: lifting, bending, standing, squatting, and stairs  PARTICIPATION LIMITATIONS: community activity and occupation  PERSONAL FACTORS: 1 comorbidity: see PMH are also affecting patient's functional outcome.   REHAB POTENTIAL: Excellent  CLINICAL DECISION MAKING: Stable/uncomplicated  EVALUATION COMPLEXITY: Low   GOALS: Goals reviewed with patient? Yes  SHORT TERM GOALS: (target date for Short term goals are 3 weeks 04/13/2024)   1.  Patient will demonstrate independent use of home exercise program to maintain progress from in clinic treatments.  Goal status: Met   LONG TERM GOALS: (target dates for all long term goals is- 07/02/24   1. Patient will demonstrate/report pain at worst less than or equal to 2/10 to facilitate minimal limitation in daily activity secondary to pain symptoms.  Goal status: on going 8/28//2025   2. Patient will demonstrate independent use of home exercise program to facilitate ability to maintain/progress functional gains from skilled physical therapy services.  Goal status: on going 8/28//2025   3. Patient will demonstrate Patient specific functional scale avg > or = 6.6 to indicate reduced disability due to condition.   Goal status: on going 8/28//2025   4.  Patient will  demonstrate bil LE MMT by >/= 10 lbs using HHD with knee flexion and extension throughout to faciltiate usual transfers, stairs, squatting at PLOF for daily life.   Goal status:Met for lbs number extension. Increase strength in knee flexion but goal ongpoing.   5.  Patient will demonstrate up and down 1 flight of stairs with single hand rail with no pain noted in bilateral knees.  Goal status: Met 04/14/24   6.  Pt will be able to perform full squat using correct body mechanics with pain of </= 2/10.  Goal status: on going 8/28//2025- Pain rated as 5/10 with full squat      PLAN:  PT FREQUENCY: 1-2x/week  PT DURATION: 6 weeks  07/02/24  PLANNED INTERVENTIONS: Can include 02853- PT Re-evaluation, 97110-Therapeutic exercises, 97530- Therapeutic activity, 97112- Neuromuscular re-education, 97535- Self Care, 97140- Manual therapy, [phone]- Gait training, [phone]- Orthotic Fit/training, [phone]- Canalith repositioning, [id number]- Aquatic Therapy, [phone]- Electrical stimulation (unattended), [id number] Physical performance testing, 97016- Vasopneumatic device, [id number]- Ultrasound, [id number]- Traction (mechanical), [id number]- Ionotophoresis 4mg /ml Dexamethasone,  20560 - Needle insertion w/o injection 1 or 2 muscles, 20561 - Needle insertion w/o injection 3 or more muscles.   Patient/Family education, Balance training, Stair training, Taping, Dry Needling, Joint mobilization, Joint manipulation, Spinal manipulation, Spinal mobilization, Scar mobilization, Vestibular training, Visual/preceptual remediation/compensation, DME instructions, Cryotherapy, and Moist heat.  All performed as medically necessary.  All included unless contraindicated  PLAN FOR NEXT SESSION: ***check in w/ new hip exercises, hip/overall LE stretching, continuing functional LE strengthening    Burnard Meth, PT 06/11/24  9:58 AM

## 2024-06-12 ENCOUNTER — Ambulatory Visit (INDEPENDENT_AMBULATORY_CARE_PROVIDER_SITE_OTHER)

## 2024-06-12 DIAGNOSIS — M6281 Muscle weakness (generalized): Secondary | ICD-10-CM | POA: Diagnosis not present

## 2024-06-12 DIAGNOSIS — M25562 Pain in left knee: Secondary | ICD-10-CM

## 2024-06-12 DIAGNOSIS — M25561 Pain in right knee: Secondary | ICD-10-CM | POA: Diagnosis not present

## 2024-06-12 DIAGNOSIS — R600 Localized edema: Secondary | ICD-10-CM | POA: Diagnosis not present

## 2024-06-12 DIAGNOSIS — G8929 Other chronic pain: Secondary | ICD-10-CM

## 2024-06-12 DIAGNOSIS — R262 Difficulty in walking, not elsewhere classified: Secondary | ICD-10-CM | POA: Diagnosis not present

## 2024-06-15 ENCOUNTER — Ambulatory Visit (INDEPENDENT_AMBULATORY_CARE_PROVIDER_SITE_OTHER): Admitting: Physical Therapy

## 2024-06-15 ENCOUNTER — Encounter: Payer: Self-pay | Admitting: Physical Therapy

## 2024-06-15 DIAGNOSIS — R600 Localized edema: Secondary | ICD-10-CM | POA: Diagnosis not present

## 2024-06-15 DIAGNOSIS — R262 Difficulty in walking, not elsewhere classified: Secondary | ICD-10-CM

## 2024-06-15 DIAGNOSIS — M6281 Muscle weakness (generalized): Secondary | ICD-10-CM | POA: Diagnosis not present

## 2024-06-15 DIAGNOSIS — M25561 Pain in right knee: Secondary | ICD-10-CM

## 2024-06-15 DIAGNOSIS — G8929 Other chronic pain: Secondary | ICD-10-CM

## 2024-06-15 DIAGNOSIS — M25562 Pain in left knee: Secondary | ICD-10-CM

## 2024-06-15 NOTE — Therapy (Signed)
 OUTPATIENT PHYSICAL THERAPY TREATMENT Re-certification   Patient Name: Zachary Fischer MRN: [id number] DOB:12-12-1980, 43 y.o., male Today's Date: 05/28/24  END OF SESSION:  PT End of Session - 06/15/24 0843     Visit Number 16    Number of Visits 22    Date for Recertification  07/31/24    Authorization Type [employer] and [employer]    Progress Note Due on Visit 22    PT Start Time 0810    PT Stop Time 0848    PT Time Calculation (min) 38 min    Activity Tolerance Patient tolerated treatment well    Behavior During Therapy [facility] for tasks assessed/performed                 Past Medical History:  Diagnosis Date   Allergy    Past Surgical History:  Procedure Laterality Date   ESOPHAGOGASTRODUODENOSCOPY (EGD) WITH PROPOFOL  N/A 05/13/2019   Procedure: ESOPHAGOGASTRODUODENOSCOPY (EGD) WITH PROPOFOL ;  Surgeon: Therisa Bi, MD;  Location: [facility] ENDOSCOPY;  Service: Gastroenterology;  Laterality: N/A;   NECK SURGERY     08/19 lipoma    Patient Active Problem List   Diagnosis Date Noted   Complete tear of ligament of thumb 12/10/2023   Left thumb sprain 11/26/2023   Pain in right knee 11/08/2023   Esophageal obstruction 05/13/2019   Routine general medical examination at a health care facility 05/29/2013    PCP: Duanne Butler DASEN, MD   REFERRING PROVIDER: Jerri Kay HERO, MD   REFERRING DIAG:  Diagnosis  M25.561,M25.562,G89.29 (ICD-10-CM) - Chronic pain of both knees    THERAPY DIAG:  Bilateral chronic knee pain  Muscle weakness (generalized)  Difficulty in walking, not elsewhere classified  Localized edema  Rationale for Evaluation and Treatment: Rehabilitation  ONSET DATE: Pt reporting pain has been ongoing for years and gradually wosened   SUBJECTIVE:   SUBJECTIVE STATEMENT:  Pt reporting overall he had a pretty good weekend. Pt stating left knee is 6/10, Rt hip 6.5-7/10 at times.   PERTINENT HISTORY: Neck surgery -tumor removal   PAIN:  NPRS scale:  see above subjective  Pain description: achy Aggravating factors: walking up stairs, transfers after sitting prolonged.  Relieving factors: Tylenol , Advil , Icy-hot   PRECAUTIONS: None   WEIGHT BEARING RESTRICTIONS: No  FALLS:  Has patient fallen in last 6 months? 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 Possible scarring or localized nodular synovitis in the prefemoral fat without associated osseous erosion. 2. No other significant osseous findings are identified. The menisci, cruciate and collateral ligaments are intact. 3. No acute osseous findings.  PATIENT SURVEYS:  Patient-Specific Activity Scoring Scheme  0 represents "unable to perform." 10 represents "able to perform at prior level. 0 1 2 3 4 5 6 7 8 9  10 (Date and Score)   Activity Eval  03/23/24  04/14/24 05/19/24 06/15/24  1. running  4 4  3 3   2. Walking up flights of steps  5 8  5 5   3. Marching for [employer] purposes 5 3 5 5   4.      5.  Score 4.6 5 4.3 4.3   Total score = sum of the activity scores/number of activities Minimum detectable change (90%CI) for average score = 2 points Minimum detectable change (90%CI) for single activity score = 3 points  COGNITION: 03/23/2024 Overall cognitive status: WFL    SENSATION: 03/23/2024 [facility]  MUSCLE LENGTH 04/28/2024: Positive Rt thomas for hip flexor tightness with pain  EDEMA:  03/23/2024 Circumferential: Rt knee: 40 centimeters, Left knee: 39.5 centimeters   LOWER EXTREMITY ROM:   ROM Right Eval 03/23/24 Supine active  Left Eval 03/23/24 Supine  active Rt / Left 05/19/24 Supine  active  Hip flexion     Hip extension     Hip abduction     Hip adduction     Hip internal rotation     Hip external rotation     Knee flexion 124 c pain 126  c pain 128 / 125 No pain  Knee extension 0 0 0 / 0   Ankle dorsiflexion     Ankle plantarflexion          Hamstring supine, opposite knee bent 55 52 70 / 60 Pain on the right in pt's low back, QL   (Blank rows = not tested)  LOWER EXTREMITY MMT:  MMT Right Eval 03/23/24  Left Eval 03/23/24 Right 05/05/2024 Left 05/05/2024 Right 05/14/2024 Left  05/14/2024 R/L 05/21/24 Right 06/09/2024 Left 06/09/2024  Hip flexion           Hip extension        3+/5 5/5  Hip abduction        3+/5 4/5  Hip adduction           Hip internal rotation           Hip external rotation           Knee flexion 59.5 lbs 65.2 lbs     72.0/ 60.7    Knee extension 70.7 lbs 65.9 lbs 5/5 81, 83 lbs 5/5 94, 91 5/5 95, 88 lbs 5/5 89, 90 lbs     Ankle dorsiflexion           Ankle plantarflexion           Ankle inversion           Ankle eversion            (Blank rows = not tested)    FUNCTIONAL TESTS:  05/21/24 Rt SLS:60 sec Lt SLS: 60 sec  05/19/2024: Rt SLS: 9 Seconds due to low back pain new onset since last week Lt SLS: 30 seconds and then stopped, no pain noted on left   04/21/2024: Rt SLS: 30 Seconds Lt SLS: 15 seconds  03/23/24:  5 time sit to stand: 19.30 seconds no UE support  GAIT: 05/12/2024: noted antalgic gait with reduced stance on Lt knee.   03/23/2024 Distance walked: clinic distances Assistive device utilized: None Level of assistance: Complete Independence Comments: antalgic, wide BOS, decreased terminal knee extension  TODAY'S TREATMENT                                                 06/15/24 TherEx Scifit  bike level 4 for 3 minutes  Side lunges with green TB 2 x 10  TherAct (for facilitation of transfers) Double Leg press: 125# 2 x 15 Single Leg Press: bil 75 2 x 10  TRX squat on Airex mat: 2 x 10  TRX lunges, 2 x 10  bil LE TRX curtseys  2 x 10 each LE  NMR:  SLS: green TB pulled in all directions and then more laterally for adduction strengthening and stability    TODAY'S TREATMENT                                                06/12/24 TherEx Recumbent bike level 3 for 9 minutes  Sidelying clamshells R hip with green band 2 x 10 #4 1x10 Sidelying clamshells L 4# and green T band 1 set each Piriformis stretch bilaterally 25 seconds x 3 bilateral TherAct (for facilitation of transfers) TherAct (for facilitation of transfers) TRX squat, 3x10 2 on air ex TRX lunges, 2x10 bilateral TRX curtseys 10x each LE   Manual Percussion device on gluteus medius Rt                DATE: 06/09/24 TherEx Recumbent bike level 3 for 8.5 minutes  Sidelying clamshells Rt hip with green band and without 2 x 10 Piriformis stretch bilaterally 15/20 seconds x 3 bilateral , done in chair and supine.  PT verbally educated and demo new exercises for updated HEP. Patient verbalized and demo understanding   TherAct (for facilitation of transfers) TRX squat, 3x10 TRX lunges, 2x10 bilateral  Manual Percussion device on gluteus medius Rt  MMT measurements taken    PATIENT EDUCATION:  Education details: HEP, POC Person educated: Patient Education method: Programmer, multimedia, Demonstration, Verbal cues, and Handouts Education comprehension: verbalized understanding, returned demonstration, and verbal cues required  HOME EXERCISE PROGRAM: Access Code: [email or web address] URL: [email or web address] Date: 06/09/2024 Prepared by: Ismael Theophilus Stallion  Exercises - Supine Bridge  - 2 x daily - 7 x weekly - 2 sets - 10 reps - Seated Hamstring Stretch  - 2 x daily - 7 x  weekly - 3 reps - 30 seconds hold - Sitting Knee Extension with Resistance  - 2 x daily - 7 x weekly - 2 sets - 10 reps - Seated Quad Set  - 3-5 x daily - 7 x weekly - 1 sets - 10 reps - 5 hold - Seated SLR  - 1-2 x daily - 7 x weekly - 1-2 sets - 10-15 reps - 2 hold - Modified Thomas Stretch  - 2-3 x daily - 7 x weekly - 1 sets - 3-5 reps - 15-30 hold - Prone Quadriceps Stretch with Strap  - 2-3 x daily - 7 x weekly - 1 sets - 3-5 reps - 15-30 hold - Clamshell  - 1-2 x daily - 7 x weekly - 2-3 sets - 10 reps - Supine Piriformis Stretch with Foot on Ground  - 1-2 x daily - 7 x weekly - 2-3 sets - 30 seconds hold  ASSESSMENT:  CLINICAL  IMPRESSION:  Pt still reporting pain in Rt hip and left knee at times with deep squatting, military drills and functional mobility and  home chores. Pt has made progress with PT with strengthening and ROM since beginning therapy and his treatment is focusing on functional mobility and maneuvers. I am requesting 6 additional PT visits for 1x/ week over the next 6 weeks to progress toward goals not met.    OBJECTIVE IMPAIRMENTS: difficulty walking, decreased ROM, decreased strength, increased edema, impaired flexibility, and pain.   ACTIVITY LIMITATIONS: lifting, bending, standing, squatting, and stairs  PARTICIPATION LIMITATIONS: community activity and occupation  PERSONAL FACTORS: 1 comorbidity: see PMH are also affecting patient's functional outcome.   REHAB POTENTIAL: Excellent  CLINICAL DECISION MAKING: Stable/uncomplicated  EVALUATION COMPLEXITY: Low   GOALS: Goals reviewed with patient? Yes  SHORT TERM GOALS: (target date for Short term goals are 3 weeks 04/13/2024)   1.  Patient will demonstrate independent use of home exercise program to maintain progress from in clinic treatments.  Goal status: Met   LONG TERM GOALS: (target dates for all long term goals is- 07/02/24   1. Patient will demonstrate/report pain at worst less than or equal to  2/10 to facilitate minimal limitation in daily activity secondary to pain symptoms.  Goal status: on going 06/15/24   2. Patient will demonstrate independent use of home exercise program to facilitate ability to maintain/progress functional gains from skilled physical therapy services.  Goal status:  on going 06/15/24   3. Patient will demonstrate Patient specific functional scale avg > or = 6.6 to indicate reduced disability due to condition.   Goal status:  on going 06/15/24   4.  Patient will demonstrate bil LE MMT by >/= 10 lbs using HHD with knee flexion and extension throughout to faciltiate usual transfers, stairs, squatting at PLOF for daily life.   Goal status:Met for lbs number extension. Increase strength in knee flexion but goal ongpoing.   5.  Patient will demonstrate up and down 1 flight of stairs with single hand rail with no pain noted in bilateral knees.  Goal status: Met 04/14/24   6.  Pt will be able to perform full squat using correct body mechanics with pain of </= 2/10.  Goal status:  on going 06/15/24       PLAN:  PT FREQUENCY: 1x /week  PT DURATION: 6 weeks 07/31/25  PLANNED INTERVENTIONS: Can include 02853- PT Re-evaluation, 97110-Therapeutic exercises, 97530- Therapeutic activity, 97112- Neuromuscular re-education, 97535- Self Care, 97140- Manual therapy, [phone]- Gait training, [phone]- Orthotic Fit/training, [phone]- Canalith repositioning, [id number]- Aquatic Therapy, [phone]- Electrical stimulation (unattended), [id number] Physical performance testing, 97016- Vasopneumatic device, [id number]- Ultrasound, [id number]- Traction (mechanical), [id number]- Ionotophoresis 4mg /ml Dexamethasone,  20560 - Needle insertion w/o injection 1 or 2 muscles, 20561 - Needle insertion w/o injection 3 or more muscles.   Patient/Family education, Balance training, Stair training, Taping, Dry Needling, Joint mobilization, Joint manipulation, Spinal manipulation, Spinal mobilization, Scar mobilization, Vestibular  training, Visual/preceptual remediation/compensation, DME instructions, Cryotherapy, and Moist heat.  All performed as medically necessary.  All included unless contraindicated  PLAN FOR NEXT SESSION: Continuing functional LE strengthening, hip stability, dynamic knee stability, begin more agility exercises   Delon Lunger, PT, MPT 06/15/24 9:14 AM   06/15/24  9:14 AM

## 2024-06-16 NOTE — Therapy (Signed)
 OUTPATIENT PHYSICAL THERAPY TREATMENT    Patient Name: Zachary Fischer MRN: [id number] DOB:11-18-1980, 43 y.o., male Today's Date: 05/28/24  END OF SESSION:***           Past Medical History:  Diagnosis Date   Allergy    Past Surgical History:  Procedure Laterality Date   ESOPHAGOGASTRODUODENOSCOPY (EGD) WITH PROPOFOL  N/A 05/13/2019   Procedure: ESOPHAGOGASTRODUODENOSCOPY (EGD) WITH PROPOFOL ;  Surgeon: Therisa Bi, MD;  Location: [facility] ENDOSCOPY;  Service: Gastroenterology;  Laterality: N/A;   NECK SURGERY     08/19 lipoma    Patient Active Problem List   Diagnosis Date Noted   Complete tear of ligament of thumb 12/10/2023   Left thumb sprain 11/26/2023   Pain in right knee 11/08/2023   Esophageal obstruction 05/13/2019   Routine general medical examination at a health care facility 05/29/2013    PCP: Duanne Butler DASEN, MD   REFERRING PROVIDER: Jerri Kay HERO, MD   REFERRING DIAG:  Diagnosis  M25.561,M25.562,G89.29 (ICD-10-CM) - Chronic pain of both knees    THERAPY DIAG:  No diagnosis found.  Rationale for Evaluation and Treatment: Rehabilitation  ONSET DATE: Pt reporting pain has been ongoing for years and gradually wosened   SUBJECTIVE:   SUBJECTIVE STATEMENT:  ***Pt reporting overall he had a pretty good weekend. Pt stating left knee is 6/10, Rt hip 6.5-7/10 at times.   PERTINENT HISTORY: Neck surgery -tumor removal   PAIN:  ***NPRS scale: see above subjective  Pain description: achy Aggravating factors: walking up stairs, transfers after sitting prolonged.  Relieving factors: Tylenol , Advil , Icy-hot   PRECAUTIONS: None   WEIGHT BEARING RESTRICTIONS: No  FALLS:  Has patient fallen in last 6 months? 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 Possible scarring or localized nodular synovitis in the prefemoral fat without associated osseous erosion. 2. No other significant osseous findings are identified. The menisci, cruciate and collateral ligaments are intact. 3. No acute osseous findings.  PATIENT SURVEYS:  Patient-Specific Activity Scoring Scheme  0 represents "unable to perform." 10 represents "able to perform at prior level. 0 1 2 3 4 5 6 7 8 9  10 (Date and Score)   Activity Eval  03/23/24  04/14/24 05/19/24 06/15/24  1. running  4 4  3 3   2. Walking up flights of steps  5 8  5 5   3. Marching for [employer] purposes 5 3 5 5   4.      5.      Score 4.6 5 4.3 4.3   Total score = sum of the activity scores/number of activities Minimum detectable change (90%CI) for average score = 2 points Minimum detectable change (90%CI) for single activity score = 3 points  COGNITION: 03/23/2024 Overall cognitive status: WFL    SENSATION: 03/23/2024 [facility]  MUSCLE LENGTH 04/28/2024: Positive Rt thomas for hip flexor tightness with pain  EDEMA:  03/23/2024 Circumferential: Rt knee: 40 centimeters, Left knee: 39.5 centimeters   LOWER EXTREMITY ROM:   ROM Right Eval 03/23/24 Supine active Left Eval 03/23/24 Supine  active Rt / Left 05/19/24 Supine  active  Hip flexion  Hip extension     Hip abduction     Hip adduction     Hip internal rotation     Hip external rotation     Knee flexion 124 c pain 126  c pain 128 / 125 No pain  Knee extension 0 0 0 / 0   Ankle dorsiflexion     Ankle plantarflexion          Hamstring supine, opposite knee bent 55 52 70 / 60 Pain on the right in pt's low back, QL   (Blank rows = not tested)  LOWER EXTREMITY MMT:  MMT Right Eval 03/23/24  Left Eval 03/23/24  Right 05/05/2024 Left 05/05/2024 Right 05/14/2024 Left  05/14/2024 R/L 05/21/24 Right 06/09/2024 Left 06/09/2024  Hip flexion           Hip extension        3+/5 5/5  Hip abduction        3+/5 4/5  Hip adduction           Hip internal rotation           Hip external rotation           Knee flexion 59.5 lbs 65.2 lbs     72.0/ 60.7    Knee extension 70.7 lbs 65.9 lbs 5/5 81, 83 lbs 5/5 94, 91 5/5 95, 88 lbs 5/5 89, 90 lbs     Ankle dorsiflexion           Ankle plantarflexion           Ankle inversion           Ankle eversion            (Blank rows = not tested)    FUNCTIONAL TESTS:  05/21/24 Rt SLS:60 sec Lt SLS: 60 sec  05/19/2024: Rt SLS: 9 Seconds due to low back pain new onset since last week Lt SLS: 30 seconds and then stopped, no pain noted on left   04/21/2024: Rt SLS: 30 Seconds Lt SLS: 15 seconds  03/23/24:  5 time sit to stand: 19.30 seconds no UE support  GAIT: 05/12/2024: noted antalgic gait with reduced stance on Lt knee.   03/23/2024 Distance walked: clinic distances Assistive device utilized: None Level of assistance: Complete Independence Comments: antalgic, wide BOS, decreased terminal knee extension                                                                                                                                                                       TODAY'S TREATMENT  06/17/24***    06/15/24 TherEx Scifit  bike level 4 for 3 minutes  Side lunges with green TB 2 x 10  TherAct (for facilitation of transfers) Double Leg press: 125# 2 x 15 Single Leg Press: bil 75 2 x 10  TRX squat on Airex mat: 2 x 10  TRX lunges, 2 x 10  bil LE TRX curtseys  2 x 10 each LE  NMR:  SLS: green TB pulled in all directions and then more laterally for adduction strengthening and stability    TODAY'S TREATMENT                                                06/12/24 TherEx Recumbent bike level 3 for 9  minutes  Sidelying clamshells R hip with green band 2 x 10 #4 1x10 Sidelying clamshells L 4# and green T band 1 set each Piriformis stretch bilaterally 25 seconds x 3 bilateral TherAct (for facilitation of transfers) TherAct (for facilitation of transfers) TRX squat, 3x10 2 on air ex TRX lunges, 2x10 bilateral TRX curtseys 10x each LE   Manual Percussion device on gluteus medius Rt                DATE: 06/09/24 TherEx Recumbent bike level 3 for 8.5 minutes  Sidelying clamshells Rt hip with green band and without 2 x 10 Piriformis stretch bilaterally 15/20 seconds x 3 bilateral , done in chair and supine.  PT verbally educated and demo new exercises for updated HEP. Patient verbalized and demo understanding   TherAct (for facilitation of transfers) TRX squat, 3x10 TRX lunges, 2x10 bilateral  Manual Percussion device on gluteus medius Rt  MMT measurements taken    PATIENT EDUCATION:  Education details: HEP, POC Person educated: Patient Education method: Programmer, multimedia, Demonstration, Verbal cues, and Handouts Education comprehension: verbalized understanding, returned demonstration, and verbal cues required  HOME EXERCISE PROGRAM: Access Code: [email or web address] URL: [email or web address] Date: 06/09/2024 Prepared by: Ismael Theophilus Stallion  Exercises - Supine Bridge  - 2 x daily - 7 x weekly - 2 sets - 10 reps - Seated Hamstring Stretch  - 2 x daily - 7 x weekly - 3 reps - 30 seconds hold - Sitting Knee Extension with Resistance  - 2 x daily - 7 x weekly - 2 sets - 10 reps - Seated Quad Set  - 3-5 x daily - 7 x weekly - 1 sets - 10 reps - 5 hold - Seated SLR  - 1-2 x daily - 7 x weekly - 1-2 sets - 10-15 reps - 2 hold - Modified Thomas Stretch  - 2-3 x daily - 7 x weekly - 1 sets - 3-5 reps - 15-30 hold - Prone Quadriceps Stretch with Strap  - 2-3 x daily - 7 x weekly - 1 sets - 3-5 reps - 15-30 hold - Clamshell  - 1-2 x daily - 7 x weekly - 2-3 sets - 10  reps - Supine Piriformis Stretch with Foot on Ground  - 1-2 x daily - 7 x weekly - 2-3 sets - 30 seconds hold  ASSESSMENT:  CLINICAL IMPRESSION:  ****Pt still reporting pain in Rt hip and left knee at times with deep squatting, military drills and functional mobility and  home chores. Pt has made progress with PT with strengthening and ROM since beginning therapy and his treatment is focusing on  functional mobility and maneuvers. I am requesting 6 additional PT visits for 1x/ week over the next 6 weeks to progress toward goals not met.    OBJECTIVE IMPAIRMENTS: difficulty walking, decreased ROM, decreased strength, increased edema, impaired flexibility, and pain.   ACTIVITY LIMITATIONS: lifting, bending, standing, squatting, and stairs  PARTICIPATION LIMITATIONS: community activity and occupation  PERSONAL FACTORS: 1 comorbidity: see PMH are also affecting patient's functional outcome.   REHAB POTENTIAL: Excellent  CLINICAL DECISION MAKING: Stable/uncomplicated  EVALUATION COMPLEXITY: Low   GOALS: Goals reviewed with patient? Yes  SHORT TERM GOALS: (target date for Short term goals are 3 weeks 04/13/2024)   1.  Patient will demonstrate independent use of home exercise program to maintain progress from in clinic treatments.  Goal status: Met   LONG TERM GOALS: (target dates for all long term goals is- 07/02/24   1. Patient will demonstrate/report pain at worst less than or equal to 2/10 to facilitate minimal limitation in daily activity secondary to pain symptoms.  Goal status: on going 06/15/24   2. Patient will demonstrate independent use of home exercise program to facilitate ability to maintain/progress functional gains from skilled physical therapy services.  Goal status:  on going 06/15/24   3. Patient will demonstrate Patient specific functional scale avg > or = 6.6 to indicate reduced disability due to condition.   Goal status:  on going 06/15/24   4.  Patient will  demonstrate bil LE MMT by >/= 10 lbs using HHD with knee flexion and extension throughout to faciltiate usual transfers, stairs, squatting at PLOF for daily life.   Goal status:Met for lbs number extension. Increase strength in knee flexion but goal ongpoing.   5.  Patient will demonstrate up and down 1 flight of stairs with single hand rail with no pain noted in bilateral knees.  Goal status: Met 04/14/24   6.  Pt will be able to perform full squat using correct body mechanics with pain of </= 2/10.  Goal status:  on going 06/15/24       PLAN:  PT FREQUENCY: 1x /week  PT DURATION: 6 weeks 07/31/25  PLANNED INTERVENTIONS: Can include 02853- PT Re-evaluation, 97110-Therapeutic exercises, 97530- Therapeutic activity, 97112- Neuromuscular re-education, 97535- Self Care, 97140- Manual therapy, [phone]- Gait training, [phone]- Orthotic Fit/training, [phone]- Canalith repositioning, [id number]- Aquatic Therapy, [phone]- Electrical stimulation (unattended), [id number] Physical performance testing, 97016- Vasopneumatic device, [id number]- Ultrasound, [id number]- Traction (mechanical), [id number]- Ionotophoresis 4mg /ml Dexamethasone,  20560 - Needle insertion w/o injection 1 or 2 muscles, 20561 - Needle insertion w/o injection 3 or more muscles.   Patient/Family education, Balance training, Stair training, Taping, Dry Needling, Joint mobilization, Joint manipulation, Spinal manipulation, Spinal mobilization, Scar mobilization, Vestibular training, Visual/preceptual remediation/compensation, DME instructions, Cryotherapy, and Moist heat.  All performed as medically necessary.  All included unless contraindicated  PLAN FOR NEXT SESSION: ***Continuing functional LE strengthening, hip stability, dynamic knee stability, begin more agility exercises  Burnard Meth, PT 06/16/24  7:10 AM

## 2024-06-17 ENCOUNTER — Ambulatory Visit (INDEPENDENT_AMBULATORY_CARE_PROVIDER_SITE_OTHER)

## 2024-06-17 DIAGNOSIS — M25561 Pain in right knee: Secondary | ICD-10-CM | POA: Diagnosis not present

## 2024-06-17 DIAGNOSIS — R600 Localized edema: Secondary | ICD-10-CM

## 2024-06-17 DIAGNOSIS — R262 Difficulty in walking, not elsewhere classified: Secondary | ICD-10-CM | POA: Diagnosis not present

## 2024-06-17 DIAGNOSIS — M6281 Muscle weakness (generalized): Secondary | ICD-10-CM

## 2024-06-17 DIAGNOSIS — G8929 Other chronic pain: Secondary | ICD-10-CM

## 2024-06-17 DIAGNOSIS — M25562 Pain in left knee: Secondary | ICD-10-CM

## 2024-06-21 NOTE — Therapy (Signed)
 OUTPATIENT PHYSICAL THERAPY TREATMENT    Patient Name: Zachary Fischer MRN: [id number] DOB:1981/09/17, 43 y.o., male Today's Date: 05/28/24  END OF SESSION:***            Past Medical History:  Diagnosis Date   Allergy    Past Surgical History:  Procedure Laterality Date   ESOPHAGOGASTRODUODENOSCOPY (EGD) WITH PROPOFOL  N/A 05/13/2019   Procedure: ESOPHAGOGASTRODUODENOSCOPY (EGD) WITH PROPOFOL ;  Surgeon: Therisa Bi, MD;  Location: [facility] ENDOSCOPY;  Service: Gastroenterology;  Laterality: N/A;   NECK SURGERY     08/19 lipoma    Patient Active Problem List   Diagnosis Date Noted   Complete tear of ligament of thumb 12/10/2023   Left thumb sprain 11/26/2023   Pain in right knee 11/08/2023   Esophageal obstruction 05/13/2019   Routine general medical examination at a health care facility 05/29/2013    PCP: Duanne Butler DASEN, MD   REFERRING PROVIDER: Jerri Kay HERO, MD   REFERRING DIAG:  Diagnosis  M25.561,M25.562,G89.29 (ICD-10-CM) - Chronic pain of both knees    THERAPY DIAG:  No diagnosis found.  Rationale for Evaluation and Treatment: Rehabilitation  ONSET DATE: Pt reporting pain has been ongoing for years and gradually wosened   SUBJECTIVE:   SUBJECTIVE STATEMENT:  ***Pt reporting R hip still hurting.Has been checked at [facility]. 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 PERTINENT HISTORY: Neck surgery -tumor removal   PAIN:  ***NPRS scale:  hip 4/10, knees 3/10  Pain description: achy Aggravating factors: walking up stairs, transfers after sitting prolonged.  Relieving factors: Tylenol , Advil , Icy-hot   PRECAUTIONS: None   WEIGHT BEARING RESTRICTIONS: No  FALLS:  Has patient fallen in last 6 months? 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 Possible scarring or localized nodular synovitis in the prefemoral fat without associated osseous erosion. 2. No other significant osseous findings are identified. The menisci, cruciate and collateral ligaments are intact. 3. No acute osseous findings.  PATIENT SURVEYS:  Patient-Specific Activity Scoring Scheme  0 represents "unable to perform." 10 represents "able to perform at prior level. 0 1 2 3 4 5 6 7 8 9  10 (Date and Score)   Activity Eval  03/23/24  04/14/24 05/19/24 06/15/24  1. running  4 4  3 3   2. Walking up flights of steps  5 8  5 5   3. Marching for [employer] purposes 5 3 5 5   4.      5.      Score 4.6 5 4.3 4.3   Total score = sum of the activity scores/number of activities Minimum detectable change (90%CI) for average score = 2 points Minimum detectable change (90%CI) for single activity score = 3 points  COGNITION: 03/23/2024 Overall cognitive status: WFL    SENSATION: 03/23/2024 [facility]  MUSCLE LENGTH 04/28/2024: Positive Rt thomas for hip flexor tightness with pain  EDEMA:  03/23/2024 Circumferential: Rt knee: 40 centimeters, Left knee: 39.5 centimeters   LOWER EXTREMITY ROM:   ROM Right Eval 03/23/24 Supine active Left Eval 03/23/24 Supine  active Rt / Left 05/19/24 Supine  active  Hip flexion     Hip extension  Hip abduction     Hip adduction     Hip internal rotation     Hip external rotation     Knee flexion 124 c pain 126  c pain 128 / 125 No pain  Knee extension 0 0 0 / 0   Ankle dorsiflexion     Ankle plantarflexion          Hamstring supine, opposite knee bent 55 52 70 / 60 Pain on the right in pt's low back, QL   (Blank rows = not tested)  LOWER EXTREMITY MMT:  MMT Right Eval 03/23/24  Left Eval 03/23/24 Right 05/05/2024  Left 05/05/2024 Right 05/14/2024 Left  05/14/2024 R/L 05/21/24 Right 06/09/2024 Left 06/09/2024  Hip flexion           Hip extension        3+/5 5/5  Hip abduction        3+/5 4/5  Hip adduction           Hip internal rotation           Hip external rotation           Knee flexion 59.5 lbs 65.2 lbs     72.0/ 60.7    Knee extension 70.7 lbs 65.9 lbs 5/5 81, 83 lbs 5/5 94, 91 5/5 95, 88 lbs 5/5 89, 90 lbs     Ankle dorsiflexion           Ankle plantarflexion           Ankle inversion           Ankle eversion            (Blank rows = not tested)    FUNCTIONAL TESTS:  05/21/24 Rt SLS:60 sec Lt SLS: 60 sec  05/19/2024: Rt SLS: 9 Seconds due to low back pain new onset since last week Lt SLS: 30 seconds and then stopped, no pain noted on left   04/21/2024: Rt SLS: 30 Seconds Lt SLS: 15 seconds  03/23/24:  5 time sit to stand: 19.30 seconds no UE support  GAIT: 05/12/2024: noted antalgic gait with reduced stance on Lt knee.   03/23/2024 Distance walked: clinic distances Assistive device utilized: None Level of assistance: Complete Independence Comments: antalgic, wide BOS, decreased terminal knee extension                                                                                                                                                                       TODAY'S TREATMENT  06/22/24***    06/17/24 TherEx Rec bike level 3 for 9 minutes  Side lunges with green TB 2 x 10  TherAct (for facilitation of transfers) Double Leg press: 125# 2 x 15 Single Leg Press: bil #75 2 x 10  TRX squat on Airex mat: 2 x 10  TRX backward lunges, 2 x 10  bil LE TRX curtseys  2 x 10 each LE  NMR:  SLS: on air ex with perturbations for strengthening and stability    06/15/24 TherEx Scifit  bike level 4 for 3 minutes  Side lunges with green TB 2 x 10  TherAct (for facilitation of transfers) Double Leg press: 125# 2 x 15 Single  Leg Press: bil 75 2 x 10  TRX squat on Airex mat: 2 x 10  TRX lunges, 2 x 10  bil LE TRX curtseys  2 x 10 each LE  NMR:  SLS: green TB pulled in all directions and then more laterally for adduction strengthening and stability    TODAY'S TREATMENT                                                06/12/24 TherEx Recumbent bike level 3 for 9 minutes  Sidelying clamshells R hip with green band 2 x 10 #4 1x10 Sidelying clamshells L 4# and green T band 1 set each Piriformis stretch bilaterally 25 seconds x 3 bilateral TherAct (for facilitation of transfers) TherAct (for facilitation of transfers) TRX squat, 3x10 2 on air ex TRX lunges, 2x10 bilateral TRX curtseys 10x each LE   Manual Percussion device on gluteus medius Rt                DATE: 06/09/24 TherEx Recumbent bike level 3 for 8.5 minutes  Sidelying clamshells Rt hip with green band and without 2 x 10 Piriformis stretch bilaterally 15/20 seconds x 3 bilateral , done in chair and supine.  PT verbally educated and demo new exercises for updated HEP. Patient verbalized and demo understanding   TherAct (for facilitation of transfers) TRX squat, 3x10 TRX lunges, 2x10 bilateral  Manual Percussion device on gluteus medius Rt  MMT measurements taken    PATIENT EDUCATION:  Education details: HEP, POC Person educated: Patient Education method: Programmer, multimedia, Demonstration, Verbal cues, and Handouts Education comprehension: verbalized understanding, returned demonstration, and verbal cues required  HOME EXERCISE PROGRAM: Access Code: [email or web address] URL: [email or web address] Date: 06/09/2024 Prepared by: Ismael Theophilus Stallion  Exercises - Supine Bridge  - 2 x daily - 7 x weekly - 2 sets - 10 reps - Seated Hamstring Stretch  - 2 x daily - 7 x weekly - 3 reps - 30 seconds hold - Sitting Knee Extension with Resistance  - 2 x daily - 7 x weekly - 2 sets - 10 reps - Seated Quad Set  - 3-5 x daily - 7 x weekly - 1  sets - 10 reps - 5 hold - Seated SLR  - 1-2 x daily - 7 x weekly - 1-2 sets - 10-15 reps - 2 hold - Modified Thomas Stretch  - 2-3 x daily - 7 x weekly - 1 sets - 3-5 reps - 15-30 hold - Prone Quadriceps Stretch with Strap  - 2-3 x daily - 7 x weekly - 1 sets - 3-5 reps - 15-30 hold - Clamshell  - 1-2 x daily -  7 x weekly - 2-3 sets - 10 reps - Supine Piriformis Stretch with Foot on Ground  - 1-2 x daily - 7 x weekly - 2-3 sets - 30 seconds hold  ASSESSMENT:  CLINICAL IMPRESSION:  ***Pt challenged by perturbations on airex with occasional need for //bars.  OBJECTIVE IMPAIRMENTS: difficulty walking, decreased ROM, decreased strength, increased edema, impaired flexibility, and pain.   ACTIVITY LIMITATIONS: lifting, bending, standing, squatting, and stairs  PARTICIPATION LIMITATIONS: community activity and occupation  PERSONAL FACTORS: 1 comorbidity: see PMH are also affecting patient's functional outcome.   REHAB POTENTIAL: Excellent  CLINICAL DECISION MAKING: Stable/uncomplicated  EVALUATION COMPLEXITY: Low   GOALS: Goals reviewed with patient? Yes  SHORT TERM GOALS: (target date for Short term goals are 3 weeks 04/13/2024)   1.  Patient will demonstrate independent use of home exercise program to maintain progress from in clinic treatments.  Goal status: Met   LONG TERM GOALS: (target dates for all long term goals is- 07/02/24   1. Patient will demonstrate/report pain at worst less than or equal to 2/10 to facilitate minimal limitation in daily activity secondary to pain symptoms.  Goal status: on going 06/15/24   2. Patient will demonstrate independent use of home exercise program to facilitate ability to maintain/progress functional gains from skilled physical therapy services.  Goal status:  on going 06/15/24   3. Patient will demonstrate Patient specific functional scale avg > or = 6.6 to indicate reduced disability due to condition.   Goal status:  on going 06/15/24    4.  Patient will demonstrate bil LE MMT by >/= 10 lbs using HHD with knee flexion and extension throughout to faciltiate usual transfers, stairs, squatting at PLOF for daily life.   Goal status:Met for lbs number extension. Increase strength in knee flexion but goal ongpoing.   5.  Patient will demonstrate up and down 1 flight of stairs with single hand rail with no pain noted in bilateral knees.  Goal status: Met 04/14/24   6.  Pt will be able to perform full squat using correct body mechanics with pain of </= 2/10.  Goal status:  on going 06/15/24       PLAN:  PT FREQUENCY: 1x /week  PT DURATION: 6 weeks 07/31/25  PLANNED INTERVENTIONS: Can include 02853- PT Re-evaluation, 97110-Therapeutic exercises, 97530- Therapeutic activity, 97112- Neuromuscular re-education, 97535- Self Care, 97140- Manual therapy, [phone]- Gait training, [phone]- Orthotic Fit/training, [phone]- Canalith repositioning, [id number]- Aquatic Therapy, [phone]- Electrical stimulation (unattended), [id number] Physical performance testing, 97016- Vasopneumatic device, [id number]- Ultrasound, [id number]- Traction (mechanical), [id number]- Ionotophoresis 4mg /ml Dexamethasone,  20560 - Needle insertion w/o injection 1 or 2 muscles, 20561 - Needle insertion w/o injection 3 or more muscles.   Patient/Family education, Balance training, Stair training, Taping, Dry Needling, Joint mobilization, Joint manipulation, Spinal manipulation, Spinal mobilization, Scar mobilization, Vestibular training, Visual/preceptual remediation/compensation, DME instructions, Cryotherapy, and Moist heat.  All performed as medically necessary.  All included unless contraindicated  PLAN FOR NEXT SESSION: ***Continuing functional LE strengthening, hip stability, dynamic knee stability, begin more agility exercises  Burnard Meth, PT 06/21/24  10:20 AM

## 2024-06-22 ENCOUNTER — Ambulatory Visit

## 2024-06-22 DIAGNOSIS — R262 Difficulty in walking, not elsewhere classified: Secondary | ICD-10-CM

## 2024-06-22 DIAGNOSIS — R600 Localized edema: Secondary | ICD-10-CM | POA: Diagnosis not present

## 2024-06-22 DIAGNOSIS — M6281 Muscle weakness (generalized): Secondary | ICD-10-CM | POA: Diagnosis not present

## 2024-06-22 DIAGNOSIS — M25561 Pain in right knee: Secondary | ICD-10-CM | POA: Diagnosis not present

## 2024-06-22 DIAGNOSIS — G8929 Other chronic pain: Secondary | ICD-10-CM

## 2024-06-22 DIAGNOSIS — M25562 Pain in left knee: Secondary | ICD-10-CM

## 2024-06-23 NOTE — Therapy (Signed)
 OUTPATIENT PHYSICAL THERAPY TREATMENT    Patient Name: Zachary Fischer MRN: [id number] DOB:1981-09-12, 43 y.o., male Today's Date: 05/28/24  END OF SESSION:***             Past Medical History:  Diagnosis Date   Allergy    Past Surgical History:  Procedure Laterality Date   ESOPHAGOGASTRODUODENOSCOPY (EGD) WITH PROPOFOL  N/A 05/13/2019   Procedure: ESOPHAGOGASTRODUODENOSCOPY (EGD) WITH PROPOFOL ;  Surgeon: Therisa Bi, MD;  Location: [facility] ENDOSCOPY;  Service: Gastroenterology;  Laterality: N/A;   NECK SURGERY     08/19 lipoma    Patient Active Problem List   Diagnosis Date Noted   Complete tear of ligament of thumb 12/10/2023   Left thumb sprain 11/26/2023   Pain in right knee 11/08/2023   Esophageal obstruction 05/13/2019   Routine general medical examination at a health care facility 05/29/2013    PCP: Duanne Butler DASEN, MD   REFERRING PROVIDER: Jerri Kay HERO, MD   REFERRING DIAG:  Diagnosis  M25.561,M25.562,G89.29 (ICD-10-CM) - Chronic pain of both knees    THERAPY DIAG:  No diagnosis found.  Rationale for Evaluation and Treatment: Rehabilitation  ONSET DATE: Pt reporting pain has been ongoing for years and gradually wosened   SUBJECTIVE:   SUBJECTIVE STATEMENT:  ***Pt reporting R hip and low back hurting. Knees getting better. PERTINENT HISTORY: Neck surgery -tumor removal   PAIN:  ***NPRS scale: R hip 6/10, knees 3/10  Pain description: achy Aggravating factors: walking up stairs, transfers after sitting prolonged.  Relieving factors: Tylenol , Advil , Icy-hot   PRECAUTIONS: None   WEIGHT BEARING RESTRICTIONS: No  FALLS:  Has patient fallen in last 6 months? 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 Possible scarring or localized nodular synovitis in the prefemoral fat without associated osseous erosion. 2. No other significant osseous findings are identified. The menisci, cruciate and collateral ligaments are intact. 3. No acute osseous findings.  PATIENT SURVEYS:  Patient-Specific Activity Scoring Scheme  0 represents "unable to perform." 10 represents "able to perform at prior level. 0 1 2 3 4 5 6 7 8 9  10 (Date and Score)   Activity Eval  03/23/24  04/14/24 05/19/24 06/15/24  1. running  4 4  3 3   2. Walking up flights of steps  5 8  5 5   3. Marching for [employer] purposes 5 3 5 5   4.      5.      Score 4.6 5 4.3 4.3   Total score = sum of the activity scores/number of activities Minimum detectable change (90%CI) for average score = 2 points Minimum detectable change (90%CI) for single activity score = 3 points  COGNITION: 03/23/2024 Overall cognitive status: WFL    SENSATION: 03/23/2024 [facility]  MUSCLE LENGTH 04/28/2024: Positive Rt thomas for hip flexor tightness with pain  EDEMA:  03/23/2024 Circumferential: Rt knee: 40 centimeters, Left knee: 39.5 centimeters   LOWER EXTREMITY ROM:   ROM Right Eval 03/23/24 Supine active Left Eval 03/23/24 Supine  active Rt / Left 05/19/24 Supine  active  Hip flexion     Hip extension  Hip abduction     Hip adduction     Hip internal rotation     Hip external rotation     Knee flexion 124 c pain 126  c pain 128 / 125 No pain  Knee extension 0 0 0 / 0   Ankle dorsiflexion     Ankle plantarflexion          Hamstring supine, opposite knee bent 55 52 70 / 60 Pain on the right in pt's low back, QL   (Blank rows = not tested)  LOWER EXTREMITY MMT:  MMT Right Eval 03/23/24  Left Eval 03/23/24 Right 05/05/2024 Left 05/05/2024  Right 05/14/2024 Left  05/14/2024 R/L 05/21/24 Right 06/09/2024 Left 06/09/2024  Hip flexion           Hip extension        3+/5 5/5  Hip abduction        3+/5 4/5  Hip adduction           Hip internal rotation           Hip external rotation           Knee flexion 59.5 lbs 65.2 lbs     72.0/ 60.7    Knee extension 70.7 lbs 65.9 lbs 5/5 81, 83 lbs 5/5 94, 91 5/5 95, 88 lbs 5/5 89, 90 lbs     Ankle dorsiflexion           Ankle plantarflexion           Ankle inversion           Ankle eversion            (Blank rows = not tested)    FUNCTIONAL TESTS:  05/21/24 Rt SLS:60 sec Lt SLS: 60 sec  05/19/2024: Rt SLS: 9 Seconds due to low back pain new onset since last week Lt SLS: 30 seconds and then stopped, no pain noted on left   04/21/2024: Rt SLS: 30 Seconds Lt SLS: 15 seconds  03/23/24:  5 time sit to stand: 19.30 seconds no UE support  GAIT: 05/12/2024: noted antalgic gait with reduced stance on Lt knee.   03/23/2024 Distance walked: clinic distances Assistive device utilized: None Level of assistance: Complete Independence Comments: antalgic, wide BOS, decreased terminal knee extension                                                                                                                                                                       TODAY'S TREATMENT  06/24/24***     06/22/24 TherEx Rec bike level 3 for  Side lunges with green TB 2 x 10  TherAct (for facilitation of transfers) Double Leg press: 125# 2 x 15 Single Leg Press: bil #75 2 x 10  TRX squat on Airex mat: 2 x 10  TRX backward lunges, 2 x 10  bil LE TRX curtseys  2 x 10 each LE  Manual:  R hip stretching  06/17/24 TherEx Rec bike level 3 for 9 minutes  Side lunges with green TB 2 x 10  TherAct (for facilitation of transfers) Double Leg press: 125# 2 x 15 Single Leg Press: bil #75 2 x 10  TRX squat on Airex mat: 2 x 10  TRX  backward lunges, 2 x 10  bil LE TRX curtseys  2 x 10 each LE  NMR:  SLS: on air ex with perturbations for strengthening and stability    06/15/24 TherEx Scifit  bike level 4 for 3 minutes  Side lunges with green TB 2 x 10  TherAct (for facilitation of transfers) Double Leg press: 125# 2 x 15 Single Leg Press: bil 75 2 x 10  TRX squat on Airex mat: 2 x 10  TRX lunges, 2 x 10  bil LE TRX curtseys  2 x 10 each LE  NMR:  SLS: green TB pulled in all directions and then more laterally for adduction strengthening and stability    TODAY'S TREATMENT                                                06/12/24 TherEx Recumbent bike level 3 for 9 minutes  Sidelying clamshells R hip with green band 2 x 10 #4 1x10 Sidelying clamshells L 4# and green T band 1 set each Piriformis stretch bilaterally 25 seconds x 3 bilateral TherAct (for facilitation of transfers) TherAct (for facilitation of transfers) TRX squat, 3x10 2 on air ex TRX lunges, 2x10 bilateral TRX curtseys 10x each LE   Manual Percussion device on gluteus medius Rt                DATE: 06/09/24 TherEx Recumbent bike level 3 for 8.5 minutes  Sidelying clamshells Rt hip with green band and without 2 x 10 Piriformis stretch bilaterally 15/20 seconds x 3 bilateral , done in chair and supine.  PT verbally educated and demo new exercises for updated HEP. Patient verbalized and demo understanding   TherAct (for facilitation of transfers) TRX squat, 3x10 TRX lunges, 2x10 bilateral  Manual Percussion device on gluteus medius Rt  MMT measurements taken    PATIENT EDUCATION:  Education details: HEP, POC Person educated: Patient Education method: Programmer, multimedia, Demonstration, Verbal cues, and Handouts Education comprehension: verbalized understanding, returned demonstration, and verbal cues required  HOME EXERCISE PROGRAM: Access Code: [email or web address] URL: [email or web address] Date: 06/09/2024 Prepared by: Ismael Theophilus Stallion  Exercises - Supine Bridge  - 2 x daily - 7 x weekly - 2 sets - 10 reps - Seated Hamstring Stretch  - 2 x daily - 7 x weekly - 3 reps - 30 seconds hold - Sitting Knee Extension with Resistance  - 2 x daily - 7 x weekly - 2 sets - 10 reps - Seated Quad Set  - 3-5 x daily - 7 x weekly - 1 sets - 10 reps - 5 hold - Seated  SLR  - 1-2 x daily - 7 x weekly - 1-2 sets - 10-15 reps - 2 hold - Modified Thomas Stretch  - 2-3 x daily - 7 x weekly - 1 sets - 3-5 reps - 15-30 hold - Prone Quadriceps Stretch with Strap  - 2-3 x daily - 7 x weekly - 1 sets - 3-5 reps - 15-30 hold - Clamshell  - 1-2 x daily - 7 x weekly - 2-3 sets - 10 reps - Supine Piriformis Stretch with Foot on Ground  - 1-2 x daily - 7 x weekly - 2-3 sets - 30 seconds hold  ASSESSMENT:  CLINICAL IMPRESSION:  ***Pt reported feeling more motion in R LE after manual stretching.  OBJECTIVE IMPAIRMENTS: difficulty walking, decreased ROM, decreased strength, increased edema, impaired flexibility, and pain.   ACTIVITY LIMITATIONS: lifting, bending, standing, squatting, and stairs  PARTICIPATION LIMITATIONS: community activity and occupation  PERSONAL FACTORS: 1 comorbidity: see PMH are also affecting patient's functional outcome.   REHAB POTENTIAL: Excellent  CLINICAL DECISION MAKING: Stable/uncomplicated  EVALUATION COMPLEXITY: Low   GOALS: Goals reviewed with patient? Yes  SHORT TERM GOALS: (target date for Short term goals are 3 weeks 04/13/2024)   1.  Patient will demonstrate independent use of home exercise program to maintain progress from in clinic treatments.  Goal status: Met   LONG TERM GOALS: (target dates for all long term goals is- 07/02/24   1. Patient will demonstrate/report pain at worst less than or equal to 2/10 to facilitate minimal limitation in daily activity secondary to pain symptoms.  Goal status: on going 06/15/24   2. Patient will demonstrate independent use of home exercise  program to facilitate ability to maintain/progress functional gains from skilled physical therapy services.  Goal status:  on going 06/15/24   3. Patient will demonstrate Patient specific functional scale avg > or = 6.6 to indicate reduced disability due to condition.   Goal status:  on going 06/15/24   4.  Patient will demonstrate bil LE MMT by >/= 10 lbs using HHD with knee flexion and extension throughout to faciltiate usual transfers, stairs, squatting at PLOF for daily life.   Goal status:Met for lbs number extension. Increase strength in knee flexion but goal ongpoing.   5.  Patient will demonstrate up and down 1 flight of stairs with single hand rail with no pain noted in bilateral knees.  Goal status: Met 04/14/24   6.  Pt will be able to perform full squat using correct body mechanics with pain of </= 2/10.  Goal status:  on going 06/15/24       PLAN:  PT FREQUENCY: 1x /week  PT DURATION: 6 weeks 07/31/25  PLANNED INTERVENTIONS: Can include 02853- PT Re-evaluation, 97110-Therapeutic exercises, 97530- Therapeutic activity, 97112- Neuromuscular re-education, 97535- Self Care, 97140- Manual therapy, [phone]- Gait training, [phone]- Orthotic Fit/training, [phone]- Canalith repositioning, [id number]- Aquatic Therapy, [phone]- Electrical stimulation (unattended), [id number] Physical performance testing, 97016- Vasopneumatic device, [id number]- Ultrasound, [id number]- Traction (mechanical), [id number]- Ionotophoresis 4mg /ml Dexamethasone,  20560 - Needle insertion w/o injection 1 or 2 muscles, 20561 - Needle insertion w/o injection 3 or more muscles.   Patient/Family education, Balance training, Stair training, Taping, Dry Needling, Joint mobilization, Joint manipulation, Spinal manipulation, Spinal mobilization, Scar mobilization, Vestibular training, Visual/preceptual remediation/compensation, DME instructions, Cryotherapy, and Moist heat.  All performed as medically necessary.  All included unless  contraindicated  PLAN FOR NEXT SESSION: ***Continuing functional LE strengthening, hip stability  Burnard Meth, PT 06/23/24  6:46 AM

## 2024-06-24 ENCOUNTER — Ambulatory Visit (INDEPENDENT_AMBULATORY_CARE_PROVIDER_SITE_OTHER)

## 2024-06-24 DIAGNOSIS — M25562 Pain in left knee: Secondary | ICD-10-CM

## 2024-06-24 DIAGNOSIS — M25561 Pain in right knee: Secondary | ICD-10-CM | POA: Diagnosis not present

## 2024-06-24 DIAGNOSIS — M6281 Muscle weakness (generalized): Secondary | ICD-10-CM

## 2024-06-24 DIAGNOSIS — R262 Difficulty in walking, not elsewhere classified: Secondary | ICD-10-CM | POA: Diagnosis not present

## 2024-06-24 DIAGNOSIS — R600 Localized edema: Secondary | ICD-10-CM

## 2024-06-24 DIAGNOSIS — G8929 Other chronic pain: Secondary | ICD-10-CM

## 2024-06-29 ENCOUNTER — Ambulatory Visit: Admitting: Physical Therapy

## 2024-06-29 ENCOUNTER — Encounter: Payer: Self-pay | Admitting: Physical Therapy

## 2024-06-29 DIAGNOSIS — R600 Localized edema: Secondary | ICD-10-CM | POA: Diagnosis not present

## 2024-06-29 DIAGNOSIS — M25561 Pain in right knee: Secondary | ICD-10-CM | POA: Diagnosis not present

## 2024-06-29 DIAGNOSIS — M6281 Muscle weakness (generalized): Secondary | ICD-10-CM | POA: Diagnosis not present

## 2024-06-29 DIAGNOSIS — G8929 Other chronic pain: Secondary | ICD-10-CM

## 2024-06-29 DIAGNOSIS — R262 Difficulty in walking, not elsewhere classified: Secondary | ICD-10-CM

## 2024-06-29 DIAGNOSIS — M25562 Pain in left knee: Secondary | ICD-10-CM

## 2024-06-29 NOTE — Therapy (Signed)
 OUTPATIENT PHYSICAL THERAPY TREATMENT    Patient Name: Zachary Fischer MRN: [id number] DOB:[birth year], 43 y.o., male Today's Date: 05/28/24  END OF SESSION:   PT End of Session - 06/29/24 0810     Visit Number 20    Number of Visits 22    Date for Recertification  07/31/24    Authorization Type [employer] and [employer]    Progress Note Due on Visit 22    PT Start Time 0805    PT Stop Time 0845    PT Time Calculation (min) 40 min    Activity Tolerance Patient tolerated treatment well    Behavior During Therapy [facility] for tasks assessed/performed             Past Medical History:  Diagnosis Date   Allergy    Past Surgical History:  Procedure Laterality Date   ESOPHAGOGASTRODUODENOSCOPY (EGD) WITH PROPOFOL  N/A 05/13/2019   Procedure: ESOPHAGOGASTRODUODENOSCOPY (EGD) WITH PROPOFOL ;  Surgeon: Therisa Bi, MD;  Location: [facility] ENDOSCOPY;  Service: Gastroenterology;  Laterality: N/A;   NECK SURGERY     08/19 lipoma    Patient Active Problem List   Diagnosis Date Noted   Complete tear of ligament of thumb 12/10/2023   Left thumb sprain 11/26/2023   Pain in right knee 11/08/2023   Esophageal obstruction 05/13/2019   Routine general medical examination at a health care facility 05/29/2013    PCP: Duanne Butler DASEN, MD   REFERRING PROVIDER: Jerri Kay HERO, MD   REFERRING DIAG:  Diagnosis  M25.561,M25.562,G89.29 (ICD-10-CM) - Chronic pain of both knees    THERAPY DIAG:  Bilateral chronic knee pain  Muscle weakness (generalized)  Difficulty in walking, not elsewhere classified  Localized edema  Rationale for Evaluation and Treatment: Rehabilitation  ONSET DATE: Pt reporting pain has been ongoing for years and gradually wosened   SUBJECTIVE:   SUBJECTIVE STATEMENT:  Pt arriving today reporting 1-2/10 pain after climbing the stairs. Pt stating he has an appointment with [facility] for his low back tomorrow.    PERTINENT HISTORY: Neck surgery -tumor removal    PAIN:  NPRS scale:2/10, in his knees  Pain description: achy Aggravating factors: walking up stairs, transfers after sitting prolonged.  Relieving factors: Tylenol , Advil , Icy-hot   PRECAUTIONS: None   WEIGHT BEARING RESTRICTIONS: No  FALLS:  Has patient fallen in last 6 months? 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 Possible scarring or localized nodular synovitis in the prefemoral fat without associated osseous erosion. 2. No other significant osseous findings are identified. The menisci, cruciate and collateral ligaments are intact. 3. No acute osseous findings.  PATIENT SURVEYS:  Patient-Specific Activity Scoring Scheme  0 represents "unable to perform." 10 represents "able to perform at prior level. 0 1 2 3 4 5 6 7 8 9  10 (Date and Score)   Activity Eval  03/23/24  04/14/24 05/19/24 06/15/24  1. running  4 4  3 3   2. Walking up flights of steps  5 8  5 5   3. Marching for [employer] purposes 5 3 5 5   4.      5.  Score 4.6 5 4.3 4.3   Total score = sum of the activity scores/number of activities Minimum detectable change (90%CI) for average score = 2 points Minimum detectable change (90%CI) for single activity score = 3 points  COGNITION: 03/23/2024 Overall cognitive status: WFL    SENSATION: 03/23/2024 [facility]  MUSCLE LENGTH 04/28/2024: Positive Rt thomas for hip flexor tightness with pain  EDEMA:  03/23/2024 Circumferential: Rt knee: 40 centimeters, Left knee: 39.5 centimeters   LOWER EXTREMITY ROM:   ROM Right Eval 03/23/24 Supine active  Left Eval 03/23/24 Supine  active Rt / Left 05/19/24 Supine  active  Hip flexion     Hip extension     Hip abduction     Hip adduction     Hip internal rotation     Hip external rotation     Knee flexion 124 c pain 126  c pain 128 / 125 No pain  Knee extension 0 0 0 / 0   Ankle dorsiflexion     Ankle plantarflexion          Hamstring supine, opposite knee bent 55 52 70 / 60 Pain on the right in pt's low back, QL   (Blank rows = not tested)  LOWER EXTREMITY MMT:  MMT Right Eval 03/23/24  Left Eval 03/23/24 Right 05/05/2024 Left 05/05/2024 Right 05/14/2024 Left  05/14/2024 R/L 05/21/24 Right 06/09/2024 Left 06/09/2024  Hip flexion           Hip extension        3+/5 5/5  Hip abduction        3+/5 4/5  Hip adduction           Hip internal rotation           Hip external rotation           Knee flexion 59.5 lbs 65.2 lbs     72.0/ 60.7    Knee extension 70.7 lbs 65.9 lbs 5/5 81, 83 lbs 5/5 94, 91 5/5 95, 88 lbs 5/5 89, 90 lbs     Ankle dorsiflexion           Ankle plantarflexion           Ankle inversion           Ankle eversion            (Blank rows = not tested)    FUNCTIONAL TESTS:  05/21/24 Rt SLS:60 sec Lt SLS: 60 sec  05/19/2024: Rt SLS: 9 Seconds due to low back pain new onset since last week Lt SLS: 30 seconds and then stopped, no pain noted on left   04/21/2024: Rt SLS: 30 Seconds Lt SLS: 15 seconds  03/23/24:  5 time sit to stand: 19.30 seconds no UE support  GAIT: 05/12/2024: noted antalgic gait with reduced stance on Lt knee.   03/23/2024 Distance walked: clinic distances Assistive device utilized: None Level of assistance: Complete Independence Comments: antalgic, wide BOS, decreased terminal knee extension  TODAY'S TREATMENT                                                 06/29/24 TherEx Recumbent bike: level 3 for 6 minutes  Neuro Re-Ed SLS on BOSU ball some up x3 bil LE holding 1 minute each c finger tap support as needed  TherAct Kettle bell swings: 12# 3 x 10 Lumbar roll squats c 8# x 10  Double Leg Press: 125# 2 x 15  Single Leg press: 75# 2 x 10 bil Y reach x 5 each position c bil LE Single leg dead lifts x 10 bil c 10#     07-09-2024 TherEx Rec bike level 3 for  Side lunges with green TB 2 x 10  TherAct (for facilitation of transfers) Double Leg press: 125# 2 x 15 Single Leg Press: bil #75 2 x 10  TRX squat on Airex mat: 2 x 10  TRX backward lunges, 2 x 10  bil LE TRX curtseys  2 x 10 each LE    06/22/24 TherEx Rec bike level 3 for  Side lunges with green TB 2 x 10  TherAct (for facilitation of transfers) Double Leg press: 125# 2 x 15 Single Leg Press: bil #75 2 x 10  TRX squat on Airex mat: 2 x 10  TRX backward lunges, 2 x 10  bil LE TRX curtseys  2 x 10 each LE  Manual:  R hip stretching   06/17/24 TherEx Rec bike level 3 for 9 minutes  Side lunges with green TB 2 x 10  TherAct (for facilitation of transfers) Double Leg press: 125# 2 x 15 Single Leg Press: bil #75 2 x 10  TRX squat on Airex mat: 2 x 10  TRX backward lunges, 2 x 10  bil LE TRX curtseys  2 x 10 each LE  NMR:  SLS: on air ex with perturbations for strengthening and stability    06/15/24 TherEx Scifit  bike level 4 for 3 minutes  Side lunges with green TB 2 x 10  TherAct (for facilitation of transfers) Double Leg press: 125# 2 x 15 Single Leg Press: bil 75 2 x 10  TRX squat on Airex mat: 2 x 10  TRX lunges, 2 x 10  bil LE TRX curtseys  2 x 10 each LE  NMR:  SLS: green TB pulled in all directions and then more laterally for adduction strengthening and stability     PATIENT EDUCATION:  Education details: HEP, POC Person educated: Patient Education method: Programmer, multimedia, Demonstration, Verbal cues, and Handouts Education  comprehension: verbalized understanding, returned demonstration, and verbal cues required  HOME EXERCISE PROGRAM: Access Code: [email or web address] URL: [email or web address] Date: 06/09/2024 Prepared by: Ismael Theophilus Stallion  Exercises - Supine Bridge  - 2 x daily - 7 x weekly - 2 sets - 10 reps - Seated Hamstring Stretch  - 2 x daily - 7 x weekly - 3 reps - 30 seconds hold - Sitting Knee Extension with Resistance  - 2 x daily - 7 x weekly - 2 sets - 10 reps - Seated Quad Set  - 3-5 x daily - 7 x weekly - 1 sets - 10 reps - 5 hold - Seated SLR  - 1-2 x daily - 7 x weekly - 1-2 sets - 10-15 reps - 2 hold - Modified [place]  Stretch  - 2-3 x daily - 7 x weekly - 1 sets - 3-5 reps - 15-30 hold - Prone Quadriceps Stretch with Strap  - 2-3 x daily - 7 x weekly - 1 sets - 3-5 reps - 15-30 hold - Clamshell  - 1-2 x daily - 7 x weekly - 2-3 sets - 10 reps - Supine Piriformis Stretch with Foot on Ground  - 1-2 x daily - 7 x weekly - 2-3 sets - 30 seconds hold  ASSESSMENT:  CLINICAL IMPRESSION:  Pt arriving today reporting 1-2/10 pain in bil knees when going up and down the stairs. Pt has made improvements  since beginning therapy. Pt still having increased pain with curtseys and single leg dynamic balance.  We discussed discharging in the next two visits if pain levels continue to decrease with functional mobility.      OBJECTIVE IMPAIRMENTS: difficulty walking, decreased ROM, decreased strength, increased edema, impaired flexibility, and pain.   ACTIVITY LIMITATIONS: lifting, bending, standing, squatting, and stairs  PARTICIPATION LIMITATIONS: community activity and occupation  PERSONAL FACTORS: 1 comorbidity: see PMH are also affecting patient's functional outcome.   REHAB POTENTIAL: Excellent  CLINICAL DECISION MAKING: Stable/uncomplicated  EVALUATION COMPLEXITY: Low   GOALS: Goals reviewed with patient? Yes  SHORT TERM GOALS: (target date for Short term goals are 3  weeks 04/13/2024)   1.  Patient will demonstrate independent use of home exercise program to maintain progress from in clinic treatments.  Goal status: Met   LONG TERM GOALS: (target dates for all long term goals is- 07/31/24   1. Patient will demonstrate/report pain at worst less than or equal to 2/10 to facilitate minimal limitation in daily activity secondary to pain symptoms.  Goal status: on going 06/15/24   2. Patient will demonstrate independent use of home exercise program to facilitate ability to maintain/progress functional gains from skilled physical therapy services.  Goal status:  on going 06/15/24   3. Patient will demonstrate Patient specific functional scale avg > or = 6.6 to indicate reduced disability due to condition.   Goal status:  on going 06/15/24   4.  Patient will demonstrate bil LE MMT by >/= 10 lbs using HHD with knee flexion and extension throughout to faciltiate usual transfers, stairs, squatting at PLOF for daily life.   Goal status:Met for lbs number extension. Increase strength in knee flexion but goal ongpoing.   5.  Patient will demonstrate up and down 1 flight of stairs with single hand rail with no pain noted in bilateral knees.  Goal status: Met 04/14/24   6.  Pt will be able to perform full squat using correct body mechanics with pain of </= 2/10.  Goal status:  on going 06/15/24       PLAN:  PT FREQUENCY: 1x /week  PT DURATION: 6 weeks 07/31/25  PLANNED INTERVENTIONS: Can include 02853- PT Re-evaluation, 97110-Therapeutic exercises, 97530- Therapeutic activity, 97112- Neuromuscular re-education, 97535- Self Care, 97140- Manual therapy, [phone]- Gait training, [phone]- Orthotic Fit/training, [phone]- Canalith repositioning, [id number]- Aquatic Therapy, [phone]- Electrical stimulation (unattended), [id number] Physical performance testing, 97016- Vasopneumatic device, [id number]- Ultrasound, [id number]- Traction (mechanical), [id number]- Ionotophoresis 4mg /ml Dexamethasone,  20560 -  Needle insertion w/o injection 1 or 2 muscles, 20561 - Needle insertion w/o injection 3 or more muscles.   Patient/Family education, Balance training, Stair training, Taping, Dry Needling, Joint mobilization, Joint manipulation, Spinal manipulation, Spinal mobilization, Scar mobilization, Vestibular training, Visual/preceptual remediation/compensation, DME instructions, Cryotherapy, and Moist heat.  All performed as medically necessary.  All included unless contraindicated  PLAN FOR NEXT SESSION: Continuing functional LE strengthening, hip stability, low back strengthening Discharge in next 2 visits    Delon Lunger, PT, MPT 06/29/24 8:44 AM   06/29/24  8:44 AM

## 2024-06-30 NOTE — Therapy (Incomplete)
 OUTPATIENT PHYSICAL THERAPY TREATMENT    Patient Name: Zachary Fischer MRN: [id number] DOB:[birth year], 43 y.o., male Today's Date: 05/28/24  END OF SESSION:        Past Medical History:  Diagnosis Date   Allergy    Past Surgical History:  Procedure Laterality Date   ESOPHAGOGASTRODUODENOSCOPY (EGD) WITH PROPOFOL  N/A 05/13/2019   Procedure: ESOPHAGOGASTRODUODENOSCOPY (EGD) WITH PROPOFOL ;  Surgeon: Therisa Bi, MD;  Location: [facility] ENDOSCOPY;  Service: Gastroenterology;  Laterality: N/A;   NECK SURGERY     08/19 lipoma    Patient Active Problem List   Diagnosis Date Noted   Complete tear of ligament of thumb 12/10/2023   Left thumb sprain 11/26/2023   Pain in right knee 11/08/2023   Esophageal obstruction 05/13/2019   Routine general medical examination at a health care facility 05/29/2013    PCP: Duanne Butler DASEN, MD   REFERRING PROVIDER: Jerri Kay HERO, MD   REFERRING DIAG:  Diagnosis  M25.561,M25.562,G89.29 (ICD-10-CM) - Chronic pain of both knees    THERAPY DIAG:  No diagnosis found.  Rationale for Evaluation and Treatment: Rehabilitation  ONSET DATE: Pt reporting pain has been ongoing for years and gradually wosened   SUBJECTIVE:   SUBJECTIVE STATEMENT:  ***Pt arriving today reporting 1-2/10 pain after climbing the stairs. Pt stating he has an appointment with [facility] for his low back tomorrow.    PERTINENT HISTORY: Neck surgery -tumor removal   PAIN:  ***NPRS scale:2/10, in his knees  Pain description: achy Aggravating factors: walking up stairs, transfers after sitting prolonged.  Relieving factors: Tylenol , Advil , Icy-hot   PRECAUTIONS: None   WEIGHT BEARING RESTRICTIONS: No  FALLS:  Has patient fallen in last 6 months? 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 Possible scarring or localized nodular synovitis in the prefemoral fat without associated osseous erosion. 2. No other significant osseous findings are identified. The menisci, cruciate and collateral ligaments are intact. 3. No acute osseous findings.  PATIENT SURVEYS:  Patient-Specific Activity Scoring Scheme  0 represents "unable to perform." 10 represents "able to perform at prior level. 0 1 2 3 4 5 6 7 8 9  10 (Date and Score)   Activity Eval  03/23/24  04/14/24 05/19/24 06/15/24  1. running  4 4  3 3   2. Walking up flights of steps  5 8  5 5   3. Marching for [employer] purposes 5 3 5 5   4.      5.      Score 4.6 5 4.3 4.3   Total score = sum of the activity scores/number of activities Minimum detectable change (90%CI) for average score = 2 points Minimum detectable change (90%CI) for single activity score = 3 points  COGNITION: 03/23/2024 Overall cognitive status: WFL    SENSATION: 03/23/2024 [facility]  MUSCLE LENGTH 04/28/2024: Positive Rt thomas for hip flexor tightness with pain  EDEMA:  03/23/2024 Circumferential: Rt knee: 40 centimeters, Left knee: 39.5 centimeters   LOWER EXTREMITY ROM:   ROM Right Eval 03/23/24 Supine active Left Eval 03/23/24 Supine  active Rt / Left 05/19/24 Supine  active  Hip flexion  Hip extension     Hip abduction     Hip adduction     Hip internal rotation     Hip external rotation     Knee flexion 124 c pain 126  c pain 128 / 125 No pain  Knee extension 0 0 0 / 0   Ankle dorsiflexion     Ankle plantarflexion          Hamstring supine, opposite knee bent 55 52 70 / 60 Pain on the right in pt's low back, QL   (Blank rows = not tested)  LOWER EXTREMITY MMT:  MMT  Right Eval 03/23/24  Left Eval 03/23/24 Right 05/05/2024 Left 05/05/2024 Right 05/14/2024 Left  05/14/2024 R/L 05/21/24 Right 06/09/2024 Left 06/09/2024  Hip flexion           Hip extension        3+/5 5/5  Hip abduction        3+/5 4/5  Hip adduction           Hip internal rotation           Hip external rotation           Knee flexion 59.5 lbs 65.2 lbs     72.0/ 60.7    Knee extension 70.7 lbs 65.9 lbs 5/5 81, 83 lbs 5/5 94, 91 5/5 95, 88 lbs 5/5 89, 90 lbs     Ankle dorsiflexion           Ankle plantarflexion           Ankle inversion           Ankle eversion            (Blank rows = not tested)    FUNCTIONAL TESTS:  05/21/24 Rt SLS:60 sec Lt SLS: 60 sec  05/19/2024: Rt SLS: 9 Seconds due to low back pain new onset since last week Lt SLS: 30 seconds and then stopped, no pain noted on left   04/21/2024: Rt SLS: 30 Seconds Lt SLS: 15 seconds  03/23/24:  5 time sit to stand: 19.30 seconds no UE support  GAIT: 05/12/2024: noted antalgic gait with reduced stance on Lt knee.   03/23/2024 Distance walked: clinic distances Assistive device utilized: None Level of assistance: Complete Independence Comments: antalgic, wide BOS, decreased terminal knee extension                                                                                                                                                                       TODAY'S TREATMENT  07/01/24***      06/29/24 TherEx Recumbent bike: level 3 for 6 minutes  Neuro Re-Ed SLS on BOSU ball some up x3 bil LE holding 1 minute each c finger tap support as needed  TherAct Kettle bell swings: 12# 3 x 10 Lumbar roll squats c 8# x 10  Double Leg Press: 125# 2 x 15  Single Leg press: 75# 2 x 10 bil Y reach x 5 each position c bil LE Single leg dead lifts x 10 bil c 10#     2024-07-09 TherEx Rec bike level 3 for  Side lunges with green TB 2 x 10  TherAct (for  facilitation of transfers) Double Leg press: 125# 2 x 15 Single Leg Press: bil #75 2 x 10  TRX squat on Airex mat: 2 x 10  TRX backward lunges, 2 x 10  bil LE TRX curtseys  2 x 10 each LE    06/22/24 TherEx Rec bike level 3 for  Side lunges with green TB 2 x 10  TherAct (for facilitation of transfers) Double Leg press: 125# 2 x 15 Single Leg Press: bil #75 2 x 10  TRX squat on Airex mat: 2 x 10  TRX backward lunges, 2 x 10  bil LE TRX curtseys  2 x 10 each LE  Manual:  R hip stretching   06/17/24 TherEx Rec bike level 3 for 9 minutes  Side lunges with green TB 2 x 10  TherAct (for facilitation of transfers) Double Leg press: 125# 2 x 15 Single Leg Press: bil #75 2 x 10  TRX squat on Airex mat: 2 x 10  TRX backward lunges, 2 x 10  bil LE TRX curtseys  2 x 10 each LE  NMR:  SLS: on air ex with perturbations for strengthening and stability    06/15/24 TherEx Scifit  bike level 4 for 3 minutes  Side lunges with green TB 2 x 10  TherAct (for facilitation of transfers) Double Leg press: 125# 2 x 15 Single Leg Press: bil 75 2 x 10  TRX squat on Airex mat: 2 x 10  TRX lunges, 2 x 10  bil LE TRX curtseys  2 x 10 each LE  NMR:  SLS: green TB pulled in all directions and then more laterally for adduction strengthening and stability     PATIENT EDUCATION:  Education details: HEP, POC Person educated: Patient Education method: Programmer, multimedia, Demonstration, Verbal cues, and Handouts Education comprehension: verbalized understanding, returned demonstration, and verbal cues required  HOME EXERCISE PROGRAM: Access Code: [email or web address] URL: [email or web address] Date: 06/09/2024 Prepared by: Ismael Theophilus Stallion  Exercises - Supine Bridge  - 2 x daily - 7 x weekly - 2 sets - 10 reps - Seated Hamstring Stretch  - 2 x daily - 7 x weekly - 3 reps - 30 seconds hold - Sitting Knee Extension with Resistance  - 2 x daily - 7 x weekly - 2 sets - 10 reps -  Seated Quad Set  - 3-5 x daily - 7 x weekly - 1 sets - 10 reps - 5 hold - Seated SLR  - 1-2 x daily - 7 x weekly - 1-2 sets - 10-15 reps - 2 hold - Modified Thomas Stretch  - 2-3 x daily - 7 x weekly - 1 sets - 3-5 reps - 15-30 hold - Prone Quadriceps Stretch with Strap  - 2-3 x daily - 7 x weekly - 1 sets - 3-5 reps - 15-30 hold -  Clamshell  - 1-2 x daily - 7 x weekly - 2-3 sets - 10 reps - Supine Piriformis Stretch with Foot on Ground  - 1-2 x daily - 7 x weekly - 2-3 sets - 30 seconds hold  ASSESSMENT:  CLINICAL IMPRESSION:  ***Pt arriving today reporting 1-2/10 pain in bil knees when going up and down the stairs. Pt has made improvements  since beginning therapy. Pt still having increased pain with curtseys and single leg dynamic balance.  We discussed discharging in the next two visits if pain levels continue to decrease with functional mobility.      OBJECTIVE IMPAIRMENTS: difficulty walking, decreased ROM, decreased strength, increased edema, impaired flexibility, and pain.   ACTIVITY LIMITATIONS: lifting, bending, standing, squatting, and stairs  PARTICIPATION LIMITATIONS: community activity and occupation  PERSONAL FACTORS: 1 comorbidity: see PMH are also affecting patient's functional outcome.   REHAB POTENTIAL: Excellent  CLINICAL DECISION MAKING: Stable/uncomplicated  EVALUATION COMPLEXITY: Low   GOALS: Goals reviewed with patient? Yes  SHORT TERM GOALS: (target date for Short term goals are 3 weeks 04/13/2024)   1.  Patient will demonstrate independent use of home exercise program to maintain progress from in clinic treatments.  Goal status: Met   LONG TERM GOALS: (target dates for all long term goals is- 07/31/24   1. Patient will demonstrate/report pain at worst less than or equal to 2/10 to facilitate minimal limitation in daily activity secondary to pain symptoms.  Goal status: on going 06/15/24   2. Patient will demonstrate independent use of home exercise  program to facilitate ability to maintain/progress functional gains from skilled physical therapy services.  Goal status:  on going 06/15/24   3. Patient will demonstrate Patient specific functional scale avg > or = 6.6 to indicate reduced disability due to condition.   Goal status:  on going 06/15/24   4.  Patient will demonstrate bil LE MMT by >/= 10 lbs using HHD with knee flexion and extension throughout to faciltiate usual transfers, stairs, squatting at PLOF for daily life.   Goal status:Met for lbs number extension. Increase strength in knee flexion but goal ongpoing.   5.  Patient will demonstrate up and down 1 flight of stairs with single hand rail with no pain noted in bilateral knees.  Goal status: Met 04/14/24   6.  Pt will be able to perform full squat using correct body mechanics with pain of </= 2/10.  Goal status:  on going 06/15/24       PLAN:  PT FREQUENCY: 1x /week  PT DURATION: 6 weeks 07/31/25  PLANNED INTERVENTIONS: Can include 02853- PT Re-evaluation, 97110-Therapeutic exercises, 97530- Therapeutic activity, 97112- Neuromuscular re-education, 97535- Self Care, 97140- Manual therapy, [phone]- Gait training, [phone]- Orthotic Fit/training, [phone]- Canalith repositioning, [id number]- Aquatic Therapy, [phone]- Electrical stimulation (unattended), [id number] Physical performance testing, 97016- Vasopneumatic device, [id number]- Ultrasound, [id number]- Traction (mechanical), [id number]- Ionotophoresis 4mg /ml Dexamethasone,  20560 - Needle insertion w/o injection 1 or 2 muscles, 20561 - Needle insertion w/o injection 3 or more muscles.   Patient/Family education, Balance training, Stair training, Taping, Dry Needling, Joint mobilization, Joint manipulation, Spinal manipulation, Spinal mobilization, Scar mobilization, Vestibular training, Visual/preceptual remediation/compensation, DME instructions, Cryotherapy, and Moist heat.  All performed as medically necessary.  All included unless  contraindicated  PLAN FOR NEXT SESSION: ***Continuing functional LE strengthening, hip stability, low back strengthening Discharge in next 2 visits

## 2024-07-01 ENCOUNTER — Ambulatory Visit

## 2024-07-01 ENCOUNTER — Encounter

## 2024-07-01 DIAGNOSIS — R600 Localized edema: Secondary | ICD-10-CM

## 2024-07-01 DIAGNOSIS — M6281 Muscle weakness (generalized): Secondary | ICD-10-CM

## 2024-07-01 DIAGNOSIS — G8929 Other chronic pain: Secondary | ICD-10-CM

## 2024-07-01 DIAGNOSIS — M25561 Pain in right knee: Secondary | ICD-10-CM

## 2024-07-01 DIAGNOSIS — R262 Difficulty in walking, not elsewhere classified: Secondary | ICD-10-CM | POA: Diagnosis not present

## 2024-07-01 DIAGNOSIS — M25562 Pain in left knee: Secondary | ICD-10-CM

## 2024-07-01 NOTE — Therapy (Signed)
 OUTPATIENT PHYSICAL THERAPY TREATMENT    Patient Name: Zachary Fischer MRN: [id number] DOB:07-13-1981, 43 y.o., male Today's Date: 05/28/24  END OF SESSION:   PT End of Session - 07/01/24 0956     Visit Number 21    Number of Visits 22    Date for Recertification  07/31/24    PT Start Time 0932    PT Stop Time 1010    PT Time Calculation (min) 38 min              Past Medical History:  Diagnosis Date   Allergy    Past Surgical History:  Procedure Laterality Date   ESOPHAGOGASTRODUODENOSCOPY (EGD) WITH PROPOFOL  N/A 05/13/2019   Procedure: ESOPHAGOGASTRODUODENOSCOPY (EGD) WITH PROPOFOL ;  Surgeon: Therisa Bi, MD;  Location: [facility] ENDOSCOPY;  Service: Gastroenterology;  Laterality: N/A;   NECK SURGERY     08/19 lipoma    Patient Active Problem List   Diagnosis Date Noted   Complete tear of ligament of thumb 12/10/2023   Left thumb sprain 11/26/2023   Pain in right knee 11/08/2023   Esophageal obstruction 05/13/2019   Routine general medical examination at a health care facility 05/29/2013    PCP: Duanne Butler DASEN, MD   REFERRING PROVIDER: Jerri Kay HERO, MD   REFERRING DIAG:  Diagnosis  M25.561,M25.562,G89.29 (ICD-10-CM) - Chronic pain of both knees    THERAPY DIAG:  Bilateral chronic knee pain  Muscle weakness (generalized)  Difficulty in walking, not elsewhere classified  Localized edema  Rationale for Evaluation and Treatment: Rehabilitation  ONSET DATE: Pt reporting pain has been ongoing for years and gradually wosened   SUBJECTIVE:   SUBJECTIVE STATEMENT:  Pt reports for PT stating knees feeling great.  Some LBP today. PERTINENT HISTORY: Neck surgery -tumor removal   PAIN:  NPRS scale: 1/10, in his knees  Pain description: achy Aggravating factors: walking up stairs, transfers after sitting prolonged.  Relieving factors: Tylenol , Advil , Icy-hot   PRECAUTIONS: None   WEIGHT BEARING RESTRICTIONS: No  FALLS:  Has patient fallen in  last 6 months? 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 Possible scarring or localized nodular synovitis in the prefemoral fat without associated osseous erosion. 2. No other significant osseous findings are identified. The menisci, cruciate and collateral ligaments are intact. 3. No acute osseous findings.  PATIENT SURVEYS:  Patient-Specific Activity Scoring Scheme  0 represents "unable to perform." 10 represents "able to perform at prior level. 0 1 2 3 4 5 6 7 8 9  10 (Date and Score)   Activity Eval  03/23/24  04/14/24 05/19/24 06/15/24  1. running  4 4  3 3   2. Walking up flights of steps  5 8  5 5   3. Marching for [employer] purposes 5 3 5 5   4.      5.      Score 4.6 5 4.3 4.3   Total score = sum of the activity scores/number of activities Minimum detectable change (90%CI) for average score = 2 points Minimum detectable change (90%CI) for single activity score = 3 points  COGNITION: 03/23/2024 Overall cognitive status: [facility]  SENSATION: 03/23/2024 [facility]  MUSCLE LENGTH 04/28/2024: Positive Rt thomas for hip flexor tightness with pain  EDEMA:  03/23/2024 Circumferential: Rt knee: 40 centimeters, Left knee: 39.5 centimeters   LOWER EXTREMITY ROM:   ROM Right Eval 03/23/24 Supine active Left Eval 03/23/24 Supine  active Rt / Left 05/19/24 Supine  active  Hip flexion     Hip extension     Hip abduction     Hip adduction     Hip internal rotation     Hip external rotation     Knee flexion 124 c pain 126  c pain 128 / 125 No pain  Knee extension 0 0 0 /  0   Ankle dorsiflexion     Ankle plantarflexion          Hamstring supine, opposite knee bent 55 52 70 / 60 Pain on the right in pt's low back, QL   (Blank rows = not tested)  LOWER EXTREMITY MMT:  MMT Right Eval 03/23/24  Left Eval 03/23/24 Right 05/05/2024 Left 05/05/2024 Right 05/14/2024 Left  05/14/2024 R/L 05/21/24 Right 06/09/2024 Left 06/09/2024  Hip flexion           Hip extension        3+/5 5/5  Hip abduction        3+/5 4/5  Hip adduction           Hip internal rotation           Hip external rotation           Knee flexion 59.5 lbs 65.2 lbs     72.0/ 60.7    Knee extension 70.7 lbs 65.9 lbs 5/5 81, 83 lbs 5/5 94, 91 5/5 95, 88 lbs 5/5 89, 90 lbs     Ankle dorsiflexion           Ankle plantarflexion           Ankle inversion           Ankle eversion            (Blank rows = not tested)    FUNCTIONAL TESTS:  05/21/24 Rt SLS:60 sec Lt SLS: 60 sec  05/19/2024: Rt SLS: 9 Seconds due to low back pain new onset since last week Lt SLS: 30 seconds and then stopped, no pain noted on left   04/21/2024: Rt SLS: 30 Seconds Lt SLS: 15 seconds  03/23/24:  5 time sit to stand: 19.30 seconds no UE support  GAIT: 05/12/2024: noted antalgic gait with reduced stance on Lt knee.   03/23/2024 Distance walked: clinic distances Assistive device utilized: None Level of assistance: Complete Independence Comments: antalgic, wide BOS, decreased terminal knee extension  TODAY'S TREATMENT                                                07/01/24 TherEx Recumbent bike: level 3 for 8 minutes  Neuro Re-Ed SLS on BOSU ball some up x3 bil LE holding 1 minute each c finger tap support as needed  TherAct Kettle bell swings: 12# 3 x 10 Double Leg Press: 125# 2 x 15  Single Leg press: 75# 2 x 10 bil Single leg dead  lifts x 10 bil c 10#      06/29/24 TherEx Recumbent bike: level 3 for 6 minutes  Neuro Re-Ed SLS on BOSU ball some up x3 bil LE holding 1 minute each c finger tap support as needed  TherAct Kettle bell swings: 12# 3 x 10 Lumbar roll squats c 8# x 10  Double Leg Press: 125# 2 x 15  Single Leg press: 75# 2 x 10 bil Y reach x 5 each position c bil LE Single leg dead lifts x 10 bil c 10#     July 24, 2024 TherEx Rec bike level 3 for  Side lunges with green TB 2 x 10  TherAct (for facilitation of transfers) Double Leg press: 125# 2 x 15 Single Leg Press: bil #75 2 x 10  TRX squat on Airex mat: 2 x 10  TRX backward lunges, 2 x 10  bil LE TRX curtseys  2 x 10 each LE    06/22/24 TherEx Rec bike level 3 for  Side lunges with green TB 2 x 10  TherAct (for facilitation of transfers) Double Leg press: 125# 2 x 15 Single Leg Press: bil #75 2 x 10  TRX squat on Airex mat: 2 x 10  TRX backward lunges, 2 x 10  bil LE TRX curtseys  2 x 10 each LE  Manual:  R hip stretching   06/17/24 TherEx Rec bike level 3 for 9 minutes  Side lunges with green TB 2 x 10  TherAct (for facilitation of transfers) Double Leg press: 125# 2 x 15 Single Leg Press: bil #75 2 x 10  TRX squat on Airex mat: 2 x 10  TRX backward lunges, 2 x 10  bil LE TRX curtseys  2 x 10 each LE  NMR:  SLS: on air ex with perturbations for strengthening and stability    06/15/24 TherEx Scifit  bike level 4 for 3 minutes  Side lunges with green TB 2 x 10  TherAct (for facilitation of transfers) Double Leg press: 125# 2 x 15 Single Leg Press: bil 75 2 x 10  TRX squat on Airex mat: 2 x 10  TRX lunges, 2 x 10  bil LE TRX curtseys  2 x 10 each LE  NMR:  SLS: green TB pulled in all directions and then more laterally for adduction strengthening and stability     PATIENT EDUCATION:  Education details: HEP, POC Person educated: Patient Education method: Programmer, multimedia, Demonstration, Verbal cues,  and Handouts Education comprehension: verbalized understanding, returned demonstration, and verbal cues required  HOME EXERCISE PROGRAM: Access Code: [email or web address] URL: [email or web address] Date: 06/09/2024 Prepared by: Ismael Theophilus Stallion  Exercises - Supine Bridge  - 2 x daily - 7 x weekly - 2 sets - 10 reps - Seated Hamstring Stretch  - 2 x daily - 7 x weekly - 3 reps -  30 seconds hold - Sitting Knee Extension with Resistance  - 2 x daily - 7 x weekly - 2 sets - 10 reps - Seated Quad Set  - 3-5 x daily - 7 x weekly - 1 sets - 10 reps - 5 hold - Seated SLR  - 1-2 x daily - 7 x weekly - 1-2 sets - 10-15 reps - 2 hold - Modified Thomas Stretch  - 2-3 x daily - 7 x weekly - 1 sets - 3-5 reps - 15-30 hold - Prone Quadriceps Stretch with Strap  - 2-3 x daily - 7 x weekly - 1 sets - 3-5 reps - 15-30 hold - Clamshell  - 1-2 x daily - 7 x weekly - 2-3 sets - 10 reps - Supine Piriformis Stretch with Foot on Ground  - 1-2 x daily - 7 x weekly - 2-3 sets - 30 seconds hold  ASSESSMENT:  CLINICAL IMPRESSION:  Pt arriving today reporting minimal to no knee pain.  Needed VC and hand/bar support with balance work on [employer].   OBJECTIVE IMPAIRMENTS: difficulty walking, decreased ROM, decreased strength, increased edema, impaired flexibility, and pain.   ACTIVITY LIMITATIONS: lifting, bending, standing, squatting, and stairs  PARTICIPATION LIMITATIONS: community activity and occupation  PERSONAL FACTORS: 1 comorbidity: see PMH are also affecting patient's functional outcome.   REHAB POTENTIAL: Excellent  CLINICAL DECISION MAKING: Stable/uncomplicated  EVALUATION COMPLEXITY: Low   GOALS: Goals reviewed with patient? Yes  SHORT TERM GOALS: (target date for Short term goals are 3 weeks 04/13/2024)   1.  Patient will demonstrate independent use of home exercise program to maintain progress from in clinic treatments.  Goal status: Met   LONG TERM GOALS: (target dates for  all long term goals is- 07/31/24   1. Patient will demonstrate/report pain at worst less than or equal to 2/10 to facilitate minimal limitation in daily activity secondary to pain symptoms.  Goal status: on going 06/15/24   2. Patient will demonstrate independent use of home exercise program to facilitate ability to maintain/progress functional gains from skilled physical therapy services.  Goal status:  on going 06/15/24   3. Patient will demonstrate Patient specific functional scale avg > or = 6.6 to indicate reduced disability due to condition.   Goal status:  on going 06/15/24   4.  Patient will demonstrate bil LE MMT by >/= 10 lbs using HHD with knee flexion and extension throughout to faciltiate usual transfers, stairs, squatting at PLOF for daily life.   Goal status:Met for lbs number extension. Increase strength in knee flexion but goal ongpoing.   5.  Patient will demonstrate up and down 1 flight of stairs with single hand rail with no pain noted in bilateral knees.  Goal status: Met 04/14/24   6.  Pt will be able to perform full squat using correct body mechanics with pain of </= 2/10.  Goal status:  on going 06/15/24       PLAN:  PT FREQUENCY: 1x /week  PT DURATION: 6 weeks 07/31/25  PLANNED INTERVENTIONS: Can include 02853- PT Re-evaluation, 97110-Therapeutic exercises, 97530- Therapeutic activity, 97112- Neuromuscular re-education, 97535- Self Care, 97140- Manual therapy, [phone]- Gait training, [phone]- Orthotic Fit/training, [phone]- Canalith repositioning, [id number]- Aquatic Therapy, [phone]- Electrical stimulation (unattended), [id number] Physical performance testing, 97016- Vasopneumatic device, [id number]- Ultrasound, [id number]- Traction (mechanical), [id number]- Ionotophoresis 4mg /ml Dexamethasone,  20560 - Needle insertion w/o injection 1 or 2 muscles, 20561 - Needle insertion w/o injection 3 or more muscles.   Patient/Family education, Balance training,  Stair training, Taping, Dry Needling, Joint  mobilization, Joint manipulation, Spinal manipulation, Spinal mobilization, Scar mobilization, Vestibular training, Visual/preceptual remediation/compensation, DME instructions, Cryotherapy, and Moist heat.  All performed as medically necessary.  All included unless contraindicated  PLAN FOR NEXT SESSION: DC next visit.

## 2024-07-07 ENCOUNTER — Ambulatory Visit (INDEPENDENT_AMBULATORY_CARE_PROVIDER_SITE_OTHER): Admitting: Physical Therapy

## 2024-07-07 ENCOUNTER — Encounter: Payer: Self-pay | Admitting: Physical Therapy

## 2024-07-07 DIAGNOSIS — M25561 Pain in right knee: Secondary | ICD-10-CM

## 2024-07-07 DIAGNOSIS — R600 Localized edema: Secondary | ICD-10-CM

## 2024-07-07 DIAGNOSIS — G8929 Other chronic pain: Secondary | ICD-10-CM

## 2024-07-07 DIAGNOSIS — R262 Difficulty in walking, not elsewhere classified: Secondary | ICD-10-CM | POA: Diagnosis not present

## 2024-07-07 DIAGNOSIS — M6281 Muscle weakness (generalized): Secondary | ICD-10-CM

## 2024-07-07 DIAGNOSIS — M25562 Pain in left knee: Secondary | ICD-10-CM

## 2024-07-07 NOTE — Therapy (Signed)
 OUTPATIENT PHYSICAL THERAPY TREATMENT Discharge    Patient Name: Zachary Fischer MRN: [id number] DOB:10-02-1980, 43 y.o., male Today's Date: 05/28/24  END OF SESSION:   PT End of Session - 07/07/24 1110     Visit Number 22    Number of Visits 22    Date for Recertification  07/31/24    Authorization Type [employer] and [employer]    Progress Note Due on Visit 22    PT Start Time 0845    PT Stop Time 0925    PT Time Calculation (min) 40 min    Activity Tolerance Patient tolerated treatment well    Behavior During Therapy [facility] for tasks assessed/performed               Past Medical History:  Diagnosis Date   Allergy    Past Surgical History:  Procedure Laterality Date   ESOPHAGOGASTRODUODENOSCOPY (EGD) WITH PROPOFOL  N/A 05/13/2019   Procedure: ESOPHAGOGASTRODUODENOSCOPY (EGD) WITH PROPOFOL ;  Surgeon: Therisa Bi, MD;  Location: [facility] ENDOSCOPY;  Service: Gastroenterology;  Laterality: N/A;   NECK SURGERY     08/19 lipoma    Patient Active Problem List   Diagnosis Date Noted   Complete tear of ligament of thumb 12/10/2023   Left thumb sprain 11/26/2023   Pain in right knee 11/08/2023   Esophageal obstruction 05/13/2019   Routine general medical examination at a health care facility 05/29/2013    PCP: Duanne Butler DASEN, MD   REFERRING PROVIDER: Jerri Kay HERO, MD   REFERRING DIAG:  Diagnosis  M25.561,M25.562,G89.29 (ICD-10-CM) - Chronic pain of both knees    THERAPY DIAG:  Bilateral chronic knee pain  Muscle weakness (generalized)  Difficulty in walking, not elsewhere classified  Localized edema  Rationale for Evaluation and Treatment: Rehabilitation  ONSET DATE: Pt reporting pain has been ongoing for years and gradually wosened   SUBJECTIVE:   SUBJECTIVE STATEMENT:  Pt feels like he is more limited by his low back and Rt hip pain than his knees.    PERTINENT HISTORY: Neck surgery -tumor removal   PAIN:  NPRS scale: 1/10, in his knees  Pain  description: achy Aggravating factors: walking up stairs, transfers after sitting prolonged.  Relieving factors: Tylenol , Advil , Icy-hot   PRECAUTIONS: None   WEIGHT BEARING RESTRICTIONS: No  FALLS:  Has patient fallen in last 6 months? 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 Possible scarring or localized nodular synovitis in the prefemoral fat without associated osseous erosion. 2. No other significant osseous findings are identified. The menisci, cruciate and collateral ligaments are intact. 3. No acute osseous findings.  PATIENT SURVEYS:  Patient-Specific Activity Scoring Scheme  0 represents "unable to perform." 10 represents "able to perform at prior level. 0 1 2 3 4 5 6 7 8 9  10 (Date and Score)   Activity Eval  03/23/24  04/14/24 05/19/24 06/15/24 07/07/24  1. running  4 4  3 3 2   2. Walking up flights of steps  5 8  5 5 8   3. Marching for [employer] purposes 5 3 5 5 4   4.  5.       Score 4.6 5 4.3 4.3 4.6   Total score = sum of the activity scores/number of activities Minimum detectable change (90%CI) for average score = 2 points Minimum detectable change (90%CI) for single activity score = 3 points  COGNITION: 03/23/2024 Overall cognitive status: WFL    SENSATION: 03/23/2024 [facility]  MUSCLE LENGTH 04/28/2024: Positive Rt thomas for hip flexor tightness with pain  EDEMA:  03/23/2024 Circumferential: Rt knee: 40 centimeters, Left knee: 39.5 centimeters   LOWER EXTREMITY ROM:   ROM Right Eval 03/23/24 Supine active Left Eval 03/23/24 Supine   active Rt / Left 05/19/24 Supine  active Rt / left 07/07/24 Supine active  Hip flexion      Hip extension      Hip abduction      Hip adduction      Hip internal rotation      Hip external rotation      Knee flexion 124 c pain 126  c pain 128 / 125 No pain 128 / 128 No pain  Knee extension 0 0 0 / 0  0 / 0   Ankle dorsiflexion      Ankle plantarflexion            Hamstring supine, opposite knee bent 55 52 70 / 60 Pain on the right in pt's low back, QL    (Blank rows = not tested)  LOWER EXTREMITY MMT:  MMT Right Eval 03/23/24  Left Eval 03/23/24 Right 05/05/2024 Left 05/05/2024 Right 05/14/2024 Left  05/14/2024 R/L 05/21/24 Right 06/09/2024 Left 06/09/2024 Rt  07/07/24 Left 07/07/24  Hip flexion             Hip extension        3+/5 5/5    Hip abduction        3+/5 4/5    Hip adduction             Hip internal rotation             Hip external rotation             Knee flexion 59.5 lbs 65.2 lbs     72.0/ 60.7   78.4, lbs 73.3 lbs 73.7, 78 lbs  Knee extension 70.7 lbs 65.9 lbs 5/5 81, 83 lbs 5/5 94, 91 5/5 95, 88 lbs 5/5 89, 90 lbs    93.2, 92.4 92.5 lbs 88.3 lbs  Ankle dorsiflexion             Ankle plantarflexion             Ankle inversion             Ankle eversion              (Blank rows = not tested)    FUNCTIONAL TESTS:  05/21/24 Rt SLS:60 sec Lt SLS: 60 sec  05/19/2024: Rt SLS: 9 Seconds due to low back pain new onset since last week Lt SLS: 30 seconds and then stopped, no pain noted on left   04/21/2024: Rt SLS: 30 Seconds Lt SLS: 15 seconds  03/23/24:  5 time sit to stand: 19.30 seconds no UE support  GAIT: 05/12/2024: noted antalgic gait with reduced stance on Lt knee.   03/23/2024 Distance walked: clinic distances Assistive device utilized: None Level of assistance: Complete Independence Comments: antalgic, wide BOS, decreased terminal knee extension  TODAY'S TREATMENT                                                07/07/24 TherEx:  Updated ROM and MMT Updated HEP and reviewed each exercises. Also adding gym equipment exercises with instructions in reps and wt progression     TODAY'S TREATMENT                                                07/01/24 TherEx Recumbent bike: level 3 for 8 minutes  Neuro Re-Ed SLS on BOSU ball some up x3 bil LE holding 1 minute each c finger tap support as needed  TherAct Kettle bell swings: 12# 3 x 10 Double Leg Press: 125# 2 x 15  Single Leg press: 75# 2 x 10 bil Single leg dead lifts x 10 bil c 10#      06/29/24 TherEx Recumbent bike: level 3 for 6 minutes  Neuro Re-Ed SLS on BOSU ball some up x3 bil LE holding 1 minute each c finger tap support as needed  TherAct Kettle bell swings: 12# 3 x 10 Lumbar roll squats c 8# x 10  Double Leg Press: 125# 2 x 15  Single Leg press: 75# 2 x 10 bil Y reach x 5 each position c bil LE Single leg dead lifts x 10 bil c 10#     07/20/2024 TherEx Rec bike level 3 for  Side lunges with green TB 2 x 10  TherAct (for facilitation of transfers) Double Leg press: 125# 2 x 15 Single Leg Press: bil #75 2 x 10  TRX squat on Airex mat: 2 x 10  TRX backward lunges, 2 x 10  bil LE TRX curtseys  2 x 10 each LE    06/22/24 TherEx Rec bike level 3 for  Side lunges with green TB 2 x 10  TherAct (for facilitation of transfers) Double Leg press: 125# 2 x 15 Single Leg Press: bil #75 2 x 10  TRX squat on Airex mat: 2 x 10  TRX backward lunges, 2 x 10  bil LE TRX curtseys  2 x 10 each LE  Manual:  R hip stretching   06/17/24 TherEx Rec bike level 3 for 9 minutes  Side lunges with green TB 2 x 10  TherAct (for facilitation of transfers) Double Leg press: 125# 2 x 15 Single Leg Press: bil #75 2 x 10  TRX squat on Airex mat: 2 x 10  TRX backward  lunges, 2 x 10  bil LE TRX curtseys  2 x 10 each LE  NMR:  SLS: on air ex with perturbations for strengthening and stability    06/15/24 TherEx Scifit  bike level 4 for 3 minutes  Side lunges with green TB 2 x 10  TherAct (for facilitation of transfers) Double Leg press: 125# 2 x 15 Single Leg Press: bil 75 2 x 10  TRX squat on Airex mat: 2 x 10  TRX lunges, 2 x 10  bil LE TRX curtseys  2 x 10 each LE  NMR:  SLS: green TB pulled in all directions and then more laterally for adduction strengthening and stability     PATIENT EDUCATION:  Education  details: HEP, POC Person educated: Patient Education method: Explanation, Demonstration, Verbal cues, and Handouts Education comprehension: verbalized understanding, returned demonstration, and verbal cues required  HOME EXERCISE PROGRAM: Access Code: [email or web address] URL: [email or web address] Date: 06/09/2024 Prepared by: Ismael Theophilus Stallion  Exercises - Supine Bridge  - 2 x daily - 7 x weekly - 2 sets - 10 reps - Seated Hamstring Stretch  - 2 x daily - 7 x weekly - 3 reps - 30 seconds hold - Sitting Knee Extension with Resistance  - 2 x daily - 7 x weekly - 2 sets - 10 reps - Seated Quad Set  - 3-5 x daily - 7 x weekly - 1 sets - 10 reps - 5 hold - Seated SLR  - 1-2 x daily - 7 x weekly - 1-2 sets - 10-15 reps - 2 hold - Modified Thomas Stretch  - 2-3 x daily - 7 x weekly - 1 sets - 3-5 reps - 15-30 hold - Prone Quadriceps Stretch with Strap  - 2-3 x daily - 7 x weekly - 1 sets - 3-5 reps - 15-30 hold - Clamshell  - 1-2 x daily - 7 x weekly - 2-3 sets - 10 reps - Supine Piriformis Stretch with Foot on Ground  - 1-2 x daily - 7 x weekly - 2-3 sets - 30 seconds hold  ASSESSMENT:  CLINICAL IMPRESSION:  Pt has currently met 5 out of 6 of his LTGs set at his initial evaluation. Pt is being discharged from skilled PT for pt's bilateral knee pain.   OBJECTIVE IMPAIRMENTS: difficulty walking, decreased ROM,  decreased strength, increased edema, impaired flexibility, and pain.   ACTIVITY LIMITATIONS: lifting, bending, standing, squatting, and stairs  PARTICIPATION LIMITATIONS: community activity and occupation  PERSONAL FACTORS: 1 comorbidity: see PMH are also affecting patient's functional outcome.   REHAB POTENTIAL: Excellent  CLINICAL DECISION MAKING: Stable/uncomplicated  EVALUATION COMPLEXITY: Low   GOALS: Goals reviewed with patient? Yes  SHORT TERM GOALS: (target date for Short term goals are 3 weeks 04/13/2024)   1.  Patient will demonstrate independent use of home exercise program to maintain progress from in clinic treatments.  Goal status: Met   LONG TERM GOALS: (target dates for all long term goals is- 07/31/24   1. Patient will demonstrate/report pain at worst less than or equal to 2/10 to facilitate minimal limitation in daily activity secondary to pain symptoms.  Goal status: 07/07/24 MET   2. Patient will demonstrate independent use of home exercise program to facilitate ability to maintain/progress functional gains from skilled physical therapy services.  Goal status:   07/07/24 MET   3. Patient will demonstrate Patient specific functional scale avg > or = 6.6 to indicate reduced disability due to condition.   Goal status:  NOT MET 07/07/24   4.  Patient will demonstrate bil LE MMT by >/= 10 lbs using HHD with knee flexion and extension throughout to faciltiate usual transfers, stairs, squatting at PLOF for daily life.   Goal status: Met 07/07/24   5.  Patient will demonstrate up and down 1 flight of stairs with single hand rail with no pain noted in bilateral knees.  Goal status: Met 04/14/24   6.  Pt will be able to perform full squat using correct body mechanics with pain of </= 2/10.  Goal status:   07/07/24 MET      PLAN:  PT FREQUENCY: 1x /week  PT DURATION: 6 weeks 07/31/25  PLANNED INTERVENTIONS: Can include 02853- PT Re-evaluation,  97110-Therapeutic exercises, 97530- Therapeutic activity, [id number]- Neuromuscular re-education, [phone]- Self Care, 02859- Manual therapy, [phone]- Gait training, [phone]- Orthotic Fit/training, [phone]- Canalith repositioning, [id number]- Aquatic Therapy, [phone]- Electrical stimulation (unattended), [id number] Physical performance testing, 97016- Vasopneumatic device, [id number]- Ultrasound, [id number]- Traction (mechanical), [id number]- Ionotophoresis 4mg /ml Dexamethasone,  20560 - Needle insertion w/o injection 1 or 2 muscles, 20561 - Needle insertion w/o injection 3 or more muscles.   Patient/Family education, Balance training, Stair training, Taping, Dry Needling, Joint mobilization, Joint manipulation, Spinal manipulation, Spinal mobilization, Scar mobilization, Vestibular training, Visual/preceptual remediation/compensation, DME instructions, Cryotherapy, and Moist heat.  All performed as medically necessary.  All included unless contraindicated  PHYSICAL THERAPY DISCHARGE SUMMARY  Visits from Start of Care: 22  Current functional level related to goals / functional outcomes: See above   Remaining deficits: See above   Education / Equipment: See above   Patient agrees to discharge. Patient goals were 5 of 6 goals met. Patient is being discharged due to being pleased with the current functional level.

## 2024-07-14 ENCOUNTER — Encounter: Admitting: Physical Therapy

## 2024-07-21 ENCOUNTER — Encounter: Admitting: Physical Therapy

## 2024-07-27 ENCOUNTER — Encounter: Payer: Self-pay | Admitting: Radiology

## 2024-07-28 ENCOUNTER — Encounter: Admitting: Physical Therapy

## 2024-07-29 ENCOUNTER — Encounter: Payer: Self-pay | Admitting: Orthopaedic Surgery
